# Patient Record
Sex: Female | Born: 1945 | Race: White | Hispanic: No | Marital: Married | Smoking: Never smoker
Health system: Southern US, Community
[De-identification: ages and names within clinical notes are randomized; demographics above are authoritative.]

## PROBLEM LIST (undated history)

## (undated) DIAGNOSIS — G43909 Migraine, unspecified, not intractable, without status migrainosus: Secondary | ICD-10-CM

## (undated) DIAGNOSIS — K219 Gastro-esophageal reflux disease without esophagitis: Secondary | ICD-10-CM

## (undated) DIAGNOSIS — G894 Chronic pain syndrome: Secondary | ICD-10-CM

## (undated) DIAGNOSIS — M542 Cervicalgia: Secondary | ICD-10-CM

## (undated) DIAGNOSIS — M170 Bilateral primary osteoarthritis of knee: Secondary | ICD-10-CM

## (undated) DIAGNOSIS — M25561 Pain in right knee: Secondary | ICD-10-CM

## (undated) DIAGNOSIS — F329 Major depressive disorder, single episode, unspecified: Secondary | ICD-10-CM

## (undated) DIAGNOSIS — F419 Anxiety disorder, unspecified: Secondary | ICD-10-CM

## (undated) DIAGNOSIS — M5030 Other cervical disc degeneration, unspecified cervical region: Secondary | ICD-10-CM

## (undated) DIAGNOSIS — G4700 Insomnia, unspecified: Secondary | ICD-10-CM

## (undated) DIAGNOSIS — M5136 Other intervertebral disc degeneration, lumbar region: Secondary | ICD-10-CM

## (undated) DIAGNOSIS — F411 Generalized anxiety disorder: Secondary | ICD-10-CM

## (undated) DIAGNOSIS — M810 Age-related osteoporosis without current pathological fracture: Secondary | ICD-10-CM

## (undated) DIAGNOSIS — I7300 Raynaud's syndrome without gangrene: Secondary | ICD-10-CM

## (undated) DIAGNOSIS — E782 Mixed hyperlipidemia: Secondary | ICD-10-CM

## (undated) DIAGNOSIS — R1310 Dysphagia, unspecified: Secondary | ICD-10-CM

## (undated) DIAGNOSIS — Z201 Contact with and (suspected) exposure to tuberculosis: Secondary | ICD-10-CM

## (undated) DIAGNOSIS — M1990 Unspecified osteoarthritis, unspecified site: Secondary | ICD-10-CM

## (undated) DIAGNOSIS — I10 Essential (primary) hypertension: Secondary | ICD-10-CM

## (undated) DIAGNOSIS — G8929 Other chronic pain: Secondary | ICD-10-CM

## (undated) DIAGNOSIS — I200 Unstable angina: Secondary | ICD-10-CM

## (undated) DIAGNOSIS — Z8739 Personal history of other diseases of the musculoskeletal system and connective tissue: Secondary | ICD-10-CM

## (undated) DIAGNOSIS — M549 Dorsalgia, unspecified: Secondary | ICD-10-CM

## (undated) DIAGNOSIS — H73819 Atrophic flaccid tympanic membrane, unspecified ear: Secondary | ICD-10-CM

## (undated) DIAGNOSIS — M25562 Pain in left knee: Secondary | ICD-10-CM

## (undated) DIAGNOSIS — H539 Unspecified visual disturbance: Secondary | ICD-10-CM

## (undated) DIAGNOSIS — F32A Depression, unspecified: Secondary | ICD-10-CM

## (undated) DIAGNOSIS — G43109 Migraine with aura, not intractable, without status migrainosus: Secondary | ICD-10-CM

## (undated) DIAGNOSIS — R0789 Other chest pain: Secondary | ICD-10-CM

## (undated) HISTORY — DX: Bilateral primary osteoarthritis of knee: M17.0

## (undated) HISTORY — DX: Essential (primary) hypertension: I10

## (undated) HISTORY — DX: Unspecified osteoarthritis, unspecified site: M19.90

## (undated) HISTORY — PX: CARDIAC CATHETERIZATION: SHX172

## (undated) HISTORY — DX: Mixed hyperlipidemia: E78.2

## (undated) HISTORY — PX: CORONARY ANGIOPLASTY: SHX604

## (undated) HISTORY — DX: Dysphagia, unspecified: R13.10

## (undated) HISTORY — DX: Cervicalgia: M54.2

## (undated) HISTORY — DX: Insomnia, unspecified: G47.00

## (undated) HISTORY — PX: JOINT REPLACEMENT: SHX530

## (undated) HISTORY — DX: Atrophic flaccid tympanic membrane, unspecified ear: H73.819

## (undated) HISTORY — DX: Pain in left knee: M25.562

## (undated) HISTORY — DX: Other chronic pain: G89.29

## (undated) HISTORY — PX: MASTOIDECTOMY: SHX711

## (undated) HISTORY — DX: Generalized anxiety disorder: F41.1

## (undated) HISTORY — DX: Pain in right knee: M25.561

## (undated) HISTORY — DX: Other chest pain: R07.89

## (undated) HISTORY — DX: Age-related osteoporosis without current pathological fracture: M81.0

## (undated) HISTORY — DX: Chronic pain syndrome: G89.4

## (undated) HISTORY — DX: Unspecified visual disturbance: H53.9

## (undated) HISTORY — DX: Other cervical disc degeneration, unspecified cervical region: M50.30

## (undated) HISTORY — DX: Migraine with aura, not intractable, without status migrainosus: G43.109

## (undated) HISTORY — DX: Unstable angina: I20.0

## (undated) HISTORY — DX: Other intervertebral disc degeneration, lumbar region: M51.36

---

## 2004-05-19 ENCOUNTER — Ambulatory Visit: Payer: Self-pay | Admitting: Family Medicine

## 2004-06-06 ENCOUNTER — Ambulatory Visit: Payer: Self-pay | Admitting: Family Medicine

## 2004-07-28 ENCOUNTER — Ambulatory Visit: Payer: Self-pay | Admitting: Family Medicine

## 2004-09-30 ENCOUNTER — Ambulatory Visit: Payer: Self-pay | Admitting: Family Medicine

## 2004-10-19 ENCOUNTER — Ambulatory Visit: Payer: Self-pay | Admitting: Family Medicine

## 2004-11-22 ENCOUNTER — Ambulatory Visit: Payer: Self-pay | Admitting: Family Medicine

## 2005-04-05 ENCOUNTER — Ambulatory Visit: Payer: Self-pay | Admitting: Family Medicine

## 2005-07-04 ENCOUNTER — Ambulatory Visit: Payer: Self-pay | Admitting: Family Medicine

## 2012-02-02 ENCOUNTER — Ambulatory Visit (HOSPITAL_COMMUNITY)
Admission: RE | Admit: 2012-02-02 | Discharge: 2012-02-02 | Disposition: A | Discharge status: Home / Self Care | Payer: MEDICARE | Source: Ambulatory Visit | Attending: Physical Medicine and Rehabilitation | Admitting: Physical Medicine and Rehabilitation

## 2012-02-02 ENCOUNTER — Other Ambulatory Visit (HOSPITAL_COMMUNITY): Payer: Self-pay | Admitting: Physical Medicine and Rehabilitation

## 2012-02-02 DIAGNOSIS — R52 Pain, unspecified: Secondary | ICD-10-CM

## 2012-02-02 DIAGNOSIS — M25569 Pain in unspecified knee: Secondary | ICD-10-CM | POA: Insufficient documentation

## 2013-10-28 DIAGNOSIS — M542 Cervicalgia: Secondary | ICD-10-CM | POA: Insufficient documentation

## 2013-10-28 DIAGNOSIS — M5136 Other intervertebral disc degeneration, lumbar region: Secondary | ICD-10-CM

## 2013-10-28 DIAGNOSIS — M51369 Other intervertebral disc degeneration, lumbar region without mention of lumbar back pain or lower extremity pain: Secondary | ICD-10-CM

## 2013-10-28 DIAGNOSIS — M25562 Pain in left knee: Secondary | ICD-10-CM | POA: Insufficient documentation

## 2013-10-28 DIAGNOSIS — M25561 Pain in right knee: Secondary | ICD-10-CM

## 2013-10-28 DIAGNOSIS — M545 Low back pain, unspecified: Secondary | ICD-10-CM

## 2013-10-28 DIAGNOSIS — G894 Chronic pain syndrome: Secondary | ICD-10-CM

## 2013-10-28 DIAGNOSIS — M5030 Other cervical disc degeneration, unspecified cervical region: Secondary | ICD-10-CM

## 2013-10-28 HISTORY — DX: Chronic pain syndrome: G89.4

## 2013-10-28 HISTORY — DX: Other cervical disc degeneration, unspecified cervical region: M50.30

## 2013-10-28 HISTORY — DX: Other intervertebral disc degeneration, lumbar region: M51.36

## 2013-10-28 HISTORY — DX: Cervicalgia: M54.2

## 2013-10-28 HISTORY — DX: Pain in right knee: M25.561

## 2013-10-28 HISTORY — DX: Other intervertebral disc degeneration, lumbar region without mention of lumbar back pain or lower extremity pain: M51.369

## 2013-10-28 HISTORY — DX: Low back pain, unspecified: M54.50

## 2014-12-21 ENCOUNTER — Other Ambulatory Visit (HOSPITAL_COMMUNITY): Payer: Self-pay | Admitting: Physical Medicine and Rehabilitation

## 2014-12-21 ENCOUNTER — Ambulatory Visit (HOSPITAL_COMMUNITY)
Admission: RE | Admit: 2014-12-21 | Discharge: 2014-12-21 | Disposition: A | Discharge status: Home / Self Care | Payer: Medicare Other | Source: Ambulatory Visit | Attending: Physical Medicine and Rehabilitation | Admitting: Physical Medicine and Rehabilitation

## 2014-12-21 DIAGNOSIS — M869 Osteomyelitis, unspecified: Secondary | ICD-10-CM

## 2014-12-21 DIAGNOSIS — M170 Bilateral primary osteoarthritis of knee: Secondary | ICD-10-CM | POA: Diagnosis present

## 2015-10-18 DIAGNOSIS — G4700 Insomnia, unspecified: Secondary | ICD-10-CM

## 2015-10-18 DIAGNOSIS — M810 Age-related osteoporosis without current pathological fracture: Secondary | ICD-10-CM

## 2015-10-18 DIAGNOSIS — H73819 Atrophic flaccid tympanic membrane, unspecified ear: Secondary | ICD-10-CM | POA: Insufficient documentation

## 2015-10-18 DIAGNOSIS — G8929 Other chronic pain: Secondary | ICD-10-CM

## 2015-10-18 DIAGNOSIS — M1990 Unspecified osteoarthritis, unspecified site: Secondary | ICD-10-CM

## 2015-10-18 DIAGNOSIS — I10 Essential (primary) hypertension: Secondary | ICD-10-CM | POA: Insufficient documentation

## 2015-10-18 DIAGNOSIS — F411 Generalized anxiety disorder: Secondary | ICD-10-CM

## 2015-10-18 HISTORY — DX: Age-related osteoporosis without current pathological fracture: M81.0

## 2015-10-18 HISTORY — DX: Essential (primary) hypertension: I10

## 2015-10-18 HISTORY — DX: Other chronic pain: G89.29

## 2015-10-18 HISTORY — DX: Generalized anxiety disorder: F41.1

## 2015-10-18 HISTORY — DX: Insomnia, unspecified: G47.00

## 2015-10-18 HISTORY — DX: Unspecified osteoarthritis, unspecified site: M19.90

## 2015-10-18 HISTORY — DX: Atrophic flaccid tympanic membrane, unspecified ear: H73.819

## 2016-11-01 ENCOUNTER — Other Ambulatory Visit: Payer: Self-pay | Admitting: Orthopedic Surgery

## 2016-11-13 ENCOUNTER — Encounter (HOSPITAL_COMMUNITY)
Admission: RE | Admit: 2016-11-13 | Discharge: 2016-11-13 | Disposition: A | Discharge status: Home / Self Care | Payer: Medicare Other | Source: Ambulatory Visit | Attending: Orthopedic Surgery | Admitting: Orthopedic Surgery

## 2016-11-13 ENCOUNTER — Encounter (HOSPITAL_COMMUNITY): Payer: Self-pay

## 2016-11-13 DIAGNOSIS — Z0183 Encounter for blood typing: Secondary | ICD-10-CM | POA: Diagnosis not present

## 2016-11-13 DIAGNOSIS — I10 Essential (primary) hypertension: Secondary | ICD-10-CM | POA: Diagnosis not present

## 2016-11-13 DIAGNOSIS — M170 Bilateral primary osteoarthritis of knee: Secondary | ICD-10-CM | POA: Diagnosis not present

## 2016-11-13 DIAGNOSIS — Z01812 Encounter for preprocedural laboratory examination: Secondary | ICD-10-CM | POA: Diagnosis present

## 2016-11-13 HISTORY — DX: Anxiety disorder, unspecified: F41.9

## 2016-11-13 HISTORY — DX: Gastro-esophageal reflux disease without esophagitis: K21.9

## 2016-11-13 HISTORY — DX: Unspecified osteoarthritis, unspecified site: M19.90

## 2016-11-13 HISTORY — DX: Essential (primary) hypertension: I10

## 2016-11-13 LAB — BASIC METABOLIC PANEL
ANION GAP: 8 (ref 5–15)
BUN: 15 mg/dL (ref 6–20)
CHLORIDE: 102 mmol/L (ref 101–111)
CO2: 29 mmol/L (ref 22–32)
Calcium: 9.6 mg/dL (ref 8.9–10.3)
Creatinine, Ser: 0.68 mg/dL (ref 0.44–1.00)
Glucose, Bld: 107 mg/dL — ABNORMAL HIGH (ref 65–99)
POTASSIUM: 4 mmol/L (ref 3.5–5.1)
SODIUM: 139 mmol/L (ref 135–145)

## 2016-11-13 LAB — CBC WITH DIFFERENTIAL/PLATELET
Basophils Absolute: 0.1 10*3/uL (ref 0.0–0.1)
Basophils Relative: 1 %
Eosinophils Absolute: 0.1 10*3/uL (ref 0.0–0.7)
Eosinophils Relative: 1 %
HEMATOCRIT: 45.8 % (ref 36.0–46.0)
HEMOGLOBIN: 15 g/dL (ref 12.0–15.0)
LYMPHS ABS: 3.3 10*3/uL (ref 0.7–4.0)
LYMPHS PCT: 35 %
MCH: 30.1 pg (ref 26.0–34.0)
MCHC: 32.8 g/dL (ref 30.0–36.0)
MCV: 92 fL (ref 78.0–100.0)
Monocytes Absolute: 0.4 10*3/uL (ref 0.1–1.0)
Monocytes Relative: 4 %
NEUTROS ABS: 5.5 10*3/uL (ref 1.7–7.7)
NEUTROS PCT: 59 %
Platelets: 351 10*3/uL (ref 150–400)
RBC: 4.98 MIL/uL (ref 3.87–5.11)
RDW: 12.8 % (ref 11.5–15.5)
WBC: 9.3 10*3/uL (ref 4.0–10.5)

## 2016-11-13 LAB — URINALYSIS, ROUTINE W REFLEX MICROSCOPIC
Bilirubin Urine: NEGATIVE
Glucose, UA: NEGATIVE mg/dL
Hgb urine dipstick: NEGATIVE
Ketones, ur: NEGATIVE mg/dL
LEUKOCYTES UA: NEGATIVE
NITRITE: NEGATIVE
PH: 8 (ref 5.0–8.0)
Protein, ur: NEGATIVE mg/dL
SPECIFIC GRAVITY, URINE: 1.005 (ref 1.005–1.030)

## 2016-11-13 LAB — RAPID URINE DRUG SCREEN, HOSP PERFORMED
AMPHETAMINES: NOT DETECTED
Barbiturates: NOT DETECTED
Benzodiazepines: NOT DETECTED
Cocaine: NOT DETECTED
Opiates: POSITIVE — AB
Tetrahydrocannabinol: NOT DETECTED

## 2016-11-13 LAB — PROTIME-INR
INR: 0.91
Prothrombin Time: 12.2 seconds (ref 11.4–15.2)

## 2016-11-13 LAB — SURGICAL PCR SCREEN
MRSA, PCR: NEGATIVE
STAPHYLOCOCCUS AUREUS: NEGATIVE

## 2016-11-13 LAB — APTT: APTT: 30 s (ref 24–36)

## 2016-11-13 LAB — ABO/RH: ABO/RH(D): O NEG

## 2016-11-13 LAB — PREPARE RBC (CROSSMATCH)

## 2016-11-13 NOTE — Progress Notes (Signed)
NOTIFIED KATHY BLOOM AT DR. Wadie Lessen OFFICE THAT PATIENT ID JEHOVAS WITNESS BUT STATED SHE WOULD ACCEPT BLOOD TRANFUSION  BUT ONLY IF "LIFE OR DEATH".  KATHY STATED SHE WOULD LET DR. Turner Daniels KNOW.

## 2016-11-13 NOTE — Pre-Procedure Instructions (Signed)
Brandy Cervantes  11/13/2016      [facility] - [place], [place] - [address] [place] [address] Phone: [phone] Fax: [phone]    Your procedure is scheduled on    Wednesday  11/22/16  Report to [facility] Admitting at 1045 A.M.  Call this number if you have problems the morning of surgery:  [phone]   Remember:  Do not eat food or drink liquids after midnight.  Take these medicines the morning of surgery with A SIP OF WATER   AMLODIPINE (NORVASC), HYDROCODONE IF NEEDED, PAROXETINE (PAXIL), RANITIDINE (ZANTAC)         (STOP 7 DAYS PRIOR TO SURGERY- ASPIRIN OR ASPIRIN PRODUCTS, DICLOFENAC/ VOLTAREN GEL, NABUMETONE/ RELAFEN, IBUPROFEN/ ADVIL/ MOTRIN, GOODY POWDERS, BC'S. HERBAL MEDICINES)   Do not wear jewelry, make-up or nail polish.  Do not wear lotions, powders, or perfumes, or deoderant.  Do not shave 48 hours prior to surgery.  Men may shave face and neck.  Do not bring valuables to the hospital.  [facility] is not responsible for any belongings or valuables.  Contacts, dentures or bridgework may not be worn into surgery.  Leave your suitcase in the car.  After surgery it may be brought to your room.  For patients admitted to the hospital, discharge time will be determined by your treatment team.  Patients discharged the day of surgery will not be allowed to drive home.   Name and phone number of your driver:    Special instructions:  [place] - Preparing for Surgery  Before surgery, you can play an important role.  Because skin is not sterile, your skin needs to be as free of germs as possible.  You can reduce the number of germs on you skin by washing with CHG (chlorahexidine gluconate) soap before surgery.  CHG is an antiseptic cleaner which kills germs and bonds with the skin to continue killing germs even after washing.  Please DO NOT use if you have an allergy to CHG or antibacterial soaps.  If your skin becomes  reddened/irritated stop using the CHG and inform your nurse when you arrive at Short Stay.  Do not shave (including legs and underarms) for at least 48 hours prior to the first CHG shower.  You may shave your face.  Please follow these instructions carefully:   1.  Shower with CHG Soap the night before surgery and the                                morning of Surgery.  2.  If you choose to wash your hair, wash your hair first as usual with your       normal shampoo.  3.  After you shampoo, rinse your hair and body thoroughly to remove the                      Shampoo.  4.  Use CHG as you would any other liquid soap.  You can apply chg directly       to the skin and wash gently with scrungie or a clean washcloth.  5.  Apply the CHG Soap to your body ONLY FROM THE NECK DOWN.        Do not use on open wounds or open sores.  Avoid contact with your eyes,       ears, mouth and  genitals (private parts).  Wash genitals (private parts)       with your normal soap.  6.  Wash thoroughly, paying special attention to the area where your surgery        will be performed.  7.  Thoroughly rinse your body with warm water from the neck down.  8.  DO NOT shower/wash with your normal soap after using and rinsing off       the CHG Soap.  9.  Pat yourself dry with a clean towel.            10.  Wear clean pajamas.            11.  Place clean sheets on your bed the night of your first shower and do not        sleep with pets.  Day of Surgery  Do not apply any lotions/deoderants the morning of surgery.  Please wear clean clothes to the hospital/surgery center.    Please read over the following fact sheets that you were given. Total Joint Packet, MRSA Information and Surgical Site Infection Prevention

## 2016-11-17 DIAGNOSIS — M170 Bilateral primary osteoarthritis of knee: Secondary | ICD-10-CM | POA: Diagnosis present

## 2016-11-17 HISTORY — DX: Bilateral primary osteoarthritis of knee: M17.0

## 2016-11-21 MED ORDER — TRANEXAMIC ACID 1000 MG/10ML IV SOLN
2000.0000 mg | INTRAVENOUS | Status: DC
  Filled 2016-11-21: qty 20

## 2016-11-21 MED ORDER — BUPIVACAINE LIPOSOME 1.3 % IJ SUSP
20.0000 mL | Freq: Once | INTRAMUSCULAR | Status: DC
  Filled 2016-11-21: qty 20

## 2016-11-21 MED ORDER — TRANEXAMIC ACID 1000 MG/10ML IV SOLN
1000.0000 mg | INTRAVENOUS | Status: AC
  Administered 2016-11-22: 1000 mg via INTRAVENOUS
  Filled 2016-11-21: qty 10

## 2016-11-21 NOTE — H&P (Signed)
TOTAL KNEE ADMISSION H&P  Patient is being admitted for bilaterally total knee arthroplasty.  Subjective:  Chief Complaint:bilaterally knee pain.  HPI: Brandy Cervantes, 71 y.o. female, has a history of pain and functional disability in the bilaterally knee due to arthritis and has failed non-surgical conservative treatments for greater than 12 weeks to includeNSAID's and/or analgesics, corticosteriod injections, viscosupplementation injections, flexibility and strengthening excercises, use of assistive devices, weight reduction as appropriate and activity modification.  Onset of symptoms was gradual, starting 8 years ago with gradually worsening course since that time. The patient noted no past surgery on the bilaterally knee(s).  Patient currently rates pain in the bilaterally knee(s) at 10 out of 10 with activity. Patient has night pain, worsening of pain with activity and weight bearing, pain that interferes with activities of daily living, pain with passive range of motion and crepitus.  Patient has evidence of subchondral sclerosis and joint space narrowing by imaging studies.  There is no active infection.  Patient Active Problem List   Diagnosis Date Noted  . Bilateral primary osteoarthritis of knee 11/17/2016   Past Medical History:  Diagnosis Date  . Anxiety   . Arthritis   . GERD (gastroesophageal reflux disease)   . Headache    HX MIGRAINES  . Hypertension     Past Surgical History:  Procedure Laterality Date  . MASTOIDECTOMY     AGE 39  RT SIDE    No prescriptions prior to admission.   Allergies  Allergen Reactions  . Aspirin Other (See Comments)    Stomach pain    Social History  Substance Use Topics  . Smoking status: Never Smoker  . Smokeless tobacco: Not on file  . Alcohol use No    No family history on file.   Review of Systems  Constitutional: Negative.   HENT: Negative.   Eyes: Negative.   Respiratory: Negative.   Cardiovascular: Negative.    Gastrointestinal: Negative.   Genitourinary: Negative.   Musculoskeletal: Positive for joint pain.  Skin: Negative.   Neurological: Negative.   Endo/Heme/Allergies: Negative.   Psychiatric/Behavioral: The patient is nervous/anxious.     Objective:  Physical Exam  Constitutional: She is oriented to person, place, and time. She appears well-developed and well-nourished.  HENT:  Head: Normocephalic and atraumatic.  Eyes: Pupils are equal, round, and reactive to light.  Neck: Normal range of motion. Neck supple.  Cardiovascular: Intact distal pulses.   Respiratory: Effort normal.  Musculoskeletal: She exhibits tenderness.  the patient has severe tenderness with palpation over the medial compartment of bilateral knees.  She has a range from 5 to 120 on the right and 0-120 on the left.  No noticeable effusion.  No erythema or warmth.  Obvious crepitus with range of motion.  Calves are soft and nontender.  She is neurovascularly intact distally.  Neurological: She is alert and oriented to person, place, and time.  Skin: Skin is warm and dry.  Psychiatric: She has a normal mood and affect. Her behavior is normal. Judgment and thought content normal.    Vital signs in last 24 hours:    Labs:   Estimated body mass index is 26.86 kg/m as calculated from the following:   Height as of 11/13/16: 5' 1.5" (1.562 m).   Weight as of 11/13/16: 65.5 kg (144 lb 8 oz).   Imaging Review Plain radiographs demonstrate bilateral AP weightbearing, bilateral Rosenberg, lateral sunrise views of bilateral knees are taken and reviewed in office today.  No fracture dislocation subluxation  or tumors identified.  She does have obvious end-stage arthritis medial compartment bilateral knees with subchondral sclerosis.  Assessment/Plan:  End stage arthritis, bilaterally knee   The patient history, physical examination, clinical judgment of the provider and imaging studies are consistent with end stage  degenerative joint disease of the bilaterally knee(s) and total knee arthroplasty is deemed medically necessary. The treatment options including medical management, injection therapy arthroscopy and arthroplasty were discussed at length. The risks and benefits of total knee arthroplasty were presented and reviewed. The risks due to aseptic loosening, infection, stiffness, patella tracking problems, thromboembolic complications and other imponderables were discussed. The patient acknowledged the explanation, agreed to proceed with the plan and consent was signed. Patient is being admitted for inpatient treatment for surgery, pain control, PT, OT, prophylactic antibiotics, VTE prophylaxis, progressive ambulation and ADL's and discharge planning. The patient is planning to be discharged to inpatient rehab.

## 2016-11-22 ENCOUNTER — Encounter (HOSPITAL_COMMUNITY): Payer: Self-pay | Admitting: *Deleted

## 2016-11-22 ENCOUNTER — Inpatient Hospital Stay (HOSPITAL_COMMUNITY): Payer: Medicare Other | Admitting: Anesthesiology

## 2016-11-22 ENCOUNTER — Inpatient Hospital Stay (HOSPITAL_COMMUNITY)
Admission: RE | Admit: 2016-11-22 | Discharge: 2016-11-25 | DRG: 462 | Disposition: A | Discharge status: Skilled Nursing Facility | Payer: Medicare Other | Source: Ambulatory Visit | Attending: Orthopedic Surgery | Admitting: Orthopedic Surgery

## 2016-11-22 ENCOUNTER — Encounter (HOSPITAL_COMMUNITY)
Admission: RE | Disposition: A | Discharge status: Skilled Nursing Facility | Payer: Self-pay | Source: Ambulatory Visit | Attending: Orthopedic Surgery

## 2016-11-22 ENCOUNTER — Inpatient Hospital Stay (HOSPITAL_COMMUNITY): Payer: Medicare Other | Admitting: Emergency Medicine

## 2016-11-22 DIAGNOSIS — G8929 Other chronic pain: Secondary | ICD-10-CM | POA: Diagnosis present

## 2016-11-22 DIAGNOSIS — Z888 Allergy status to other drugs, medicaments and biological substances status: Secondary | ICD-10-CM | POA: Diagnosis not present

## 2016-11-22 DIAGNOSIS — Z201 Contact with and (suspected) exposure to tuberculosis: Secondary | ICD-10-CM | POA: Diagnosis present

## 2016-11-22 DIAGNOSIS — I10 Essential (primary) hypertension: Secondary | ICD-10-CM | POA: Diagnosis present

## 2016-11-22 DIAGNOSIS — Z886 Allergy status to analgesic agent status: Secondary | ICD-10-CM | POA: Diagnosis not present

## 2016-11-22 DIAGNOSIS — I7300 Raynaud's syndrome without gangrene: Secondary | ICD-10-CM | POA: Diagnosis present

## 2016-11-22 DIAGNOSIS — D62 Acute posthemorrhagic anemia: Secondary | ICD-10-CM | POA: Diagnosis not present

## 2016-11-22 DIAGNOSIS — M170 Bilateral primary osteoarthritis of knee: Principal | ICD-10-CM | POA: Diagnosis present

## 2016-11-22 DIAGNOSIS — K219 Gastro-esophageal reflux disease without esophagitis: Secondary | ICD-10-CM | POA: Diagnosis present

## 2016-11-22 DIAGNOSIS — E871 Hypo-osmolality and hyponatremia: Secondary | ICD-10-CM | POA: Diagnosis not present

## 2016-11-22 DIAGNOSIS — Z23 Encounter for immunization: Secondary | ICD-10-CM | POA: Diagnosis present

## 2016-11-22 DIAGNOSIS — F419 Anxiety disorder, unspecified: Secondary | ICD-10-CM | POA: Diagnosis present

## 2016-11-22 DIAGNOSIS — M545 Low back pain: Secondary | ICD-10-CM | POA: Diagnosis present

## 2016-11-22 DIAGNOSIS — Z79891 Long term (current) use of opiate analgesic: Secondary | ICD-10-CM | POA: Diagnosis not present

## 2016-11-22 HISTORY — DX: Contact with and (suspected) exposure to tuberculosis: Z20.1

## 2016-11-22 HISTORY — PX: TOTAL KNEE ARTHROPLASTY: SHX125

## 2016-11-22 HISTORY — DX: Personal history of other diseases of the musculoskeletal system and connective tissue: Z87.39

## 2016-11-22 SURGERY — ARTHROPLASTY, KNEE, BILATERAL, TOTAL
Anesthesia: Spinal | Site: Knee | Laterality: Bilateral

## 2016-11-22 MED ORDER — DIPHENHYDRAMINE HCL 12.5 MG/5ML PO ELIX: 12.5000 mg | ORAL_SOLUTION | ORAL | Status: DC | PRN

## 2016-11-22 MED ORDER — MIDAZOLAM HCL 5 MG/5ML IJ SOLN
INTRAMUSCULAR | Status: DC | PRN
  Administered 2016-11-22: 2 mg via INTRAVENOUS

## 2016-11-22 MED ORDER — ONDANSETRON HCL 4 MG/2ML IJ SOLN
INTRAMUSCULAR | Status: AC
  Filled 2016-11-22: qty 2

## 2016-11-22 MED ORDER — KCL IN DEXTROSE-NACL 20-5-0.45 MEQ/L-%-% IV SOLN
INTRAVENOUS | Status: DC
  Administered 2016-11-22 – 2016-11-23 (×3): via INTRAVENOUS
  Filled 2016-11-22 (×3): qty 1000

## 2016-11-22 MED ORDER — OXYCODONE HCL 5 MG PO TABS: 5.0000 mg | ORAL_TABLET | Freq: Once | ORAL | Status: DC | PRN

## 2016-11-22 MED ORDER — CHLORHEXIDINE GLUCONATE 4 % EX LIQD: 60.0000 mL | Freq: Once | CUTANEOUS | Status: DC

## 2016-11-22 MED ORDER — FAMOTIDINE 20 MG PO TABS
20.0000 mg | ORAL_TABLET | Freq: Two times a day (BID) | ORAL | Status: DC
Start: 2016-11-22 — End: 2016-11-25
  Administered 2016-11-22 – 2016-11-25 (×6): 20 mg via ORAL
  Filled 2016-11-22 (×6): qty 1

## 2016-11-22 MED ORDER — MIDAZOLAM HCL 5 MG/5ML IJ SOLN: INTRAMUSCULAR | Status: DC | PRN

## 2016-11-22 MED ORDER — METOCLOPRAMIDE HCL 5 MG PO TABS: 5.0000 mg | ORAL_TABLET | Freq: Three times a day (TID) | ORAL | Status: DC | PRN

## 2016-11-22 MED ORDER — ASPIRIN EC 325 MG PO TBEC: 325.0000 mg | DELAYED_RELEASE_TABLET | Freq: Every day | ORAL | Status: DC

## 2016-11-22 MED ORDER — FLUTICASONE PROPIONATE 50 MCG/ACT NA SUSP
1.0000 | Freq: Every day | NASAL | Status: DC
  Administered 2016-11-22 – 2016-11-24 (×3): 1 via NASAL
  Filled 2016-11-22: qty 16

## 2016-11-22 MED ORDER — AMLODIPINE BESYLATE 10 MG PO TABS
10.0000 mg | ORAL_TABLET | Freq: Every day | ORAL | Status: DC
  Administered 2016-11-23 – 2016-11-25 (×2): 10 mg via ORAL
  Filled 2016-11-22 (×3): qty 1

## 2016-11-22 MED ORDER — HYDROMORPHONE HCL 1 MG/ML IJ SOLN
INTRAMUSCULAR | Status: AC
  Filled 2016-11-22: qty 0.5

## 2016-11-22 MED ORDER — OXYCODONE HCL 5 MG/5ML PO SOLN: 5.0000 mg | Freq: Once | ORAL | Status: DC | PRN

## 2016-11-22 MED ORDER — DOCUSATE SODIUM 100 MG PO CAPS
100.0000 mg | ORAL_CAPSULE | Freq: Two times a day (BID) | ORAL | Status: DC
  Administered 2016-11-22 – 2016-11-25 (×6): 100 mg via ORAL
  Filled 2016-11-22 (×6): qty 1

## 2016-11-22 MED ORDER — METOCLOPRAMIDE HCL 5 MG/ML IJ SOLN: 5.0000 mg | Freq: Three times a day (TID) | INTRAMUSCULAR | Status: DC | PRN

## 2016-11-22 MED ORDER — OXYCODONE HCL 5 MG PO TABS
5.0000 mg | ORAL_TABLET | ORAL | Status: DC | PRN
  Administered 2016-11-22 – 2016-11-25 (×12): 10 mg via ORAL
  Filled 2016-11-22 (×13): qty 2

## 2016-11-22 MED ORDER — 0.9 % SODIUM CHLORIDE (POUR BTL) OPTIME
TOPICAL | Status: DC | PRN
  Administered 2016-11-22: 1000 mL

## 2016-11-22 MED ORDER — MIDAZOLAM HCL 2 MG/2ML IJ SOLN
INTRAMUSCULAR | Status: AC
  Filled 2016-11-22: qty 2

## 2016-11-22 MED ORDER — ROPIVACAINE HCL 2 MG/ML IJ SOLN: 8.0000 mL/h | INTRAMUSCULAR | Status: DC

## 2016-11-22 MED ORDER — LIDOCAINE 2% (20 MG/ML) 5 ML SYRINGE
INTRAMUSCULAR | Status: AC
  Filled 2016-11-22: qty 5

## 2016-11-22 MED ORDER — ROPIVACAINE HCL 2 MG/ML IJ SOLN
8.0000 mL/h | INTRAMUSCULAR | Status: DC
  Administered 2016-11-22: 8 mL/h via EPIDURAL
  Administered 2016-11-22: 4 mL/h via EPIDURAL
  Filled 2016-11-22: qty 200

## 2016-11-22 MED ORDER — PROPOFOL 500 MG/50ML IV EMUL
INTRAVENOUS | Status: DC | PRN
  Administered 2016-11-22: 50 ug/kg/min via INTRAVENOUS

## 2016-11-22 MED ORDER — PHENYLEPHRINE 40 MCG/ML (10ML) SYRINGE FOR IV PUSH (FOR BLOOD PRESSURE SUPPORT)
PREFILLED_SYRINGE | INTRAVENOUS | Status: AC
  Filled 2016-11-22: qty 10

## 2016-11-22 MED ORDER — BUPIVACAINE IN DEXTROSE 0.75-8.25 % IT SOLN
INTRATHECAL | Status: DC | PRN
  Administered 2016-11-22: 2 mL via INTRATHECAL

## 2016-11-22 MED ORDER — DEXTROSE-NACL 5-0.45 % IV SOLN: INTRAVENOUS | Status: DC

## 2016-11-22 MED ORDER — HYDROMORPHONE HCL 1 MG/ML IJ SOLN
1.0000 mg | INTRAMUSCULAR | Status: DC | PRN
  Administered 2016-11-22 – 2016-11-23 (×8): 1 mg via INTRAVENOUS
  Filled 2016-11-22 (×8): qty 1

## 2016-11-22 MED ORDER — PHENOL 1.4 % MT LIQD: 1.0000 | OROMUCOSAL | Status: DC | PRN

## 2016-11-22 MED ORDER — SENNOSIDES-DOCUSATE SODIUM 8.6-50 MG PO TABS: 1.0000 | ORAL_TABLET | Freq: Every evening | ORAL | Status: DC | PRN

## 2016-11-22 MED ORDER — ONDANSETRON HCL 4 MG/2ML IJ SOLN: 4.0000 mg | Freq: Four times a day (QID) | INTRAMUSCULAR | Status: DC | PRN

## 2016-11-22 MED ORDER — METHOCARBAMOL 1000 MG/10ML IJ SOLN
500.0000 mg | Freq: Four times a day (QID) | INTRAVENOUS | Status: DC | PRN
  Filled 2016-11-22: qty 5

## 2016-11-22 MED ORDER — LIDOCAINE-EPINEPHRINE (PF) 1.5 %-1:200000 IJ SOLN
INTRAMUSCULAR | Status: DC | PRN
  Administered 2016-11-22: 5 mL via EPIDURAL

## 2016-11-22 MED ORDER — ASPIRIN EC 325 MG PO TBEC: 325.0000 mg | DELAYED_RELEASE_TABLET | Freq: Two times a day (BID) | ORAL | 0 refills | Status: DC

## 2016-11-22 MED ORDER — ALUM & MAG HYDROXIDE-SIMETH 200-200-20 MG/5ML PO SUSP: 30.0000 mL | ORAL | Status: DC | PRN

## 2016-11-22 MED ORDER — BISACODYL 5 MG PO TBEC: 5.0000 mg | DELAYED_RELEASE_TABLET | Freq: Every day | ORAL | Status: DC | PRN

## 2016-11-22 MED ORDER — FLEET ENEMA 7-19 GM/118ML RE ENEM: 1.0000 | ENEMA | Freq: Once | RECTAL | Status: DC | PRN

## 2016-11-22 MED ORDER — PAROXETINE HCL 10 MG PO TABS
5.0000 mg | ORAL_TABLET | Freq: Two times a day (BID) | ORAL | Status: DC
  Administered 2016-11-22 – 2016-11-25 (×6): 5 mg via ORAL
  Filled 2016-11-22 (×6): qty 0.5

## 2016-11-22 MED ORDER — ONDANSETRON HCL 4 MG PO TABS: 4.0000 mg | ORAL_TABLET | Freq: Four times a day (QID) | ORAL | Status: DC | PRN

## 2016-11-22 MED ORDER — LACTATED RINGERS IV SOLN
INTRAVENOUS | Status: DC
  Administered 2016-11-22 (×2): via INTRAVENOUS

## 2016-11-22 MED ORDER — FENTANYL CITRATE (PF) 100 MCG/2ML IJ SOLN
INTRAMUSCULAR | Status: AC
  Filled 2016-11-22: qty 2

## 2016-11-22 MED ORDER — PHENYLEPHRINE HCL 10 MG/ML IJ SOLN
INTRAMUSCULAR | Status: DC | PRN
  Administered 2016-11-22: 80 ug via INTRAVENOUS

## 2016-11-22 MED ORDER — PROPOFOL 10 MG/ML IV BOLUS
INTRAVENOUS | Status: DC | PRN
  Administered 2016-11-22: 20 mg via INTRAVENOUS
  Administered 2016-11-22: 30 mg via INTRAVENOUS
  Administered 2016-11-22: 20 mg via INTRAVENOUS

## 2016-11-22 MED ORDER — MENTHOL 3 MG MT LOZG: 1.0000 | LOZENGE | OROMUCOSAL | Status: DC | PRN

## 2016-11-22 MED ORDER — TRANEXAMIC ACID 1000 MG/10ML IV SOLN
2000.0000 mg | INTRAVENOUS | Status: DC
  Filled 2016-11-22: qty 20

## 2016-11-22 MED ORDER — ACETAMINOPHEN 650 MG RE SUPP: 650.0000 mg | Freq: Four times a day (QID) | RECTAL | Status: DC | PRN

## 2016-11-22 MED ORDER — CEFAZOLIN SODIUM-DEXTROSE 2-4 GM/100ML-% IV SOLN
2.0000 g | INTRAVENOUS | Status: AC
  Administered 2016-11-22: 2 g via INTRAVENOUS
  Filled 2016-11-22: qty 100

## 2016-11-22 MED ORDER — TRANEXAMIC ACID 1000 MG/10ML IV SOLN
1000.0000 mg | Freq: Once | INTRAVENOUS | Status: AC
  Administered 2016-11-22: 1000 mg via INTRAVENOUS
  Filled 2016-11-22: qty 10

## 2016-11-22 MED ORDER — METHOCARBAMOL 500 MG PO TABS: 500.0000 mg | ORAL_TABLET | Freq: Two times a day (BID) | ORAL | 0 refills | Status: DC

## 2016-11-22 MED ORDER — FENTANYL CITRATE (PF) 250 MCG/5ML IJ SOLN
INTRAMUSCULAR | Status: AC
  Filled 2016-11-22: qty 5

## 2016-11-22 MED ORDER — FENTANYL CITRATE (PF) 100 MCG/2ML IJ SOLN
INTRAMUSCULAR | Status: DC | PRN
  Administered 2016-11-22: 100 ug via INTRAVENOUS

## 2016-11-22 MED ORDER — ACETAMINOPHEN 325 MG PO TABS
650.0000 mg | ORAL_TABLET | Freq: Four times a day (QID) | ORAL | Status: DC | PRN
  Administered 2016-11-23 (×2): 650 mg via ORAL
  Filled 2016-11-22 (×2): qty 2

## 2016-11-22 MED ORDER — HYDROMORPHONE HCL 1 MG/ML IJ SOLN
0.2500 mg | INTRAMUSCULAR | Status: DC | PRN
Start: 2016-11-22 — End: 2016-11-22
  Administered 2016-11-22: 0.5 mg via INTRAVENOUS

## 2016-11-22 MED ORDER — TRANEXAMIC ACID 1000 MG/10ML IV SOLN
INTRAVENOUS | Status: DC | PRN
  Administered 2016-11-22: 2000 mg via TOPICAL

## 2016-11-22 MED ORDER — TRANEXAMIC ACID 1000 MG/10ML IV SOLN
2000.0000 mg | INTRAVENOUS | Status: AC
  Administered 2016-11-22: 2000 mg via TOPICAL
  Filled 2016-11-22: qty 20

## 2016-11-22 MED ORDER — METHOCARBAMOL 500 MG PO TABS
500.0000 mg | ORAL_TABLET | Freq: Four times a day (QID) | ORAL | Status: DC | PRN
  Administered 2016-11-22 – 2016-11-25 (×6): 500 mg via ORAL
  Filled 2016-11-22 (×6): qty 1

## 2016-11-22 MED ORDER — SODIUM CHLORIDE 0.9 % IR SOLN
Status: DC | PRN
  Administered 2016-11-22: 3000 mL

## 2016-11-22 MED ORDER — OXYCODONE-ACETAMINOPHEN 5-325 MG PO TABS: 1.0000 | ORAL_TABLET | ORAL | 0 refills | Status: DC | PRN

## 2016-11-22 SURGICAL SUPPLY — 66 items
BAG DECANTER FOR FLEXI CONT (MISCELLANEOUS) ×1 IMPLANT
BANDAGE ACE 4X5 VEL STRL LF (GAUZE/BANDAGES/DRESSINGS) ×2 IMPLANT
BANDAGE ACE 6X5 VEL STRL LF (GAUZE/BANDAGES/DRESSINGS) ×4 IMPLANT
BANDAGE ESMARK 6X9 LF (GAUZE/BANDAGES/DRESSINGS) ×1 IMPLANT
BLADE SAG 18X100X1.27 (BLADE) ×3 IMPLANT
BLADE SAW SGTL 13X75X1.27 (BLADE) ×2 IMPLANT
BNDG CMPR 9X6 STRL LF SNTH (GAUZE/BANDAGES/DRESSINGS) ×2
BNDG COHESIVE 6X5 TAN STRL LF (GAUZE/BANDAGES/DRESSINGS) ×2 IMPLANT
BNDG ESMARK 6X9 LF (GAUZE/BANDAGES/DRESSINGS) ×4
BOWL SMART MIX CTS (DISPOSABLE) ×4 IMPLANT
CAPT KNEE TOTAL 3 ATTUNE ×2 IMPLANT
CEMENT HV SMART SET (Cement) ×8 IMPLANT
COVER SURGICAL LIGHT HANDLE (MISCELLANEOUS) ×3 IMPLANT
CUFF TOURNIQUET SINGLE 34IN LL (TOURNIQUET CUFF) ×4 IMPLANT
DRAPE EXTREMITY BILATERAL (DRAPES) ×2 IMPLANT
DRAPE IMP U-DRAPE 54X76 (DRAPES) ×3 IMPLANT
DRAPE INCISE IOBAN 66X45 STRL (DRAPES) ×2 IMPLANT
DRAPE U-SHAPE 47X51 STRL (DRAPES) ×4 IMPLANT
DRSG AQUACEL AG ADV 3.5X10 (GAUZE/BANDAGES/DRESSINGS) ×2 IMPLANT
DURAPREP 26ML APPLICATOR (WOUND CARE) ×4 IMPLANT
ELECT REM PT RETURN 9FT ADLT (ELECTROSURGICAL) ×2
ELECTRODE REM PT RTRN 9FT ADLT (ELECTROSURGICAL) ×1 IMPLANT
EVACUATOR 1/8 PVC DRAIN (DRAIN) IMPLANT
GAUZE SPONGE 4X4 12PLY STRL LF (GAUZE/BANDAGES/DRESSINGS) IMPLANT
GLOVE BIO SURGEON STRL SZ7.5 (GLOVE) ×3 IMPLANT
GLOVE BIO SURGEON STRL SZ8.5 (GLOVE) ×2 IMPLANT
GLOVE BIOGEL PI IND STRL 8 (GLOVE) ×2 IMPLANT
GLOVE BIOGEL PI IND STRL 9 (GLOVE) ×1 IMPLANT
GLOVE BIOGEL PI INDICATOR 8 (GLOVE) ×2
GLOVE BIOGEL PI INDICATOR 9 (GLOVE) ×1
GOWN STRL REUS W/ TWL LRG LVL3 (GOWN DISPOSABLE) ×3 IMPLANT
GOWN STRL REUS W/ TWL XL LVL3 (GOWN DISPOSABLE) ×1 IMPLANT
GOWN STRL REUS W/TWL LRG LVL3 (GOWN DISPOSABLE) ×4
GOWN STRL REUS W/TWL XL LVL3 (GOWN DISPOSABLE) ×4
HANDPIECE INTERPULSE COAX TIP (DISPOSABLE) ×2
HOOD PEEL AWAY FACE SHEILD DIS (HOOD) ×5 IMPLANT
KIT BASIN OR (CUSTOM PROCEDURE TRAY) ×2 IMPLANT
KIT ROOM TURNOVER OR (KITS) ×2 IMPLANT
MANIFOLD NEPTUNE II (INSTRUMENTS) ×2 IMPLANT
NS IRRIG 1000ML POUR BTL (IV SOLUTION) ×2 IMPLANT
PACK TOTAL JOINT (CUSTOM PROCEDURE TRAY) ×2 IMPLANT
PACK UNIVERSAL I (CUSTOM PROCEDURE TRAY) ×2 IMPLANT
PAD ABD 8X10 STRL (GAUZE/BANDAGES/DRESSINGS) IMPLANT
PAD ARMBOARD 7.5X6 YLW CONV (MISCELLANEOUS) ×4 IMPLANT
PAD CAST 4YDX4 CTTN HI CHSV (CAST SUPPLIES) ×2 IMPLANT
PADDING CAST COTTON 4X4 STRL (CAST SUPPLIES)
PADDING CAST COTTON 6X4 STRL (CAST SUPPLIES) ×2 IMPLANT
SET HNDPC FAN SPRY TIP SCT (DISPOSABLE) IMPLANT
SPONGE LAP 18X18 X RAY DECT (DISPOSABLE) ×2 IMPLANT
STAPLER VISISTAT 35W (STAPLE) ×3 IMPLANT
STOCKINETTE IMPERVIOUS 9X36 MD (GAUZE/BANDAGES/DRESSINGS) ×3 IMPLANT
SUCTION FRAZIER HANDLE 10FR (MISCELLANEOUS) ×1
SUCTION TUBE FRAZIER 10FR DISP (MISCELLANEOUS) ×1 IMPLANT
SUT VIC AB 0 CT1 27 (SUTURE) ×4
SUT VIC AB 0 CT1 27XBRD ANBCTR (SUTURE) ×2 IMPLANT
SUT VIC AB 1 CTX 36 (SUTURE) ×4
SUT VIC AB 1 CTX36XBRD ANBCTR (SUTURE) ×2 IMPLANT
SUT VIC AB 2-0 CT1 27 (SUTURE) ×4
SUT VIC AB 2-0 CT1 TAPERPNT 27 (SUTURE) ×2 IMPLANT
SUT VIC AB 3-0 CT1 27 (SUTURE) ×4
SUT VIC AB 3-0 CT1 TAPERPNT 27 (SUTURE) IMPLANT
TOWEL OR 17X24 6PK STRL BLUE (TOWEL DISPOSABLE) ×2 IMPLANT
TOWEL OR 17X26 10 PK STRL BLUE (TOWEL DISPOSABLE) ×2 IMPLANT
TRAY FOLEY CATH SILVER 14FR (SET/KITS/TRAYS/PACK) ×1 IMPLANT
WATER STERILE IRR 1000ML POUR (IV SOLUTION) ×3 IMPLANT
YANKAUER SUCT BULB TIP NO VENT (SUCTIONS) ×1 IMPLANT

## 2016-11-22 NOTE — Transfer of Care (Signed)
Immediate Anesthesia Transfer of Care Note  Patient: Brandy Cervantes  Procedure(s) Performed: Procedure(s): TOTAL KNEE BILATERAL (Bilateral)  Patient Location: PACU  Anesthesia Type:MAC and Epidural  Level of Consciousness: awake, oriented and patient cooperative  Airway & Oxygen Therapy: Patient Spontanous Breathing and Patient connected to face mask oxygen  Post-op Assessment: Report given to RN and Post -op Vital signs reviewed and stable  Post vital signs: Reviewed  Last Vitals:  Vitals:   11/22/16 1122 11/22/16 1538  BP: 140/84   Pulse: 94   Resp: 18   Temp: 36.8 C (!) 36 C    Last Pain:  Vitals:   11/22/16 1122  TempSrc: Oral  PainSc:       Patients Stated Pain Goal: 3 (07/18/70 5366)  Complications: No apparent anesthesia complications

## 2016-11-22 NOTE — Progress Notes (Signed)
Orthopedic Tech Progress Note Patient Details:  Merilynn FinlandJanice Nase 04/23/1946 [id number]  Ortho Devices Ortho Device/Splint Location: (B) footsie rolls Ortho Device/Splint Interventions: Casandra DoffingOrdered   Dalyce Renne Craig 11/22/2016, 10:21 PM

## 2016-11-22 NOTE — Anesthesia Preprocedure Evaluation (Addendum)
Anesthesia Evaluation  Patient identified by MRN, date of birth, ID band Patient awake    Reviewed: Allergy & Precautions, NPO status , Patient's Chart, lab work & pertinent test results  Airway Mallampati: I  TM Distance: >3 FB Neck ROM: Full    Dental  (+) Teeth Intact, Chipped, Dental Advisory Given,    Pulmonary    breath sounds clear to auscultation       Cardiovascular hypertension, Pt. on medications (-) angina(-) Past MI and (-) CHF  Rhythm:Regular     Neuro/Psych  Headaches, Anxiety    GI/Hepatic Neg liver ROS, GERD  Medicated and Controlled,  Endo/Other  negative endocrine ROS  Renal/GU negative Renal ROS     Musculoskeletal  (+) Arthritis ,   Abdominal   Peds  Hematology negative hematology ROS (+)   Anesthesia Other Findings Chronic back pain and arthritis on chronic narcotics  Patient states she has Raynaud's disease in both feet.  Reproductive/Obstetrics                           Anesthesia Physical Anesthesia Plan  ASA: II  Anesthesia Plan: Combined Spinal and Epidural   Post-op Pain Management:    Induction:   Airway Management Planned: Natural Airway, Nasal Cannula and Simple Face Mask  Additional Equipment: None  Intra-op Plan:   Post-operative Plan:   Informed Consent: I have reviewed the patients History and Physical, chart, labs and discussed the procedure including the risks, benefits and alternatives for the proposed anesthesia with the patient or authorized representative who has indicated his/her understanding and acceptance.   Dental advisory given  Plan Discussed with: CRNA and Surgeon  Anesthesia Plan Comments:         Anesthesia Quick Evaluation

## 2016-11-22 NOTE — Interval H&P Note (Signed)
History and Physical Interval Note:  11/22/2016 12:07 PM  Brandy Cervantes  has presented today for surgery, with the diagnosis of BILATERAL KNEE OSTEOARTHRITIS  The various methods of treatment have been discussed with the patient and family. After consideration of risks, benefits and other options for treatment, the patient has consented to  Procedure(s): TOTAL KNEE BILATERAL (Bilateral) as a surgical intervention .  The patient's history has been reviewed, patient examined, no change in status, stable for surgery.  I have reviewed the patient's chart and labs.  Questions were answered to the patient's satisfaction.     Nestor LewandowskyOWAN,Shelene Krage J

## 2016-11-22 NOTE — Anesthesia Procedure Notes (Signed)
Epidural Patient location during procedure: OR Start time: 11/22/2016 1:33 PM End time: 11/22/2016 1:42 PM  Staffing Anesthesiologist: Val EagleMOSER, Rebbeca Sheperd Performed: anesthesiologist   Preanesthetic Checklist Completed: patient identified, surgical consent, pre-op evaluation, timeout performed, IV checked, risks and benefits discussed and monitors and equipment checked  Epidural Patient position: sitting Prep: ChloraPrep Patient monitoring: heart rate, cardiac monitor, continuous pulse ox and blood pressure Approach: midline Location: L4-L5 Injection technique: LOR saline  Needle:  Needle type: Tuohy  Needle gauge: 17 G Needle length: 9 cm Needle insertion depth: 7 cm Catheter type: closed end flexible Catheter size: 19 Gauge Catheter at skin depth: 13 cm Test dose: negative and 1.5% lidocaine with Epi 1:200 K  Assessment Sensory level: T6 Events: blood not aspirated, injection not painful, no injection resistance, negative IV test and no paresthesia  Additional Notes CSE preformed after LOR with Touhy. 25g Pencan inserted through Touhy with +CSF on aspiration. Spinal dose delivered with positive aspiration midway and at completion of spinal dose. Spinal needle removed and epidural catheter inserted. Reason for block:procedure for pain

## 2016-11-22 NOTE — Anesthesia Postprocedure Evaluation (Addendum)
Anesthesia Post Note  Patient: Brandy Cervantes  Procedure(s) Performed: Procedure(s) (LRB): TOTAL KNEE BILATERAL (Bilateral)  Patient location during evaluation: PACU Anesthesia Type: Combined Spinal/Epidural Level of consciousness: awake and alert Pain management: pain level controlled Vital Signs Assessment: post-procedure vital signs reviewed and stable Respiratory status: spontaneous breathing, nonlabored ventilation, respiratory function stable and patient connected to nasal cannula oxygen Cardiovascular status: stable Postop Assessment: no signs of nausea or vomiting Anesthetic complications: no       Last Vitals:  Vitals:   11/22/16 1815 11/22/16 1819  BP: 100/72   Pulse: 89 90  Resp: 12 (!) 21  Temp:  (!) 36 C    Last Pain:  Vitals:   11/22/16 1800  TempSrc:   PainSc: 7                  Axel Frisk

## 2016-11-22 NOTE — Discharge Instructions (Addendum)
INSTRUCTIONS AFTER JOINT REPLACEMENT  ° °o Remove items at home which could result in a fall. This includes throw rugs or furniture in walking pathways °o ICE to the affected joint every three hours while awake for 30 minutes at a time, for at least the first 3-5 days, and then as needed for pain and swelling.  Continue to use ice for pain and swelling. You may notice swelling that will progress down to the foot and ankle.  This is normal after surgery.  Elevate your leg when you are not up walking on it.   °o Continue to use the breathing machine you got in the hospital (incentive spirometer) which will help keep your temperature down.  It is common for your temperature to cycle up and down following surgery, especially at night when you are not up moving around and exerting yourself.  The breathing machine keeps your lungs expanded and your temperature down. ° ° °DIET:  As you were doing prior to hospitalization, we recommend a well-balanced diet. ° °DRESSING / WOUND CARE / SHOWERING ° °Keep the surgical dressing until follow up.  The dressing is water proof, so you can shower without any extra covering.  IF THE DRESSING FALLS OFF or the wound gets wet inside, change the dressing with sterile gauze.  Please use good hand washing techniques before changing the dressing.  Do not use any lotions or creams on the incision until instructed by your surgeon.   ° °ACTIVITY ° °o Increase activity slowly as tolerated, but follow the weight bearing instructions below.   °o No driving for 6 weeks or until further direction given by your physician.  You cannot drive while taking narcotics.  °o No lifting or carrying greater than 10 lbs. until further directed by your surgeon. °o Avoid periods of inactivity such as sitting longer than an hour when not asleep. This helps prevent blood clots.  °o You may return to work once you are authorized by your doctor.  ° ° ° °WEIGHT BEARING  ° °Weight bearing as tolerated with assist  device (walker, cane, etc) as directed, use it as long as suggested by your surgeon or therapist, typically at least 4-6 weeks. ° ° °EXERCISES ° °Results after joint replacement surgery are often greatly improved when you follow the exercise, range of motion and muscle strengthening exercises prescribed by your doctor. Safety measures are also important to protect the joint from further injury. Any time any of these exercises cause you to have increased pain or swelling, decrease what you are doing until you are comfortable again and then slowly increase them. If you have problems or questions, call your caregiver or physical therapist for advice.  ° °Rehabilitation is important following a joint replacement. After just a few days of immobilization, the muscles of the leg can become weakened and shrink (atrophy).  These exercises are designed to build up the tone and strength of the thigh and leg muscles and to improve motion. Often times heat used for twenty to thirty minutes before working out will loosen up your tissues and help with improving the range of motion but do not use heat for the first two weeks following surgery (sometimes heat can increase post-operative swelling).  ° °These exercises can be done on a training (exercise) mat, on the floor, on a table or on a bed. Use whatever works the best and is most comfortable for you.    Use music or television while you are exercising so that   the exercises are a pleasant break in your day. This will make your life better with the exercises acting as a break in your routine that you can look forward to.   Perform all exercises about fifteen times, three times per day or as directed.  You should exercise both the operative leg and the other leg as well. ° °Exercises include: °  °• Quad Sets - Tighten up the muscle on the front of the thigh (Quad) and hold for 5-10 seconds.   °• Straight Leg Raises - With your knee straight (if you were given a brace, keep it on),  lift the leg to 60 degrees, hold for 3 seconds, and slowly lower the leg.  Perform this exercise against resistance later as your leg gets stronger.  °• Leg Slides: Lying on your back, slowly slide your foot toward your buttocks, bending your knee up off the floor (only go as far as is comfortable). Then slowly slide your foot back down until your leg is flat on the floor again.  °• Angel Wings: Lying on your back spread your legs to the side as far apart as you can without causing discomfort.  °• Hamstring Strength:  Lying on your back, push your heel against the floor with your leg straight by tightening up the muscles of your buttocks.  Repeat, but this time bend your knee to a comfortable angle, and push your heel against the floor.  You may put a pillow under the heel to make it more comfortable if necessary.  ° °A rehabilitation program following joint replacement surgery can speed recovery and prevent re-injury in the future due to weakened muscles. Contact your doctor or a physical therapist for more information on knee rehabilitation.  ° ° °CONSTIPATION ° °Constipation is defined medically as fewer than three stools per week and severe constipation as less than one stool per week.  Even if you have a regular bowel pattern at home, your normal regimen is likely to be disrupted due to multiple reasons following surgery.  Combination of anesthesia, postoperative narcotics, change in appetite and fluid intake all can affect your bowels.  ° °YOU MUST use at least one of the following options; they are listed in order of increasing strength to get the job done.  They are all available over the counter, and you may need to use some, POSSIBLY even all of these options:   ° °Drink plenty of fluids (prune juice may be helpful) and high fiber foods °Colace 100 mg by mouth twice a day  °Senokot for constipation as directed and as needed Dulcolax (bisacodyl), take with full glass of water  °Miralax (polyethylene glycol)  once or twice a day as needed. ° °If you have tried all these things and are unable to have a bowel movement in the first 3-4 days after surgery call either your surgeon or your primary doctor.   ° °If you experience loose stools or diarrhea, hold the medications until you stool forms back up.  If your symptoms do not get better within 1 week or if they get worse, check with your doctor.  If you experience "the worst abdominal pain ever" or develop nausea or vomiting, please contact the office immediately for further recommendations for treatment. ° ° °ITCHING:  If you experience itching with your medications, try taking only a single pain pill, or even half a pain pill at a time.  You can also use Benadryl over the counter for itching or also to   help with sleep.  ° °TED HOSE STOCKINGS:  Use stockings on both legs until for at least 2 weeks or as directed by physician office. They may be removed at night for sleeping. ° °MEDICATIONS:  See your medication summary on the “After Visit Summary” that nursing will review with you.  You may have some home medications which will be placed on hold until you complete the course of blood thinner medication.  It is important for you to complete the blood thinner medication as prescribed. ° °PRECAUTIONS:  If you experience chest pain or shortness of breath - call 911 immediately for transfer to the hospital emergency department.  ° °If you develop a fever greater that 101 F, purulent drainage from wound, increased redness or drainage from wound, foul odor from the wound/dressing, or calf pain - CONTACT YOUR SURGEON.   °                                                °FOLLOW-UP APPOINTMENTS:  If you do not already have a post-op appointment, please call the office for an appointment to be seen by your surgeon.  Guidelines for how soon to be seen are listed in your “After Visit Summary”, but are typically between 1-4 weeks after surgery. ° °OTHER INSTRUCTIONS:  ° °Knee  Replacement:  Do not place pillow under knee, focus on keeping the knee straight while resting. CPM instructions: 0-90 degrees, 2 hours in the morning, 2 hours in the afternoon, and 2 hours in the evening. Place foam block, curve side up under heel at all times except when in CPM or when walking.  DO NOT modify, tear, cut, or change the foam block in any way. ° °MAKE SURE YOU:  °• Understand these instructions.  °• Get help right away if you are not doing well or get worse.  ° ° °Thank you for letting us be a part of your medical care team.  It is a privilege we respect greatly.  We hope these instructions will help you stay on track for a fast and full recovery!  ° °Information on my medicine - ELIQUIS® (apixaban) ° °This medication education was reviewed with me or my healthcare representative as part of my discharge preparation.  The pharmacist that spoke with me during my hospital stay was:  Millen, Jessica Brown, RPH ° °Why was Eliquis® prescribed for you? °Eliquis® was prescribed for you to reduce the risk of blood clots forming after orthopedic surgery.   ° °What do You need to know about Eliquis®? °Take your Eliquis® TWICE DAILY - one tablet in the morning and one tablet in the evening with or without food.  It would be best to take the dose about the same time each day. ° °If you have difficulty swallowing the tablet whole please discuss with your pharmacist how to take the medication safely. ° °Take Eliquis® exactly as prescribed by your doctor and DO NOT stop taking Eliquis® without talking to the doctor who prescribed the medication.  Stopping without other medication to take the place of Eliquis® may increase your risk of developing a clot. ° °After discharge, you should have regular check-up appointments with your healthcare provider that is prescribing your Eliquis®. ° °What do you do if you miss a dose? °If a dose of ELIQUIS® is not taken at the scheduled time, take   it as soon as possible on the same  day and twice-daily administration should be resumed.  The dose should not be doubled to make up for a missed dose.  Do not take more than one tablet of ELIQUIS at the same time. ° °Important Safety Information °A possible side effect of Eliquis® is bleeding. You should call your healthcare provider right away if you experience any of the following: °? Bleeding from an injury or your nose that does not stop. °? Unusual colored urine (red or dark brown) or unusual colored stools (red or black). °? Unusual bruising for unknown reasons. °? A serious fall or if you hit your head (even if there is no bleeding). ° °Some medicines may interact with Eliquis® and might increase your risk of bleeding or clotting while on Eliquis®. To help avoid this, consult your healthcare provider or pharmacist prior to using any new prescription or non-prescription medications, including herbals, vitamins, non-steroidal anti-inflammatory drugs (NSAIDs) and supplements. ° °This website has more information on Eliquis® (apixaban): http://www.eliquis.com/eliquis/home ° ° °

## 2016-11-22 NOTE — Op Note (Signed)
PATIENT ID:      Brandy Cervantes  MRN:     [id number] DOB/AGE:    [id number] / 71 y.o.       OPERATIVE REPORT    DATE OF PROCEDURE:  11/22/2016       PREOPERATIVE DIAGNOSIS:   BILATERAL KNEE OSTEOARTHRITIS      Estimated body mass index is 26.77 kg/m as calculated from the following:   Height as of this encounter: 5' 1.5" (1.562 m).   Weight as of this encounter: 65.3 kg (144 lb).                                                        POSTOPERATIVE DIAGNOSIS:   BILATERAL KNEE OSTEOARTHRITIS                                                                      PROCEDURE:  Procedure(s): TOTAL KNEE BILATERAL Using DepuyAttune RP implants #20f Femur, #6Tibia, 5mm mm Attune RP bearing, 38 Patella     SURGEON: Birgit Nowling J    ASSISTANT:   Eric K. [employer]   (Present and scrubbed throughout the case, critical for assistance with exposure, retraction, instrumentation, and closure.)         ANESTHESIA: Epidural, 20cc Exparel, 50cc 0.25% Marcaine  EBL: 300cc each side   FLUID REPLACEMENT: 1800 crystalloid  TOURNIQUET TIME: each side  Drains: None  Tranexamic Acid: 1gm IV, 2gm topical  COMPLICATIONS:  None         INDICATIONS FOR PROCEDURE: The patient has  BILATERAL KNEE OSTEOARTHRITIS, Var deformities, XR shows bone on bone arthritis, lateral subluxation of tibia. Patient has failed all conservative measures including anti-inflammatory medicines, narcotics, attempts at exercise and weight loss, cortisone injections and viscosupplementation.  Risks and benefits of surgery have been discussed, questions answered.   DESCRIPTION OF PROCEDURE: The patient identified by armband, received  IV antibiotics, in the holding area at [facility]. Patient taken to the operating room, appropriate anesthetic  monitors were attached, and Epidural anesthesia was  induced. Tourniquet  applied high to the operative thigh. Lateral post and foot positioner  applied to the table, the B lower  extremity was then prepped and draped  in usual sterile fashion from the toes to the tourniquet. Time-out procedure was performed. We began the operation, with the R knee flexed 120 degrees, by making the anterior midline incision starting at handbreadth above the patella going over the patella 1 cm medial to and 4 cm distal to the tibial tubercle. Small bleeders in the skin and the  subcutaneous tissue identified and cauterized. Transverse retinaculum was incised and reflected medially and a medial parapatellar arthrotomy was accomplished. the patella was everted and theprepatellar fat pad resected. The superficial medial collateral  ligament was then elevated from anterior to posterior along the proximal  flare of the tibia and anterior half of the menisci resected. The knee was hyperflexed exposing bone on bone arthritis. Peripheral and notch osteophytes as well as the cruciate ligaments were then resected. We continued to  work our way around  posteriorly along the proximal tibia, and externally  rotated the tibia subluxing it out from underneath the femur. A McHale  retractor was placed through the notch and a lateral Hohmann retractor  placed, and we then drilled through the proximal tibia in line with the  axis of the tibia followed by an intramedullary guide rod and 2-degree  posterior slope cutting guide. The tibial cutting guide, 3 degree posterior sloped, was pinned into place allowing resection of 4 mm of bone medially and 9 mm of bone laterally. Satisfied with the tibial resection, we then  entered the distal femur 2 mm anterior to the PCL origin with the  intramedullary guide rod and applied the distal femoral cutting guide  set at 9 mm, with 5 degrees of valgus. This was pinned along the  epicondylar axis. At this point, the distal femoral cut was accomplished without difficulty. We then sized for a #6R femoral component and pinned the guide in 3 degrees of external rotation. The chamfer  cutting guide was pinned into place. The anterior, posterior, and chamfer cuts were accomplished without difficulty followed by  the Attune RP box cutting guide and the box cut. We also removed posterior osteophytes from the posterior femoral condyles. At this  time, the knee was brought into full extension. We checked our  extension and flexion gaps and found them symmetric for a 5 mm bearing. Distracting in extension with a lamina spreader, the posterior horns of the menisci were removed. The posterior patella cut was accomplished with the 9.5 mm Attune cutting guide, sized for a 38mm dome, and the fixation pegs drilled.The knee  was then once again hyperflexed exposing the proximal tibia. We sized for a # 6 tibial base plate, applied the smokestack and the conical reamer followed by the the Delta fin keel punch. We then hammered into place the Attune RP trial femoral component, drilled the lugs, inserted a  5 mm trial bearing, trial patellar button, and took the knee through range of motion from 0-130 degrees. No thumb pressure was required for patellar Tracking. At this point, the limb was wrapped with an Esmarch bandage and the tourniquet inflated to 350 mmHg. All trial components were removed, mating surfaces irrigated with pulse lavage, and dried with suction and sponges. After waiting 1 minute, the bony surfaces were again, dried with sponges. A double batch of DePuy HV cement with 1500 mg of Zinacef was mixed and applied to all bony metallic mating surfaces except for the posterior condyles of the femur itself. In order, we hammered into place the tibial tray and removed excess cement, the femoral component and removed excess cement. The final Attune RP bearing  was inserted, and the knee brought to full extension with compression.  The patellar button was clamped into place, and excess cement  removed. While the cement cured the wound was irrigated out with normal saline solution pulse lavage.  Ligament stability and patellar tracking were checked and found to be excellent. The parapatellar arthrotomy was closed with  running #1 Vicryl suture. The subcutaneous tissue with 0 and 2-0 undyed  Vicryl suture, and the skin with running 3-0 SQ vicryl. A dressing of Xeroform,  4 x 4, dressing sponges, Webril, and Ace wrap applied.   We then proceeded to the L knee, which was flexed120 degrees, by making the anterior midline incision starting at handbreadth above the patella going over the patella 1 cm medial to and 4 cm distal to the tibial tubercle. Small bleeders in  the skin and the  subcutaneous tissue identified and cauterized. Transverse retinaculum was incised and reflected medially and a medial parapatellar arthrotomy was accomplished. the patella was everted and theprepatellar fat pad resected. The superficial medial collateral  ligament was then elevated from anterior to posterior along the proximal  flare of the tibia and anterior half of the menisci resected. The knee was hyperflexed exposing bone on bone arthritis. Peripheral and notch osteophytes as well as the cruciate ligaments were then resected. We continued to  work our way around posteriorly along the proximal tibia, and externally  rotated the tibia subluxing it out from underneath the femur. A McHale  retractor was placed through the notch and a lateral Hohmann retractor  placed, and we then drilled through the proximal tibia in line with the  axis of the tibia followed by an intramedullary guide rod and 2-degree  posterior slope cutting guide. The tibial cutting guide, 3 degree posterior sloped, was pinned into place allowing resection of 4 mm of bone medially and 9 mm of bone laterally. Satisfied with the tibial resection, we then  entered the distal femur 2 mm anterior to the PCL origin with the  intramedullary guide rod and applied the distal femoral cutting guide  set at 9 mm, with 5 degrees of valgus. This was pinned  along the  epicondylar axis. At this point, the distal femoral cut was accomplished without difficulty. We then sized for a #6R femoral component and pinned the guide in 3 degrees of external rotation. The chamfer cutting guide was pinned into place. The anterior, posterior, and chamfer cuts were accomplished without difficulty followed by  the Attune RP box cutting guide and the box cut. We also removed posterior osteophytes from the posterior femoral condyles. At this  time, the knee was brought into full extension. We checked our  extension and flexion gaps and found them symmetric for a 5 mm bearing. Distracting in extension with a lamina spreader, the posterior horns of the menisci were removed. The posterior patella cut was accomplished with the 9.5 mm Attune cutting guide, sized for a 38mm dome, and the fixation pegs drilled.The knee  was then once again hyperflexed exposing the proximal tibia. We sized for a # 6 tibial base plate, applied the smokestack and the conical reamer followed by the the Delta fin keel punch. We then hammered into place the Attune RP trial femoral component, drilled the lugs, inserted a  5 mm trial bearing, trial patellar button, and took the knee through range of motion from 0-130 degrees. No thumb pressure was required for patellar Tracking. At this point, the limb was wrapped with an Esmarch bandage and the tourniquet inflated to 350 mmHg. All trial components were removed, mating surfaces irrigated with pulse lavage, and dried with suction and sponges. After waiting 1 minute, the bony surfaces were again, dried with sponges. A double batch of DePuy HV cement with 1500 mg of Zinacef was mixed and applied to all bony metallic mating surfaces except for the posterior condyles of the femur itself. In order, we hammered into place the tibial tray and removed excess cement, the femoral component and removed excess cement. The final Attune RP bearing  was inserted, and the knee  brought to full extension with compression.  The patellar button was clamped into place, and excess cement  removed. While the cement cured the wound was irrigated out with normal saline solution pulse lavage. Ligament stability and patellar tracking were checked and found to  be excellent. The parapatellar arthrotomy was closed with  running #1 Vicryl suture. The subcutaneous tissue with 0 and 2-0 undyed  Vicryl suture, and the skin with running 3-0 SQ vicryl. A dressing of Xeroform,  4 x 4, dressing sponges, Webril, and Ace wrap applied.   The patient  awakened, and taken to recovery room without difficulty.   Gean Birchwood J 11/22/2016, 3:01 PM

## 2016-11-23 ENCOUNTER — Encounter (HOSPITAL_COMMUNITY): Payer: Self-pay | Admitting: Orthopedic Surgery

## 2016-11-23 DIAGNOSIS — M170 Bilateral primary osteoarthritis of knee: Principal | ICD-10-CM

## 2016-11-23 LAB — CBC
HEMATOCRIT: 31.6 % — AB (ref 36.0–46.0)
Hemoglobin: 10.2 g/dL — ABNORMAL LOW (ref 12.0–15.0)
MCH: 29.6 pg (ref 26.0–34.0)
MCHC: 32.3 g/dL (ref 30.0–36.0)
MCV: 91.6 fL (ref 78.0–100.0)
Platelets: 293 10*3/uL (ref 150–400)
RBC: 3.45 MIL/uL — AB (ref 3.87–5.11)
RDW: 13 % (ref 11.5–15.5)
WBC: 13.6 10*3/uL — AB (ref 4.0–10.5)

## 2016-11-23 LAB — BASIC METABOLIC PANEL
ANION GAP: 8 (ref 5–15)
BUN: 11 mg/dL (ref 6–20)
CO2: 22 mmol/L (ref 22–32)
Calcium: 8.2 mg/dL — ABNORMAL LOW (ref 8.9–10.3)
Chloride: 101 mmol/L (ref 101–111)
Creatinine, Ser: 0.61 mg/dL (ref 0.44–1.00)
GFR calc Af Amer: >60 mL/min (ref >60)
GFR calc non Af Amer: >60 mL/min (ref >60)
GLUCOSE: 155 mg/dL — AB (ref 65–99)
POTASSIUM: 3.9 mmol/L (ref 3.5–5.1)
Sodium: 131 mmol/L — ABNORMAL LOW (ref 135–145)

## 2016-11-23 MED ORDER — APIXABAN 2.5 MG PO TABS
2.5000 mg | ORAL_TABLET | Freq: Two times a day (BID) | ORAL | Status: DC
  Administered 2016-11-23 – 2016-11-25 (×4): 2.5 mg via ORAL
  Filled 2016-11-23 (×4): qty 1

## 2016-11-23 MED ORDER — PNEUMOCOCCAL VAC POLYVALENT 25 MCG/0.5ML IJ INJ
0.5000 mL | INJECTION | INTRAMUSCULAR | Status: AC
  Administered 2016-11-25: 0.5 mL via INTRAMUSCULAR
  Filled 2016-11-23: qty 0.5

## 2016-11-23 MED ORDER — APIXABAN 2.5 MG PO TABS: 2.5000 mg | ORAL_TABLET | Freq: Two times a day (BID) | ORAL | Status: DC

## 2016-11-23 MED ORDER — KETOROLAC TROMETHAMINE 30 MG/ML IJ SOLN
30.0000 mg | Freq: Four times a day (QID) | INTRAMUSCULAR | Status: DC
  Administered 2016-11-23 – 2016-11-24 (×3): 30 mg via INTRAVENOUS
  Filled 2016-11-23 (×3): qty 1

## 2016-11-23 NOTE — Progress Notes (Signed)
I met with pt at bedside but she is very sleepy sitting up in the chair. With her permission, I contacted her spouse by phone to discuss a possible inpt rehab admission pending insurance approval which I will initiate today. Pt prefers an inpt rehab admit rather that SNF at this time. I will follow up tomorrow. [phone]

## 2016-11-23 NOTE — Consult Note (Signed)
Physical Medicine and Rehabilitation Consult Reason for Consult: Decreased functional mobility Referring Physician: Dr. Turner Daniels   HPI: Brandy Cervantes is a 71 y.o. right handed female with history of migraine headaches, hypertension. Per chart review patient lives with spouse in [place]. Independent prior to admission. Husband can assist. Two-level home with bedroom upstairs. Presented 11/21/2016 with bilateral knee pain related to end-stage osteoarthritis with decrease in functional mobility and failed conservative treatments. Underwent bilateral total knee arthroplasty 11/22/2016 per Dr. Turner Daniels. Hospital course pain management. Weightbearing as tolerated bilateral lower extremities. Maintained on aspirin for DVT prophylaxis. Acute blood loss anemia 10.2 and monitored. Mild hyponatremia 131. Physical and occupational therapy evaluations pending. M.D. has requested physical medicine rehabilitation consult.   Review of Systems  Constitutional: Negative for chills and fever.  HENT: Negative for hearing loss.   Eyes: Negative for blurred vision and double vision.  Respiratory: Negative for cough and shortness of breath.   Cardiovascular: Positive for leg swelling. Negative for chest pain and palpitations.  Gastrointestinal: Positive for constipation. Negative for nausea and vomiting.       GERD  Genitourinary: Negative for dysuria, flank pain, hematuria and urgency.  Musculoskeletal: Positive for joint pain and myalgias.  Skin: Negative for rash.  Neurological: Positive for headaches. Negative for seizures and weakness.  Psychiatric/Behavioral:       Anxiety  All other systems reviewed and are negative.  Past Medical History:  Diagnosis Date  . Anxiety   . Arthritis   . GERD (gastroesophageal reflux disease)   . Headache    HX MIGRAINES  . Hypertension    Past Surgical History:  Procedure Laterality Date  . MASTOIDECTOMY     AGE 46  RT SIDE   History  reviewed. No pertinent family history. Social History:  reports that she has never smoked. She has never used smokeless tobacco. She reports that she does not drink alcohol or use drugs. Allergies:  Allergies  Allergen Reactions  . Aspirin Other (See Comments)    Stomach pain  . Alendronate Sodium Nausea And Vomiting  . Benadryl [Diphenhydramine Hcl]   . Gabapentin Other (See Comments)    Stomach pain   . Lisinopril Cough  . Topiramate     Other reaction(s): Other (See Comments) depression  . Pravastatin Rash  . Sumatriptan Rash   Medications Prior to Admission  Medication Sig Dispense Refill  . 5-Hydroxytryptophan (5-HTP) 100 MG CAPS Take 200-300 mg by mouth at bedtime.    Marland Kitchen amLODipine (NORVASC) 10 MG tablet Take 10 mg by mouth daily before breakfast.    . aspirin-acetaminophen-caffeine (EXCEDRIN MIGRAINE) 250-250-65 MG tablet Take 2 tablets by mouth every 6 (six) hours as needed for migraine.    . Cholecalciferol (VITAMIN D3) 1000 units CAPS Take 1,000 Units by mouth daily.    . cyclobenzaprine (FLEXERIL) 5 MG tablet Take 5-10 mg by mouth 3 (three) times daily as needed. For muscle spasms in spine    . diclofenac sodium (VOLTAREN) 1 % GEL Apply 1-2 g topically 4 (four) times daily as needed (for knee pain).    Tery Sanfilippo Calcium (STOOL SOFTENER PO) Take 1 capsule by mouth as needed (for constipation).    . fluticasone (FLONASE) 50 MCG/ACT nasal spray Place 1 spray into both nostrils at bedtime.    Marland Kitchen HYDROcodone-acetaminophen (NORCO) 10-325 MG tablet Take 0.5 tablets by mouth 2 (two) times daily.    Marland Kitchen morphine (MS CONTIN) 30 MG 12 hr tablet Take 30 mg by  mouth every 12 (twelve) hours.    . nabumetone (RELAFEN) 500 MG tablet Take 500 mg by mouth 2 (two) times daily as needed (for pain/inflammation.).    Marland Kitchen OVER THE COUNTER MEDICATION Take 1 tablet by mouth at bedtime as needed (for sleep). Calms Forte    . PARoxetine (PAXIL) 10 MG tablet Take 5 mg by mouth 2 (two) times daily.    Marland Kitchen  triamcinolone cream (KENALOG) 0.1 % Apply 1 application topically 2 (two) times daily as needed (for itching).     . zolpidem (AMBIEN) 10 MG tablet Take 5 mg by mouth at bedtime as needed for sleep.    . ranitidine (ZANTAC) 150 MG tablet Take 150 mg by mouth 2 (two) times daily as needed (takes with nabumetone or excedrin for acid reducer.).       Home:    Functional History:   Functional Status:  Mobility:          ADL:    Cognition: Cognition Orientation Level: Oriented X4    Blood pressure (!) 152/96, pulse (!) 116, temperature 98.3 F (36.8 C), temperature source Oral, resp. rate 12, height 5' 1.5" (1.562 m), weight 65.3 kg (144 lb), SpO2 93 %. Physical Exam  Constitutional: She is oriented to person, place, and time. She appears well-developed.  HENT:  Head: Normocephalic.  Eyes: EOM are normal.  Neck: Normal range of motion. Neck supple. No thyromegaly present.  Cardiovascular: Normal rate and regular rhythm.   Respiratory: Effort normal and breath sounds normal. No respiratory distress.  GI: Soft. Bowel sounds are normal. She exhibits no distension.  Musculoskeletal:  bialteral knees wrapped. Flexed to about 65 degrees bilaterally in chair  Neurological: She is alert and oriented to person, place, and time.  UE 5/5. HF/KE 2/5, ADF/PF 5/5. No sensory issues  Skin:  Bilateral knee incisions are dressed appropriately tender    No results found for this or any previous visit (from the past 24 hour(s)). No results found.  Assessment/Plan: Diagnosis: endstage OA bilateral knees s/p bilateral TKA's 1. Does the need for close, 24 hr/day medical supervision in concert with the patient's rehab needs make it unreasonable for this patient to be served in a less intensive setting? Yes 2. Co-Morbidities requiring supervision/potential complications: pain mgt, wound care, hyponatremia, post-operative anemia 3. Due to bladder management, bowel management, safety, skin/wound  care, disease management, medication administration, pain management and patient education, does the patient require 24 hr/day rehab nursing? Yes 4. Does the patient require coordinated care of a physician, rehab nurse, PT (1-2 hrs/day, 5 days/week) and OT (1-2 hrs/day, 5 days/week) to address physical and functional deficits in the context of the above medical diagnosis(es)? Yes Addressing deficits in the following areas: balance, endurance, locomotion, strength, transferring, bowel/bladder control, bathing, dressing, feeding, grooming, toileting and psychosocial support 5. Can the patient actively participate in an intensive therapy program of at least 3 hrs of therapy per day at least 5 days per week? Yes 6. The potential for patient to make measurable gains while on inpatient rehab is excellent 7. Anticipated functional outcomes upon discharge from inpatient rehab are modified independent  with PT, modified independent with OT, n/a with SLP. 8. Estimated rehab length of stay to reach the above functional goals is: 7 days 9. Anticipated D/C setting: Home 10. Anticipated post D/C treatments: HH therapy 11. Overall Rehab/Functional Prognosis: excellent  RECOMMENDATIONS: This patient's condition is appropriate for continued rehabilitative care in the following setting: CIR Patient has agreed to participate in  recommended program. Yes Note that insurance prior authorization may be required for reimbursement for recommended care.  Comment: Rehab Admissions Coordinator to follow up.  Thanks,  Ranelle OysterZachary T. Crystian Frith, MD, [place] DomFAAPMR    Charlton AmorANGIULLI,DANIEL J., PA-C 11/23/2016

## 2016-11-23 NOTE — Progress Notes (Signed)
Room air sat 85%, placed on continuous pulse ox and 1L nasal cannula, sats increased to 91%

## 2016-11-23 NOTE — Evaluation (Signed)
Physical Therapy Evaluation Patient Details Name: Brandy FinlandJanice Cervantes MRN: [id number] DOB: Sep 29, 1945 Today's Date: 11/23/2016   History of Present Illness  Pt is a 71 yo female c/o bilateral knee pain secondary to end stage bilateral knee OA. 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 PMH of OA, HTN and migraine.   Clinical Impression  Pt is s/p bilateral TKA resulting in the deficits listed below (see PT Problem List). Pt is modAx2 for bed mobility, transfers with RW and ambulation of 2 feet with RW.  Pt will benefit from skilled PT to increase their independence and safety with mobility to allow discharge to the venue listed below.      Follow Up Recommendations CIR    Equipment Recommendations   (to be determined at next venue)    Recommendations for Other Services Rehab consult     Precautions / Restrictions Precautions Precautions: Knee Precaution Booklet Issued: No Restrictions Weight Bearing Restrictions: Yes RLE Weight Bearing: Weight bearing as tolerated LLE Weight Bearing: Weight bearing as tolerated      Mobility  Bed Mobility Overal bed mobility: Needs Assistance Bed Mobility: Supine to Sit     Supine to sit: Mod assist;+2 for physical assistance     General bed mobility comments: Mod assist +2 for management of B LE's and for raising trunk from bed.   Transfers Overall transfer level: Needs assistance Equipment used: Rolling walker (2 wheeled) Transfers: Sit to/from Stand Sit to Stand: Mod assist;+2 physical assistance         General transfer comment: Mod +2 lifting and steadying assist.  Ambulation/Gait Ambulation/Gait assistance: Mod assist;+2 physical assistance Ambulation Distance (Feet): 2 Feet Assistive device: Rolling walker (2 wheeled) Gait Pattern/deviations: Step-to pattern;Decreased step length - right;Decreased step length - left;Antalgic;Trunk flexed Gait velocity: slowed Gait velocity interpretation: Below normal speed for age/gender General Gait Details: pt  shuffle stepped to recliner with modAx2 to decrease weightbearing, vc for increased UE support through RW to reduce weightbearing      Balance Overall balance assessment: Needs assistance Sitting-balance support: No upper extremity supported;Feet supported Sitting balance-Leahy Scale: Fair   Postural control: Posterior lean (due to pain) Standing balance support: During functional activity;Bilateral upper extremity supported Standing balance-Leahy Scale: Poor Standing balance comment: Reliant on RW and external assist.                             Pertinent Vitals/Pain Pain Assessment: Faces Faces Pain Scale: Hurts whole lot Pain Location: bilateral knees Pain Descriptors / Indicators: Constant;Burning;Pressure Pain Intervention(s): Monitored during session;RN gave pain meds during session  VSS    Home Living Family/patient expects to be discharged to:: Private residence Living Arrangements: Spouse/significant other Available Help at Discharge: Family;Available 24 hours/day Type of Home: House Home Access: Stairs to enter Entrance Stairs-Rails: Left Entrance Stairs-Number of Steps: 1 (in back); 3 (in front) Home Layout: Multi-level Home Equipment: Tub bench;Walker - 2 wheels;Grab bars - tub/shower;Bedside commode;Hand held shower head      Prior Function Level of Independence: Independent               Hand Dominance   Dominant Hand: Right    Extremity/Trunk Assessment   Upper Extremity Assessment Upper Extremity Assessment: Generalized weakness    Lower Extremity Assessment Lower Extremity Assessment: RLE deficits/detail;LLE deficits/detail RLE Deficits / Details: hip and knee ROM limited by pain RLE: Unable to fully assess due to pain LLE Deficits / Details: hip and knee ROM limited by surgical pain  LLE: Unable to fully assess due to pain    Cervical / Trunk Assessment Cervical / Trunk Assessment: Normal  Communication   Communication: No  difficulties  Cognition Arousal/Alertness: Awake/alert Behavior During Therapy: Anxious Overall Cognitive Status: Within Functional Limits for tasks assessed                                 General Comments: anxious about pain with movement         Exercises Total Joint Exercises Ankle Circles/Pumps: AROM;Both;10 reps;Supine Long Arc Quad: AROM;Both;5 reps Knee Flexion: AROM;Both;5 reps;Seated   Assessment/Plan    PT Assessment Patient needs continued PT services  PT Problem List Decreased strength;Decreased range of motion;Decreased activity tolerance;Decreased balance;Decreased mobility;Decreased knowledge of use of DME;Pain       PT Treatment Interventions DME instruction;Gait training;Stair training;Functional mobility training;Therapeutic activities;Therapeutic exercise;Balance training;Patient/family education    PT Goals (Current goals can be found in the Care Plan section)  Acute Rehab PT Goals Patient Stated Goal: To have less pain PT Goal Formulation: With patient Time For Goal Achievement: 12/07/16 Potential to Achieve Goals: Good    Frequency 7X/week   Barriers to discharge        Co-evaluation PT/OT/SLP Co-Evaluation/Treatment: Yes Reason for Co-Treatment: For patient/therapist safety PT goals addressed during session: Mobility/safety with mobility;Proper use of DME;Strengthening/ROM;Balance         AM-PAC PT "6 Clicks" Daily Activity  Outcome Measure Difficulty turning over in bed (including adjusting bedclothes, sheets and blankets)?: Total Difficulty moving from lying on back to sitting on the side of the bed? : Total Difficulty sitting down on and standing up from a chair with arms (e.g., wheelchair, bedside commode, etc,.)?: Total Help needed moving to and from a bed to chair (including a wheelchair)?: A Lot Help needed walking in hospital room?: Total Help needed climbing 3-5 steps with a railing? : Total 6 Click Score: 7     End of Session Equipment Utilized During Treatment: Gait belt Activity Tolerance: Patient limited by pain Patient left: in chair;with call bell/phone within reach;with family/visitor present Nurse Communication: Mobility status;Weight bearing status PT Visit Diagnosis: Unsteadiness on feet (R26.81);Other abnormalities of gait and mobility (R26.89);Muscle weakness (generalized) (M62.81);Pain Pain - Right/Left: Right (Left) Pain - part of body: Knee    Time: 1610-9604 PT Time Calculation (min) (ACUTE ONLY): 44 min   Charges:   PT Evaluation $PT Eval Low Complexity: 1 Procedure PT Treatments $Therapeutic Activity: 8-22 mins   PT G Codes:        Zephaniah Enyeart B. Beverely Risen PT, DPT Acute Rehabilitation  [phone] Pager [phone]   Elon Alas Fleet 11/23/2016, 11:48 AM

## 2016-11-23 NOTE — Evaluation (Signed)
Occupational Therapy Evaluation Patient Details Name: Brandy Cervantes MRN: [id number] DOB: Nov 24, 1945 Today's Date: 11/23/2016    History of Present Illness Pt is a 71 yo female c/o bilateral knee pain secondary to end stage bilateral knee OA. 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 PMH of OA, HTN and migraine.    Clinical Impression   PTA, pt was independent with ADL and functional mobility. She lives with her husband who is available for 24 hour assistance. Pt currently limited by post-operative pain but willing to participate with therapy. Pt requires mod assist +2 for toilet transfers and LB ADL. She demonstrates significant decline from PLOF and would benefit from continued rehabilitation at CIR level in order to maximize return to PLOF. She would benefit from continued OT services while admitted to improve independence with ADL. Will continue to follow while admitted.    Follow Up Recommendations  CIR    Equipment Recommendations  Other (comment) (TBD at next venue of care)    Recommendations for Other Services Rehab consult     Precautions / Restrictions Precautions Precautions: Knee Precaution Booklet Issued: No Restrictions Weight Bearing Restrictions: Yes RLE Weight Bearing: Weight bearing as tolerated LLE Weight Bearing: Weight bearing as tolerated      Mobility Bed Mobility Overal bed mobility: Needs Assistance Bed Mobility: Supine to Sit     Supine to sit: Mod assist;+2 for physical assistance     General bed mobility comments: Mod assist +2 for management of B LE's and for raising trunk from bed.   Transfers Overall transfer level: Needs assistance Equipment used: Rolling walker (2 wheeled) Transfers: Sit to/from Stand Sit to Stand: Mod assist;+2 physical assistance         General transfer comment: Mod +2 lifting and steadying assist.    Balance Overall balance assessment: Needs assistance Sitting-balance support: No upper extremity supported;Feet supported Sitting  balance-Leahy Scale: Fair   Postural control: Posterior lean (due to pain) Standing balance support: During functional activity;Bilateral upper extremity supported Standing balance-Leahy Scale: Poor Standing balance comment: Reliant on RW and external assist.                           ADL either performed or assessed with clinical judgement   ADL Overall ADL's : Needs assistance/impaired Eating/Feeding: Set up;Sitting   Grooming: Minimal assistance;Sitting   Upper Body Bathing: Minimal assistance;Sitting   Lower Body Bathing: Sit to/from stand;Moderate assistance   Upper Body Dressing : Minimal assistance;Sitting   Lower Body Dressing: Moderate assistance;Sit to/from stand   Toilet Transfer: Moderate assistance;+2 for physical assistance;Ambulation;RW Toilet Transfer Details (indicate cue type and reason): Ambulating 2 feet Toileting- Clothing Manipulation and Hygiene: Maximal assistance;+2 for physical assistance;Sit to/from stand       Functional mobility during ADLs: Moderate assistance;+2 for physical assistance;Rolling walker General ADL Comments: Pain limiting ability to participate with ADL. Able to doff socks without physical assistance but unable to pull up pants in standing position.     Vision Patient Visual Report: No change from baseline Vision Assessment?: No apparent visual deficits     Perception     Praxis      Pertinent Vitals/Pain Pain Assessment: Faces Faces Pain Scale: Hurts whole lot Pain Location: bilateral knees Pain Descriptors / Indicators: Constant;Burning;Pressure Pain Intervention(s): Monitored during session;RN gave pain meds during session     Hand Dominance Right   Extremity/Trunk Assessment Upper Extremity Assessment Upper Extremity Assessment: Generalized weakness   Lower Extremity Assessment Lower Extremity Assessment: RLE  deficits/detail;LLE deficits/detail RLE Deficits / Details: hip and knee ROM limited by  pain RLE: Unable to fully assess due to pain LLE Deficits / Details: hip and knee ROM limited by surgical pain LLE: Unable to fully assess due to pain   Cervical / Trunk Assessment Cervical / Trunk Assessment: Normal   Communication Communication Communication: No difficulties   Cognition Arousal/Alertness: Awake/alert Behavior During Therapy: Anxious Overall Cognitive Status: Within Functional Limits for tasks assessed                                 General Comments: anxious about pain with movement   General Comments       Exercises     Shoulder Instructions      Home Living Family/patient expects to be discharged to:: Private residence Living Arrangements: Spouse/significant other Available Help at Discharge: Family;Available 24 hours/day Type of Home: House Home Access: Stairs to enter Entergy CorporationEntrance Stairs-Number of Steps: 1 (in back); 3 (in front) Entrance Stairs-Rails: Left Home Layout: Multi-level Alternate Level Stairs-Number of Steps: 5 steps to kitchen; planning to stay on upstairs level   Bathroom Shower/Tub: Chief Strategy OfficerTub/shower unit   Bathroom Toilet: Handicapped height Bathroom Accessibility: Yes How Accessible: Accessible via walker Home Equipment: Tub bench;Walker - 2 wheels;Grab bars - tub/shower;Bedside commode;Hand held shower head          Prior Functioning/Environment Level of Independence: Independent                 OT Problem List: Decreased strength;Decreased range of motion;Decreased activity tolerance;Impaired balance (sitting and/or standing);Decreased safety awareness;Decreased knowledge of use of DME or AE;Decreased knowledge of precautions;Pain      OT Treatment/Interventions: Self-care/ADL training;Therapeutic exercise;Energy conservation;DME and/or AE instruction;Therapeutic activities;Cognitive remediation/compensation;Patient/family education;Balance training    OT Goals(Current goals can be found in the care plan  section) Acute Rehab OT Goals Patient Stated Goal: To have less pain OT Goal Formulation: With patient/family Time For Goal Achievement: 12/07/16 Potential to Achieve Goals: Good ADL Goals Pt Will Perform Grooming: with min assist;standing Pt Will Perform Lower Body Bathing: with min assist;sit to/from stand Pt Will Perform Lower Body Dressing: with min assist;sit to/from stand Pt Will Transfer to Toilet: with min assist;ambulating;bedside commode (BSC over toilet) Pt Will Perform Toileting - Clothing Manipulation and hygiene: with min assist;sit to/from stand Additional ADL Goal #1: Pt will complete bed mobility with min assist in preparation for ADL at EOB.  OT Frequency: Min 3X/week   Barriers to D/C:            Co-evaluation              AM-PAC PT "6 Clicks" Daily Activity     Outcome Measure Help from another person eating meals?: None Help from another person taking care of personal grooming?: A Little Help from another person toileting, which includes using toliet, bedpan, or urinal?: A Lot Help from another person bathing (including washing, rinsing, drying)?: A Lot Help from another person to put on and taking off regular upper body clothing?: A Little Help from another person to put on and taking off regular lower body clothing?: A Lot 6 Click Score: 16   End of Session Equipment Utilized During Treatment: Gait belt;Rolling walker Nurse Communication: Mobility status  Activity Tolerance: Patient limited by pain Patient left: in chair;with call bell/phone within reach;with family/visitor present  OT Visit Diagnosis: Other abnormalities of gait and mobility (R26.89);Pain Pain - Right/Left:  (Bilateral)  Pain - part of body: Knee                Time: 4098-1191 OT Time Calculation (min): 44 min Charges:  OT General Charges $OT Visit: 1 Procedure OT Evaluation $OT Eval Moderate Complexity: 1 Procedure G-Codes:     Doristine Section, MS OTR/L  Pager:  [phone]   Noreene Boreman A Elber Galyean 11/23/2016, 11:32 AM

## 2016-11-23 NOTE — Care Management (Signed)
Patient will need CIR or SNF placement for shortterm rehab. Case manager did speak with her briefly concerning recovery process. No further CM needs at this time.

## 2016-11-23 NOTE — Anesthesia Post-op Follow-up Note (Signed)
  Anesthesia Pain Follow-up Note  Patient: Merilynn FinlandJanice Afzal  Day #: 1  Date of Follow-up: 11/23/2016 Time: 9:30 AM  Last Vitals:  Vitals:   11/23/16 0809 11/23/16 0825  BP:  (!) 150/80  Pulse:    Resp: 12   Temp:      Level of Consciousness: alert  Pain: moderate   Side Effects:None  Catheter Site Exam:clean, dry, no drainage  Anti-Coag Meds    Start     Dose/Rate Route Frequency Ordered Stop   11/23/16 1000  apixaban (ELIQUIS) tablet 2.5 mg     2.5 mg Oral 2 times daily 11/23/16 0749         Plan: Catheter removed/tip intact at surgeon's request  Keiandra Sullenger,JAMES TERRILL

## 2016-11-23 NOTE — Progress Notes (Addendum)
PATIENT ID: Brandy Cervantes  MRN: [id number]  DOB/AGE:  1945/09/11 / 71 y.o.  1 Day Post-Op Procedure(s) (LRB): TOTAL KNEE BILATERAL (Bilateral)    PROGRESS NOTE Subjective: Patient is alert, oriented, No Nausea, No Vomiting, yes passing gas. Taking PO Well. Denies SOB, Chest or Calf Pain. Using Incentive Spirometer, PAS in place. Ambulate Weightbearing as tolerated with physical therapy today, Patient reports pain as 7/10.Epidural catheter is in place.    Objective: Vital signs in last 24 hours: Vitals:   11/22/16 1819 11/22/16 2022 11/23/16 0020 11/23/16 0500  BP:  132/72 135/84 (!) 152/96  Pulse: 90 (!) 105 (!) 112 (!) 116  Resp: (!) 21 12 12 12   Temp: (!) 96.8 F (36 C) 97.6 F (36.4 C) 97.6 F (36.4 C) 98.3 F (36.8 C)  TempSrc:  Oral Oral Oral  SpO2: 97% 100% 100% 93%  Weight:      Height:          Intake/Output from previous day: I/O last 3 completed shifts: In: 1450 [P.O.:200; I.V.:1250] Out: 1075 [Urine:775; Blood:300]   Intake/Output this shift: No intake/output data recorded.   LABORATORY DATA:  Recent Labs  11/23/16 0530  WBC 13.6*  HGB 10.2*  HCT 31.6*  PLT 293  NA 131*  K 3.9  CL 101  CO2 22  BUN 11  CREATININE 0.61  GLUCOSE 155*  CALCIUM 8.2*    Examination: Neurologically intact ABD soft Neurovascular intact Sensation intact distally Intact pulses distally Dorsiflexion/Plantar flexion intact Incision: dressing C/D/I No cellulitis present Compartment soft} Patient is able to move her feet up and down, passive range of motion 5/45 demonstrated in hospital bed today.  Minimal discomfort with axial loading of the foot.  Assessment:   1 Day Post-Op Procedure(s) (LRB): TOTAL KNEE BILATERAL (Bilateral) ADDITIONAL DIAGNOSIS: Expected Acute Blood Loss Anemia, Chronic low back pain, chronic pain management  Plan: PT/OT WBAT, AROM and PROM  DVT Prophylaxis:  SCDx72hrs, Eliquis 2.5 mg by mouth twice a day x 2 weeks DISCHARGE PLAN: Home versus  short-term stay in skilled nursing facility based on how patient does with physical therapy today  DISCHARGE NEEDS: HHPT, Walker, elevated toilet seat.     Nestor LewandowskyROWAN,Mateo Overbeck J 11/23/2016, 7:31 AM Patient ID: Brandy Cervantes, female   DOB: 1945/09/11, 71 y.o.   MRN: [id number]

## 2016-11-23 NOTE — Progress Notes (Signed)
Spoke with Dr. Maple HudsonMoser, anesthesiologist.  Epidural infusion stopped per verbal order of Dr. Maple HudsonMoser.  Anesthesia to remove catheter around 9am per Dr. Maple HudsonMoser.  Patient made aware.  Will provide pain medication prior to removal.  Will continue to monitor.

## 2016-11-23 NOTE — Progress Notes (Signed)
Patient declined ice packs at this time, she states that the pressure is causing too much pain.  Educated the patient on ice pack use.  Will re-attempt.

## 2016-11-23 NOTE — Progress Notes (Signed)
Physical Therapy Treatment Patient Details Name: Brandy Cervantes MRN: [id number] DOB: June 22, 1946 Today's Date: 11/23/2016    History of Present Illness Pt is a 71 yo female c/o bilateral knee pain secondary to end stage bilateral knee OA. 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 PMH of OA, HTN and migraine.     PT Comments    Pt is slowly progressing towards her goals. Pt was very fatigued during session. Pt is currently maxAx2 for bed mobility, modAx2 for transfer to RW and modAx2 regressing to maxAx2 for ambulation of 4 feet to get back in bed. Once in bed patient participated in knee ROM exercises. Pt requires skilled PT to progress transfers and ambulation and to improve bilateral knee ROM to be able to safely mobilize in her discharge environment.      Follow Up Recommendations  CIR     Equipment Recommendations   (to be determined at next venue)    Recommendations for Other Services Rehab consult     Precautions / Restrictions Precautions Precautions: Knee Precaution Booklet Issued: No Restrictions Weight Bearing Restrictions: Yes RLE Weight Bearing: Weight bearing as tolerated LLE Weight Bearing: Weight bearing as tolerated    Mobility  Bed Mobility Overal bed mobility: Needs Assistance Bed Mobility: Sit to Supine       Sit to supine: Max assist;+2 for physical assistance   General bed mobility comments: pt required maxAx2 for managemetn of LEs into bed and scoot up in bed  Transfers Overall transfer level: Needs assistance Equipment used: Rolling walker (2 wheeled) Transfers: Sit to/from Stand Sit to Stand: +2 physical assistance;Mod assist         General transfer comment: modAx2 for power up and steadying in upright requires max vc for coming all the way to upright and anterior pelvic tilt   Ambulation/Gait Ambulation/Gait assistance: Mod assist;+2 physical assistance;Max assist Ambulation Distance (Feet): 4 Feet Assistive device: Rolling walker (2 wheeled) Gait  Pattern/deviations: Step-to pattern;Decreased step length - right;Decreased step length - left;Antalgic;Trunk flexed Gait velocity: slowed Gait velocity interpretation: Below normal speed for age/gender General Gait Details: pt started with modAx2 for reducing weightbearing through LE with gait but increased to maxAx2 as pt fatigued and pain increased          Balance Overall balance assessment: Needs assistance Sitting-balance support: No upper extremity supported;Feet supported Sitting balance-Leahy Scale: Fair Sitting balance - Comments: able to sit edge of recliner  Postural control: Posterior lean (due to pain) Standing balance support: During functional activity;Bilateral upper extremity supported Standing balance-Leahy Scale: Poor Standing balance comment: Reliant on RW and external assist.                            Cognition Arousal/Alertness: Awake/alert Behavior During Therapy: Anxious Overall Cognitive Status: Within Functional Limits for tasks assessed                                 General Comments: anxious about pain with movement      Exercises Total Joint Exercises Ankle Circles/Pumps: AROM;Both;10 reps;Supine Long Arc Quad: AROM;Both;5 reps Knee Flexion: AROM;Both;5 reps;Seated Goniometric ROM: R knee ROM 11 to 65 degrees, L knee ROM 4 to 75 degrees    General Comments        Pertinent Vitals/Pain Pain Assessment: Faces Faces Pain Scale: Hurts whole lot Pain Location: bilateral knees Pain Descriptors / Indicators: Constant;Burning;Pressure Pain Intervention(s): Limited activity within patient's  tolerance;Monitored during session;Premedicated before session  VSS           PT Goals (current goals can now be found in the care plan section) Acute Rehab PT Goals Patient Stated Goal: To have less pain PT Goal Formulation: With patient Time For Goal Achievement: 12/07/16 Potential to Achieve Goals: Good Progress towards PT  goals: Progressing toward goals    Frequency    7X/week      PT Plan Current plan remains appropriate       AM-PAC PT "6 Clicks" Daily Activity  Outcome Measure  Difficulty turning over in bed (including adjusting bedclothes, sheets and blankets)?: Total Difficulty moving from lying on back to sitting on the side of the bed? : Total Difficulty sitting down on and standing up from a chair with arms (e.g., wheelchair, bedside commode, etc,.)?: Total Help needed moving to and from a bed to chair (including a wheelchair)?: A Lot Help needed walking in hospital room?: Total Help needed climbing 3-5 steps with a railing? : Total 6 Click Score: 7    End of Session Equipment Utilized During Treatment: Gait belt Activity Tolerance: Patient limited by pain Patient left: in chair;with call bell/phone within reach;with family/visitor present Nurse Communication: Mobility status;Weight bearing status PT Visit Diagnosis: Unsteadiness on feet (R26.81);Other abnormalities of gait and mobility (R26.89);Muscle weakness (generalized) (M62.81);Pain Pain - Right/Left: Right (Left) Pain - part of body: Knee     Time: 1520-1550 PT Time Calculation (min) (ACUTE ONLY): 30 min  Charges:  $Therapeutic Exercise: 8-22 mins $Therapeutic Activity: 8-22 mins                    G Codes:       Brandy Cervantes PT, DPT Acute Rehabilitation  [phone] Pager [phone]     Brandy Cervantes 11/23/2016, 4:53 PM

## 2016-11-24 LAB — CBC
HCT: 26.7 % — ABNORMAL LOW (ref 36.0–46.0)
Hemoglobin: 8.6 g/dL — ABNORMAL LOW (ref 12.0–15.0)
MCH: 29.8 pg (ref 26.0–34.0)
MCHC: 32.2 g/dL (ref 30.0–36.0)
MCV: 92.4 fL (ref 78.0–100.0)
PLATELETS: 227 10*3/uL (ref 150–400)
RBC: 2.89 MIL/uL — AB (ref 3.87–5.11)
RDW: 13.1 % (ref 11.5–15.5)
WBC: 10.8 10*3/uL — AB (ref 4.0–10.5)

## 2016-11-24 MED ORDER — APIXABAN 2.5 MG PO TABS: 2.5000 mg | ORAL_TABLET | Freq: Two times a day (BID) | ORAL | 0 refills | Status: DC

## 2016-11-24 MED ORDER — KETOROLAC TROMETHAMINE 30 MG/ML IJ SOLN
15.0000 mg | Freq: Four times a day (QID) | INTRAMUSCULAR | Status: DC
  Administered 2016-11-24 – 2016-11-25 (×5): 15 mg via INTRAVENOUS
  Filled 2016-11-24 (×5): qty 1

## 2016-11-24 NOTE — Progress Notes (Signed)
Patient able to tolerat bone foam from 2300-0015

## 2016-11-24 NOTE — Progress Notes (Signed)
Rehab admissions - I spoke with patient and her husband in the room by phone.  I explained the copays of $430 per day for days 1-4.  Patient and husband do not want to have those copays and are choosing to go to [facility] which will be $0 copay for the first 20 days.  We [facility], but patient/spouse wish to pursue SNF.  I have updated unit case manager, Vance PeperSusan Brady.  Call me for questions.  [phone]

## 2016-11-24 NOTE — Progress Notes (Signed)
PATIENT ID: Brandy Cervantes  MRN: [id number]  DOB/AGE:  71-Apr-1947 / 71 y.o.  2 Days Post-Op Procedure(s) (LRB): TOTAL KNEE BILATERAL (Bilateral)    PROGRESS NOTE Subjective: Patient is alert, oriented, no Nausea, no Vomiting, yes passing gas. Taking PO well. Denies SOB, Chest or Calf Pain. Using Incentive Spirometer, PAS in place. Ambulate WBAT with pt walking 4 ft with therapy yesterday , Patient reports pain as 2/10 at rest .    Objective: Vital signs in last 24 hours: Vitals:   11/23/16 0825 11/23/16 1431 11/23/16 2023 11/24/16 0535  BP: (!) 150/80 122/73 130/62 (!) 92/49  Pulse:  (!) 116 98 99  Resp:  15 16 16   Temp:  (!) 94.5 F (34.7 C) 98.6 F (37 C) 98.7 F (37.1 C)  TempSrc:  Axillary Oral Oral  SpO2:  93% 96% 98%  Weight:      Height:          Intake/Output from previous day: I/O last 3 completed shifts: In: 120 [P.O.:120] Out: 3125 [Urine:3125]   Intake/Output this shift: No intake/output data recorded.   LABORATORY DATA:  Recent Labs  11/23/16 0530 11/24/16 0545  WBC 13.6* 10.8*  HGB 10.2* 8.6*  HCT 31.6* 26.7*  PLT 293 227  NA 131*  --   K 3.9  --   CL 101  --   CO2 22  --   BUN 11  --   CREATININE 0.61  --   GLUCOSE 155*  --   CALCIUM 8.2*  --     Examination: Neurologically intact ABD soft Neurovascular intact Sensation intact distally Intact pulses distally Dorsiflexion/Plantar flexion intact Incision: dressing C/D/I and no drainage No cellulitis present Compartment soft}  Assessment:   2 Days Post-Op Procedure(s) (LRB): TOTAL KNEE BILATERAL (Bilateral) ADDITIONAL DIAGNOSIS: Expected Acute Blood Loss Anemia, Chronic low back pain, chronic pain management  Plan: PT/OT WBAT, AROM and PROM  DVT Prophylaxis:  SCDx72hrs, eliquis 2.5 mg BID x 2 weeks DISCHARGE PLAN: Inpatient Rehab if covered by insurance DISCHARGE NEEDS: HHPT, Walker and 3-in-1 comode seat     Brandy Cervantes R 11/24/2016, 8:28 AM

## 2016-11-24 NOTE — Care Management (Signed)
Case manger spoke with Brandy Cervantes, Rehab Coordinator, patient's husband informed her that they can not afford co-pay for CIR and patient will need to go to SNF. Preferrable [facility]. CM has given this information to Child psychotherapistocial Worker to begin placement process.

## 2016-11-24 NOTE — Progress Notes (Signed)
Physical Therapy Treatment Patient Details Name: Brandy Cervantes MRN: [id number] DOB: [birth year] Today's Date: 11/24/2016    History of Present Illness Pt is a 71 yo female c/o bilateral knee pain secondary to end stage bilateral knee OA. 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 PMH of OA, HTN and migraine.     PT Comments    Pt is making good progress towards her goals. Pt is min guard for bed mobility, modAx2 for transfers to RW and minAx1 for ambulation of 50 feet with RW. Pt requires skilled PT to progress transfer and gait training and to improve LE strength, ROM and endurance to be able to safely navigate her discharge environment.    Follow Up Recommendations  SNF     Equipment Recommendations   (to be determined at next venue)       Precautions / Restrictions Precautions Precautions: Knee Precaution Booklet Issued: No Restrictions Weight Bearing Restrictions: Yes RLE Weight Bearing: Weight bearing as tolerated LLE Weight Bearing: Weight bearing as tolerated    Mobility  Bed Mobility Overal bed mobility: Needs Assistance Bed Mobility: Sit to Supine     Supine to sit: Min guard Sit to supine: Min guard   General bed mobility comments: pt able to manage LE to and from floor without assist vc for hand placement and scooting hips  Transfers Overall transfer level: Needs assistance Equipment used: Rolling walker (2 wheeled) Transfers: Sit to/from Stand Sit to Stand: Mod assist;+2 physical assistance         General transfer comment: modAx2 for power up and steadying in upright, vc for hand placement, anterior pelvic tilt to come all the way to upright  Ambulation/Gait Ambulation/Gait assistance: Min assist Ambulation Distance (Feet): 50 Feet Assistive device: Rolling walker (2 wheeled) Gait Pattern/deviations: Step-to pattern;Decreased step length - right;Decreased step length - left;Antalgic;Trunk flexed;Step-through pattern Gait velocity: slowed Gait velocity interpretation: Below  normal speed for age/gender General Gait Details: pt with difficulty in RW management requiring maximal cuing to stay inside walker and for sequencing of feet and walker advancement, vc for upright posture and anterior pelvic tilt as well as UE support to decrease weightbearing through bilateral LE         Balance Overall balance assessment: Needs assistance Sitting-balance support: No upper extremity supported;Feet supported Sitting balance-Leahy Scale: Fair Sitting balance - Comments: able to sit edge of recliner  Postural control: Posterior lean (due to pain) Standing balance support: During functional activity;Bilateral upper extremity supported Standing balance-Leahy Scale: Fair Standing balance comment: Reliant on RW and external assist.                            Cognition Arousal/Alertness: Awake/alert Behavior During Therapy: Anxious;Impulsive Overall Cognitive Status: Within Functional Limits for tasks assessed                                 General Comments: pt impulsive trying to get out of recliner to go to bathroom without lowering leg rest of recliner and without nursing assist      Exercises Total Joint Exercises Ankle Circles/Pumps: AROM;Both;10 reps;Supine Quad Sets: AROM;10 reps;Both;Seated Heel Slides: AROM;10 reps;Seated Long Arc Quad: AROM;Both;5 reps Knee Flexion: AROM;Both;5 reps;Seated Goniometric ROM: R knee ROM 8 to 98 degrees in recliner, L knee ROM 10 to 78 degrees in recliner        Pertinent Vitals/Pain Pain Assessment: 0-10 Pain Score: 4  Pain  Location: bilateral knees Pain Descriptors / Indicators: Burning;Pressure Pain Intervention(s): Monitored during session;Repositioned  VSS           PT Goals (current goals can now be found in the care plan section) Acute Rehab PT Goals Patient Stated Goal: To have less pain PT Goal Formulation: With patient Time For Goal Achievement: 12/07/16 Potential to Achieve  Goals: Good Progress towards PT goals: Progressing toward goals    Frequency    7X/week      PT Plan Current plan remains appropriate       AM-PAC PT "6 Clicks" Daily Activity  Outcome Measure  Difficulty turning over in bed (including adjusting bedclothes, sheets and blankets)?: A Lot Difficulty moving from lying on back to sitting on the side of the bed? : A Lot Difficulty sitting down on and standing up from a chair with arms (e.g., wheelchair, bedside commode, etc,.)?: Total Help needed moving to and from a bed to chair (including a wheelchair)?: A Little Help needed walking in hospital room?: A Little Help needed climbing 3-5 steps with a railing? : Total 6 Click Score: 12    End of Session Equipment Utilized During Treatment: Gait belt Activity Tolerance: Patient tolerated treatment well Patient left: in bed;with bed alarm set;with call bell/phone within reach Nurse Communication: Mobility status;Weight bearing status PT Visit Diagnosis: Unsteadiness on feet (R26.81);Other abnormalities of gait and mobility (R26.89);Muscle weakness (generalized) (M62.81);Pain Pain - Right/Left: Right (Left) Pain - part of body: Knee     Time: 1448-1510 PT Time Calculation (min) (ACUTE ONLY): 22 min  Charges:  $Gait Training: 8-22 mins $Therapeutic Exercise: 8-22 mins                    G Codes:       Brandy Cervantes PT, DPT Acute Rehabilitation  [phone] Pager [phone]     Brandy Cervantes 11/24/2016, 4:29 PM

## 2016-11-24 NOTE — [place] FL2 (Signed)
[place] MEDICAID FL2 LEVEL OF CARE SCREENING TOOL     IDENTIFICATION  Patient Name: Brandy FinlandJanice Cervantes Birthdate: 26-Apr-1946 Sex: female Admission Date (Current Location): 11/22/2016  [facility] [place]  [employer]   Facility and Address:  [facility] [place], [address], [place], [place] [address]      Provider Number: [id number]  Attending Physician Name and Address:  Gean Birchwoodowan, Frank, MD  Relative Name and Phone Number:       Current Level of Care: SNF Recommended Level of Care: Skilled Nursing Facility Prior Approval Number:    Date Approved/Denied:   PASRR Number: [id number] [id number]  Discharge Plan: SNF    Current Diagnoses: Patient Active Problem List   Diagnosis Date Noted  . Bilateral primary osteoarthritis of knee 11/17/2016    Orientation RESPIRATION BLADDER Height & Weight     Self, Time, Situation, Place  Normal Continent Weight: 144 lb (65.3 kg) Height:  5' 1.5" (156.2 cm)  BEHAVIORAL SYMPTOMS/MOOD NEUROLOGICAL BOWEL NUTRITION STATUS      Continent Diet (Regular Diet/Thin Liquid)  AMBULATORY STATUS COMMUNICATION OF NEEDS Skin   Extensive Assist Verbally Surgical wounds (Left and Right Knee)                       Personal Care Assistance Level of Assistance  Bathing, Feeding, Dressing Bathing Assistance: Limited assistance Feeding assistance: Independent Dressing Assistance: Limited assistance     Functional Limitations Info  Sight, Hearing, Speech Sight Info: Adequate Hearing Info: Adequate Speech Info: Adequate    SPECIAL CARE FACTORS FREQUENCY  PT (By licensed PT), OT (By licensed OT)     PT Frequency: 5x week OT Frequency: 5x week            Contractures Contractures Info: Not present    Additional Factors Info  Code Status, Allergies, Psychotropic Code Status Info: Full Code Allergies Info: Aspirin, Alendronate Sodium, Benadryl Diphenhydramine Hcl, Gabapentin, Lisinopril, Topiramate, Pravastatin,  Sumatriptan Psychotropic Info: Paxil         Current Medications (11/24/2016):  This is the current hospital active medication list Current Facility-Administered Medications  Medication Dose Route Frequency Provider Last Rate Last Dose  . acetaminophen (TYLENOL) tablet 650 mg  650 mg Oral Q6H PRN Allena Katzhillips, Eric K, PA-C   650 mg at 11/23/16 1833   Or  . acetaminophen (TYLENOL) suppository 650 mg  650 mg Rectal Q6H PRN Allena KatzPhillips, Eric K, PA-C      . alum & mag hydroxide-simeth (MAALOX/MYLANTA) 200-200-20 MG/5ML suspension 30 mL  30 mL Oral Q4H PRN Allena KatzPhillips, Eric K, PA-C      . amLODipine (NORVASC) tablet 10 mg  10 mg Oral QAC breakfast Allena Katzhillips, Eric K, PA-C   10 mg at 11/23/16 0825  . apixaban (ELIQUIS) tablet 2.5 mg  2.5 mg Oral BID Gean Birchwoodowan, Frank, MD   2.5 mg at 11/24/16 0858  . bisacodyl (DULCOLAX) EC tablet 5 mg  5 mg Oral Daily PRN Allena KatzPhillips, Eric K, PA-C      . dextrose 5 % and 0.45 % NaCl with KCl 20 mEq/L infusion   Intravenous Continuous Dannielle BurnPhillips, Eric K, PA-C 20 mL/hr at 11/24/16 0000    . docusate sodium (COLACE) capsule 100 mg  100 mg Oral BID Allena Katzhillips, Eric K, PA-C   100 mg at 11/24/16 [id number]  . famotidine (PEPCID) tablet 20 mg  20 mg Oral BID Allena Katzhillips, Eric K, PA-C   20 mg at 11/24/16 0857  . fluticasone (FLONASE) 50 MCG/ACT nasal spray  1 spray  1 spray Each Nare QHS Allena Katz, PA-C   1 spray at 11/23/16 2236  . HYDROmorphone (DILAUDID) injection 1 mg  1 mg Intravenous Q2H PRN Allena Katz, PA-C   1 mg at 11/23/16 1842  . ketorolac (TORADOL) 30 MG/ML injection 15 mg  15 mg Intravenous Q6H Allena Katz, PA-C   15 mg at 11/24/16 1107  . lactated ringers infusion   Intravenous Continuous Val Eagle, MD 10 mL/hr at 11/22/16 1131    . menthol-cetylpyridinium (CEPACOL) lozenge 3 mg  1 lozenge Oral PRN Allena Katz, PA-C       Or  . phenol (CHLORASEPTIC) mouth spray 1 spray  1 spray Mouth/Throat PRN Allena Katz, PA-C      . methocarbamol (ROBAXIN) tablet 500  mg  500 mg Oral Q6H PRN Allena Katz, PA-C   500 mg at 11/23/16 1841   Or  . methocarbamol (ROBAXIN) 500 mg in dextrose 5 % 50 mL IVPB  500 mg Intravenous Q6H PRN Allena Katz, PA-C      . metoCLOPramide (REGLAN) tablet 5-10 mg  5-10 mg Oral Q8H PRN Allena Katz, PA-C       Or  . metoCLOPramide (REGLAN) injection 5-10 mg  5-10 mg Intravenous Q8H PRN Allena Katz, PA-C      . ondansetron [facility]) tablet 4 mg  4 mg Oral Q6H PRN Allena Katz, PA-C       Or  . ondansetron [facility]) injection 4 mg  4 mg Intravenous Q6H PRN Allena Katz, PA-C      . oxyCODONE (Oxy IR/ROXICODONE) immediate release tablet 5-10 mg  5-10 mg Oral Q3H PRN Allena Katz, PA-C   10 mg at 11/24/16 1217  . PARoxetine (PAXIL) tablet 5 mg  5 mg Oral BID Allena Katz, PA-C   5 mg at 11/24/16 1610  . pneumococcal 23 valent vaccine (PNU-IMMUNE) injection 0.5 mL  0.5 mL Intramuscular Tomorrow-1000 Gean Birchwood, MD      . senna-docusate (Senokot-S) tablet 1 tablet  1 tablet Oral QHS PRN Allena Katz, PA-C      . sodium phosphate (FLEET) 7-19 GM/118ML enema 1 enema  1 enema Rectal Once PRN Allena Katz, PA-C         Discharge Medications: Please see discharge summary for a list of discharge medications.  Relevant Imaging Results:  Relevant Lab Results:   Additional Information SSN [id number]   Brandy Cervantes, [place] [phone]

## 2016-11-24 NOTE — Progress Notes (Signed)
Physical Therapy Treatment Patient Details Name: Brandy FinlandJanice Cervantes MRN: [id number] DOB: [birth year] Today's Date: 11/24/2016    History of Present Illness Pt is a 71 yo female c/o bilateral knee pain secondary to end stage bilateral knee OA. 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 PMH of OA, HTN and migraine.     PT Comments    Pt is progressing towards her goals. Pt currently modAx1 for bed mobility, modAx2 for sit>stand, modAx2 for ambulation of 15 feet with RW. Pt requires skilled PT to progress transfer and gait training and to improve LE strength, ROM and endurance to be able to safely navigate her discharge environment.    Follow Up Recommendations  SNF     Equipment Recommendations   (to be determined at next venue)       Precautions / Restrictions Precautions Precautions: Knee Precaution Booklet Issued: No Restrictions Weight Bearing Restrictions: Yes RLE Weight Bearing: Weight bearing as tolerated LLE Weight Bearing: Weight bearing as tolerated    Mobility  Bed Mobility Overal bed mobility: Needs Assistance Bed Mobility: Sit to Supine     Supine to sit: Mod assist     General bed mobility comments: pt requires mod Ax1 for management of LE to floor.   Transfers Overall transfer level: Needs assistance Equipment used: Rolling walker (2 wheeled) Transfers: Sit to/from Stand Sit to Stand: +2 physical assistance;Mod assist         General transfer comment: modAx2 for power up and steadying in upright, vc for hand placement, not looking down at feet, anterior pelvic tilt to come all the way to upright, increased UE use to decrease weightbearing through LE  Ambulation/Gait Ambulation/Gait assistance: Mod assist;+2 physical assistance Ambulation Distance (Feet): 15 Feet Assistive device: Rolling walker (2 wheeled) Gait Pattern/deviations: Step-to pattern;Decreased step length - right;Decreased step length - left;Antalgic;Trunk flexed Gait velocity: slowed Gait velocity interpretation:  Below normal speed for age/gender General Gait Details: pt with difficulty advancing LE L>R vc for upright posture, anterior pelvic tilt, increased UE support, sequencing of walker and LE, and staying inside walker     Balance Overall balance assessment: Needs assistance Sitting-balance support: No upper extremity supported;Feet supported Sitting balance-Leahy Scale: Fair Sitting balance - Comments: able to sit edge of recliner  Postural control: Posterior lean (due to pain) Standing balance support: During functional activity;Bilateral upper extremity supported Standing balance-Leahy Scale: Poor Standing balance comment: Reliant on RW and external assist.                            Cognition Arousal/Alertness: Awake/alert Behavior During Therapy: Anxious Overall Cognitive Status: Within Functional Limits for tasks assessed                                 General Comments: pt continues to be anxious about movement      Exercises Total Joint Exercises Ankle Circles/Pumps: AROM;Both;10 reps;Supine Quad Sets: AROM;10 reps;Both;Seated Heel Slides: AROM;10 reps;Seated Long Arc Quad: AROM;Both;5 reps Knee Flexion: AROM;Both;5 reps;Seated Goniometric ROM: R knee ROM 8 to 98 degrees in recliner, L knee ROM 10 to 78 degrees in recliner    General Comments        Pertinent Vitals/Pain Pain Assessment: 0-10 Pain Score: 7  Pain Location: bilateral knees Pain Descriptors / Indicators: Constant;Burning;Pressure Pain Intervention(s): Limited activity within patient's tolerance;Monitored during session;Ice applied;Premedicated before session  VSS  PT Goals (current goals can now be found in the care plan section) Acute Rehab PT Goals Patient Stated Goal: To have less pain PT Goal Formulation: With patient Time For Goal Achievement: 12/07/16 Potential to Achieve Goals: Good Progress towards PT goals: Progressing toward goals    Frequency     7X/week      PT Plan Discharge plan needs to be updated       AM-PAC PT "6 Clicks" Daily Activity  Outcome Measure  Difficulty turning over in bed (including adjusting bedclothes, sheets and blankets)?: Total Difficulty moving from lying on back to sitting on the side of the bed? : Total Difficulty sitting down on and standing up from a chair with arms (e.g., wheelchair, bedside commode, etc,.)?: Total Help needed moving to and from a bed to chair (including a wheelchair)?: A Lot Help needed walking in hospital room?: A Lot Help needed climbing 3-5 steps with a railing? : Total 6 Click Score: 8    End of Session Equipment Utilized During Treatment: Gait belt Activity Tolerance: Patient limited by pain Patient left: in chair;with call bell/phone within reach;with family/visitor present Nurse Communication: Mobility status;Weight bearing status PT Visit Diagnosis: Unsteadiness on feet (R26.81);Other abnormalities of gait and mobility (R26.89);Muscle weakness (generalized) (M62.81);Pain Pain - Right/Left: Right (Left) Pain - part of body: Knee     Time: 1014-1040 PT Time Calculation (min) (ACUTE ONLY): 26 min  Charges:  $Gait Training: 8-22 mins $Therapeutic Exercise: 8-22 mins                    G Codes:       Jamekia Gannett B. Beverely Risen PT, DPT Acute Rehabilitation  [phone] Pager [phone]     Elon Alas Fleet 11/24/2016, 2:44 PM

## 2016-11-25 LAB — CBC
HCT: 24.8 % — ABNORMAL LOW (ref 36.0–46.0)
Hemoglobin: 8.4 g/dL — ABNORMAL LOW (ref 12.0–15.0)
MCH: 31.1 pg (ref 26.0–34.0)
MCHC: 33.9 g/dL (ref 30.0–36.0)
MCV: 91.9 fL (ref 78.0–100.0)
Platelets: 227 10*3/uL (ref 150–400)
RBC: 2.7 MIL/uL — ABNORMAL LOW (ref 3.87–5.11)
RDW: 13.3 % (ref 11.5–15.5)
WBC: 10.6 10*3/uL — ABNORMAL HIGH (ref 4.0–10.5)

## 2016-11-25 NOTE — Clinical Social Work Note (Signed)
Clinical Social Work Assessment  Patient Details  Name: Brandy FinlandJanice Cervantes MRN: [id number] Date of Birth: [birth year]  Date of referral:  11/25/16               Reason for consult:  Facility Placement, Discharge Planning                Permission sought to share information with:  Case Manager, Magazine features editoracility Contact Representative, Family Supports Permission granted to share information::  Yes, Verbal Permission Granted  Name::        Agency::  [facility]   Relationship::  husband   Contact Information:     Housing/Transportation Living arrangements for the past 2 months:  Single Family Home Source of Information:  Patient, Medical Team, Case Manager, Spouse, Facility Patient Interpreter Needed:  None Criminal Activity/Legal Involvement Pertinent to Current Situation/Hospitalization:  No - Comment as needed Significant Relationships:  Other Family Members, Spouse Lives with:  Spouse Do you feel safe going back to the place where you live?  No Need for family participation in patient care:  Yes (Comment)  Care giving concerns:  Patient is from home with husband.  Recommendation is CIR vs SNF and CIR was first choice, however husband was unable to manage copays due to insurance. Family has decided to go with SNF: [facility]. Husband reports he has already spoken with administrator Herbert SetaHeather regarding status of beds. Patient accepted by [facility].    Social Worker assessment / plan:  Assessment completed via phone as RN called because patient is discharged this weekend and family asking about status of bed. Husband confirms plans for SNF and [facility]. LCSW has called facility to see if patient can admit over the weekend and message has been left.  Plan: SNF (pending call for patient to be moved today (weekend))  Employment status:  Retired Database administratornsurance information:  Managed Medicare PT Recommendations:  Skilled Holiday representativeursing Facility, Inpatient Rehab Consult Information / Referral to community resources:   Acute Rehab, Skilled Nursing Facility  Patient/Family's Response to care:  Understanding  Patient/Family's Understanding of and Emotional Response to Diagnosis, Current Treatment, and Prognosis:  Patient and husband eager to discharge today to begin therapy.  Emotional Assessment Appearance:  Appears stated age Attitude/Demeanor/Rapport:    Affect (typically observed):  Accepting, Adaptable Orientation:  Oriented to Self, Oriented to Place, Oriented to  Time, Oriented to Situation Alcohol / Substance use:  Not Applicable Psych involvement (Current and /or in the community):  No (Comment)  Discharge Needs  Concerns to be addressed:  No discharge needs identified Readmission within the last 30 days:  No Current discharge risk:  None Barriers to Discharge:  No Barriers Identified   Raye SorrowCoble, Alfonzo Arca N, LCSW 11/25/2016, 11:08 AM

## 2016-11-25 NOTE — Progress Notes (Signed)
Physical Therapy Treatment Patient Details Name: Brandy Cervantes MRN: [id number] DOB: [birth year] Today's Date: 11/25/2016    History of Present Illness Pt is a 71 yo female c/o bilateral knee pain secondary to end stage bilateral knee OA. 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 PMH of OA, HTN and migraine.     PT Comments    Pt making good progress with mobility/PT goals.  Currently requires min guard for STS transfers and ambulation.     Follow Up Recommendations  SNF     Equipment Recommendations       Recommendations for Other Services       Precautions / Restrictions Precautions Precautions: Knee Precaution Booklet Issued: No Restrictions RLE Weight Bearing: Weight bearing as tolerated LLE Weight Bearing: Weight bearing as tolerated    Mobility  Bed Mobility                  Transfers Overall transfer level: Needs assistance Equipment used: Rolling walker (2 wheeled) Transfers: Sit to/from Stand Sit to Stand: Min guard         General transfer comment: cues for hand placement  Ambulation/Gait Ambulation/Gait assistance: Min guard Ambulation Distance (Feet): 120 Feet Assistive device: Rolling walker (2 wheeled) Gait Pattern/deviations: Step-through pattern     General Gait Details: cues to stay closer to RW, relax shoulders.     Stairs            Wheelchair Mobility    Modified Rankin (Stroke Patients Only)       Balance                                            Cognition Arousal/Alertness: Awake/alert Behavior During Therapy: WFL for tasks assessed/performed Overall Cognitive Status: Within Functional Limits for tasks assessed                                        Exercises Total Joint Exercises Ankle Circles/Pumps: Both;10 reps Quad Sets: Both;10 reps Heel Slides: AROM;Strengthening;Both;10 reps Long Arc Quad: AROM;Strengthening;Both;10 reps    General Comments        Pertinent Vitals/Pain Pain  Assessment: 0-10 Pain Score: 8  Pain Location: bilateral knees Pain Descriptors / Indicators: Aching;Discomfort Pain Intervention(s): Limited activity within patient's tolerance;Monitored during session;Repositioned;Patient requesting pain meds-RN notified    Home Living                      Prior Function            PT Goals (current goals can now be found in the care plan section) Acute Rehab PT Goals Patient Stated Goal: To have less pain PT Goal Formulation: With patient Time For Goal Achievement: 12/07/16 Potential to Achieve Goals: Good Progress towards PT goals: Progressing toward goals    Frequency    7X/week      PT Plan Current plan remains appropriate    Co-evaluation              AM-PAC PT "6 Clicks" Daily Activity  Outcome Measure                   End of Session Equipment Utilized During Treatment: Gait belt Activity Tolerance: Patient tolerated treatment well Patient left: in chair;with call bell/phone within reach;with family/visitor present Nurse  Communication: Mobility status       Time: 1010-1030 PT Time Calculation (min) (ACUTE ONLY): 20 min  Charges:  $Gait Training: 8-22 mins                    G CodesVerdell Face:       [name], VirginiaPTA [phone] 11/25/2016    Lara Mulchooper, [name] 11/25/2016, 10:42 AM

## 2016-11-25 NOTE — Progress Notes (Signed)
Pt instructed to await RN return with discharge paperwork; informed pt & husband discharge ready, just needed to call report to facility before pt departure.  Understanding verbalized by both pt & husband, however, pt left unit with husband prior to RN return.  IV removed prior to discharge by NT with cath tip intact.  Report called to [facility] & given to Noemi ChapelAnnette Joan, RN, with understanding of SBAR/discharge summary verbalized.  Upon discovering pt departure prior to AVS review, facility called and notified; AVS faxed to facility with attention to April, whom stated would print and provide to pt.

## 2016-11-25 NOTE — Clinical Social Work Placement (Signed)
   CLINICAL SOCIAL WORK PLACEMENT  NOTE  Date:  11/25/2016  Patient Details  Name: Merilynn FinlandJanice Frakes MRN: [id number] Date of Birth: [birth year]  Clinical Social Work is seeking post-discharge placement for this patient at the Skilled  Nursing Facility level of care (*CSW will initial, date and re-position this form in  chart as items are completed):  Yes   Patient/family provided with [facility] Clinical Social Work Department's list of facilities offering this level of care within the geographic area requested by the patient (or if unable, by the patient's family).  Yes   Patient/family informed of their freedom to choose among providers that offer the needed level of care, that participate in Medicare, Medicaid or managed care program needed by the patient, have an available bed and are willing to accept the patient.  Yes   Patient/family informed of 's ownership interest in [facility] and [facility], as well as of the fact that they are under no obligation to receive care at these facilities.  PASRR submitted to EDS on 11/25/16     PASRR number received on 11/25/16     Existing PASRR number confirmed on       FL2 transmitted to all facilities in geographic area requested by pt/family on 11/25/16     FL2 transmitted to all facilities within larger geographic area on       Patient informed that his/her managed care company has contracts with or will negotiate with certain facilities, including the following:        Yes   Patient/family informed of bed offers received.  Patient chooses bed at [facility] [place]     Physician recommends and patient chooses bed at [facility] [place]    Patient to be transferred to [facility] [place] on 11/25/16.  Patient to be transferred to facility by     husband  Patient family notified on   of transfer.   husband  Name of family member notified:      Husband at bedside.  PHYSICIAN       Additional  Comment:    _______________________________________________ Raye Sorrowoble, Takari Duncombe N, LCSW 11/25/2016, 11:15 AM

## 2016-11-25 NOTE — Progress Notes (Signed)
Patient planning to discharge today:  SNF [facility]  Patient and husband agreeable to SNF. Spoke with Herbert SetaHeather at facility and sent over all clinicals through the [facility]. Agreeable to accept patient. Call placed to RN to notify plan and give report number:  [phone] Patient going to room 107.  Facility faxed scripts to get her medication Husband will take her to facility.  No other needs.  DC today.  Deretha EmoryHannah Sherrine Salberg LCSW, MSW Clinical Social Work: Optician, dispensingystem Wide Float Coverage for :  [phone]

## 2016-11-25 NOTE — Discharge Summary (Signed)
Physician Discharge Summary  Patient ID: Brandy Cervantes MRN: [id number] DOB/AGE: [birth year] 71 y.o.  Admit date: 11/22/2016 Discharge date: 11/25/2016  Admission Diagnoses:  Bilateral primary osteoarthritis of knee  Discharge Diagnoses:  Principal Problem:   Bilateral primary osteoarthritis of knee   Past Medical History:  Diagnosis Date  . Anxiety   . Arthritis    "all over" (11/23/2016)  . Chronic mid back pain   . Exposure to TB    "my mother died when she was 40 of TB"  . GERD (gastroesophageal reflux disease)   . Headache    HX MIGRAINES  . History of gout    "I've had it occasionally" (11/23/2016)  . Hypertension     Surgeries: Procedure(s): TOTAL KNEE BILATERAL on 11/22/2016   Consultants (if any):   Discharged Condition: Improved  Hospital Course: Brandy Cervantes is an 71 y.o. female who was admitted 11/22/2016 with a diagnosis of Bilateral primary osteoarthritis of knee and went to the operating room on 11/22/2016 and underwent the above named procedures.    She was given perioperative antibiotics:  Anti-infectives    Start     Dose/Rate Route Frequency Ordered Stop   11/22/16 1101  ceFAZolin (ANCEF) IVPB 2g/100 mL premix     2 g 200 mL/hr over 30 Minutes Intravenous On call to O.R. 11/22/16 1101 11/22/16 1247    .  She was given sequential compression devices, early ambulation, and Eliquis for DVT prophylaxis.  She benefited maximally from the hospital stay and there were no complications.  She desired SNF at follow-up instead of CIR due largely to financial differences  Recent vital signs:  Vitals:   11/24/16 2141 11/25/16 0423  BP: 120/66 (!) 112/53  Pulse: (!) 114 (!) 105  Resp: 18 18  Temp: 99 F (37.2 C) 98.5 F (36.9 C)    Recent laboratory studies:  Lab Results  Component Value Date   HGB 8.4 (L) 11/25/2016   HGB 8.6 (L) 11/24/2016   HGB 10.2 (L) 11/23/2016   Lab Results  Component Value Date   WBC 10.6 (H) 11/25/2016   PLT 227 11/25/2016    Lab Results  Component Value Date   INR 0.91 11/13/2016   Lab Results  Component Value Date   NA 131 (L) 11/23/2016   K 3.9 11/23/2016   CL 101 11/23/2016   CO2 22 11/23/2016   BUN 11 11/23/2016   CREATININE 0.61 11/23/2016   GLUCOSE 155 (H) 11/23/2016    Discharge Medications:   Allergies as of 11/25/2016      Reactions   Aspirin Other (See Comments)   Stomach pain   Alendronate Sodium Nausea And Vomiting   Benadryl [diphenhydramine Hcl]    Gabapentin Other (See Comments)   Stomach pain   Lisinopril Cough   Topiramate    Other reaction(s): Other (See Comments) depression   Pravastatin Rash   Sumatriptan Rash      Medication List    STOP taking these medications   aspirin-acetaminophen-caffeine 250-250-65 MG tablet Commonly known as:  EXCEDRIN MIGRAINE   cyclobenzaprine 5 MG tablet Commonly known as:  FLEXERIL   diclofenac sodium 1 % Gel Commonly known as:  VOLTAREN   HYDROcodone-acetaminophen 10-325 MG tablet Commonly known as:  NORCO   nabumetone 500 MG tablet Commonly known as:  RELAFEN     TAKE these medications   5-HTP 100 MG Caps Take 200-300 mg by mouth at bedtime.   amLODipine 10 MG tablet Commonly known as:  NORVASC Take 10  mg by mouth daily before breakfast.   apixaban 2.5 MG Tabs tablet Commonly known as:  ELIQUIS Take 1 tablet (2.5 mg total) by mouth 2 (two) times daily.   fluticasone 50 MCG/ACT nasal spray Commonly known as:  FLONASE Place 1 spray into both nostrils at bedtime.   methocarbamol 500 MG tablet Commonly known as:  ROBAXIN Take 1 tablet (500 mg total) by mouth 2 (two) times daily with a meal.   morphine 30 MG 12 hr tablet Commonly known as:  MS CONTIN Take 30 mg by mouth every 12 (twelve) hours.   OVER THE COUNTER MEDICATION Take 1 tablet by mouth at bedtime as needed (for sleep). Calms Forte   oxyCODONE-acetaminophen 5-325 MG tablet Commonly known as:  ROXICET Take 1-2 tablets by mouth every 4 (four) hours as  needed.   PARoxetine 10 MG tablet Commonly known as:  PAXIL Take 5 mg by mouth 2 (two) times daily.   ranitidine 150 MG tablet Commonly known as:  ZANTAC Take 150 mg by mouth 2 (two) times daily as needed (takes with nabumetone or excedrin for acid reducer.).   STOOL SOFTENER PO Take 1 capsule by mouth as needed (for constipation).   triamcinolone cream 0.1 % Commonly known as:  KENALOG Apply 1 application topically 2 (two) times daily as needed (for itching).   Vitamin D3 1000 units Caps Take 1,000 Units by mouth daily.   zolpidem 10 MG tablet Commonly known as:  AMBIEN Take 5 mg by mouth at bedtime as needed for sleep.            Durable Medical Equipment        Start     Ordered   11/22/16 1824  DME Walker rolling  Once    Question:  Patient needs a walker to treat with the following condition  Answer:  Bilateral primary osteoarthritis of knee   11/22/16 1823   11/22/16 1824  DME 3 n 1  Once     11/22/16 1823   11/22/16 1824  DME Bedside commode  Once    Question:  Patient needs a bedside commode to treat with the following condition  Answer:  Bilateral primary osteoarthritis of knee   11/22/16 1823      Diagnostic Studies: No results found.  Disposition: SNF    Follow-up Information    Gean Birchwoodowan, Frank, MD Follow up in 2 week(s).   Specialty:  Orthopedic Surgery Contact information: [address] [place] [address] [phone]            Signed: Janee MornHOMPSON, Sutton Plake A. 11/25/2016, 10:52 AM

## 2016-11-25 NOTE — Progress Notes (Addendum)
PATIENT ID: Brandy Cervantes  MRN: [id number]  DOB/AGE:  1946-03-26 / 71 y.o.  3 Days Post-Op Procedure(s) (LRB): TOTAL KNEE BILATERAL (Bilateral)    PROGRESS NOTE Subjective: Patient is alert, oriented, no Nausea, no Vomiting, yes passing gas. Taking PO well. Denies SOB, Chest or Calf Pain. Using Incentive Spirometer, PAS in place. Although pt accepted for inpt rehab, patient opts for SNF due to financial concerns   Ambulated with PT up/down hallway this am, now with some increased pain  Objective: Vital signs in last 24 hours: Vitals:   11/24/16 1407 11/24/16 1802 11/24/16 2141 11/25/16 0423  BP: (!) 113/58 (!) 138/50 120/66 (!) 112/53  Pulse: (!) 113 (!) 130 (!) 114 (!) 105  Resp: 16  18 18   Temp: 98.9 F (37.2 C) 97.8 F (36.6 C) 99 F (37.2 C) 98.5 F (36.9 C)  TempSrc: Oral Oral Oral Oral  SpO2: 98% 96% 95% 93%  Weight:      Height:          Intake/Output from previous day: I/O last 3 completed shifts: In: 360 [P.O.:360] Out: 1475 [Urine:1475]   Intake/Output this shift: Total I/O In: 400 [P.O.:400] Out: -    LABORATORY DATA:  Recent Labs  11/23/16 0530 11/24/16 0545 11/25/16 0700  WBC 13.6* 10.8* 10.6*  HGB 10.2* 8.6* 8.4*  HCT 31.6* 26.7* 24.8*  PLT 293 227 227  NA 131*  --   --   K 3.9  --   --   CL 101  --   --   CO2 22  --   --   BUN 11  --   --   CREATININE 0.61  --   --   GLUCOSE 155*  --   --   CALCIUM 8.2*  --   --     Examination: NVI Dressings with dry spottings  Assessment:   3 Days Post-Op Procedure(s) (LRB): TOTAL KNEE BILATERAL (Bilateral) ADDITIONAL DIAGNOSIS: Expected Acute Blood Loss Anemia, Chronic low back pain, chronic pain management  Plan: PT/OT WBAT, AROM and PROM  DVT Prophylaxis:  SCDx72hrs, eliquis 2.5 mg BID x 2 weeks DISCHARGE PLAN: SNF--FL-2 signed, awaiting bed offer/xfer arrangements DISCHARGE NEEDS: SNF bed Can be discharged anytime bed becomes available.     Brandy Cervantes A. 11/25/2016, 10:26 AM

## 2016-11-27 LAB — BPAM RBC
BLOOD PRODUCT EXPIRATION DATE: 201806012359
Blood Product Expiration Date: 201806012359
UNIT TYPE AND RH: 9500
Unit Type and Rh: 9500

## 2016-11-27 LAB — TYPE AND SCREEN
ABO/RH(D): O NEG
Antibody Screen: NEGATIVE
UNIT DIVISION: 0
Unit division: 0

## 2016-12-18 NOTE — Addendum Note (Signed)
Addendum  created 12/18/16 1456 by Isaish Alemu, MD   Sign clinical note    

## 2017-10-19 DIAGNOSIS — E782 Mixed hyperlipidemia: Secondary | ICD-10-CM

## 2017-10-19 HISTORY — DX: Mixed hyperlipidemia: E78.2

## 2017-12-31 DIAGNOSIS — R0789 Other chest pain: Secondary | ICD-10-CM

## 2017-12-31 DIAGNOSIS — R1310 Dysphagia, unspecified: Secondary | ICD-10-CM | POA: Insufficient documentation

## 2017-12-31 DIAGNOSIS — K219 Gastro-esophageal reflux disease without esophagitis: Secondary | ICD-10-CM | POA: Insufficient documentation

## 2017-12-31 DIAGNOSIS — R1319 Other dysphagia: Secondary | ICD-10-CM

## 2017-12-31 HISTORY — DX: Other dysphagia: R13.19

## 2017-12-31 HISTORY — DX: Other chest pain: R07.89

## 2017-12-31 HISTORY — DX: Gastro-esophageal reflux disease without esophagitis: K21.9

## 2018-01-24 DIAGNOSIS — R9431 Abnormal electrocardiogram [ECG] [EKG]: Secondary | ICD-10-CM

## 2018-01-24 HISTORY — DX: Abnormal electrocardiogram (ECG) (EKG): R94.31

## 2018-01-24 NOTE — Progress Notes (Signed)
Cardiology Office Note:    Date:  01/25/2018   ID:  Brandy FinlandJanice Cervantes, DOB 03-04-1946, MRN [id number]  PCP:  Brandy Cervantes, Jodi, PA-C  Cardiologist:  Brandy HerrlichBrian Merritt Kibby, MD   Referring MD: Brandy Cervantes, Jodi, PA-C  ASSESSMENT:    1. Unstable angina (HCC)   2. Abnormal EKG   3. Mixed hyperlipidemia   4. Essential hypertension    PLAN:    In order of problems listed above:  1. Clinically she has unstable angina with recent onset and recurrent rest episodes of typical angina associated with progressive ischemic EKG changes.  While in my office she has chest pain that waxes and wanes I would like to go the emergency room [facility] for quick assessment and if her troponin is elevated she be best served by referral to [facility] for further treatment including coronary angiography during the stay.  She is aspirin intolerant with GI upset however I would give her aspirin in the emergency room as a single dose awaiting her troponin level. 2. Progressive EKG changes consistent with cardiac injury  3. she will need initiation of a high intensity statin atorvastatin or rosuvastatin 4. Continue beta-blocker 5.   Next appointment   Medication Adjustments/Labs and Tests Ordered: Current medicines are reviewed at length with the patient today.  Concerns regarding medicines are outlined above.  No orders of the defined types were placed in this encounter.  No orders of the defined types were placed in this encounter.    Chief Complaint  Patient presents with  . New Patient (Initial Visit)  After evaluation in the ED and likely admission to the hospital is unstable angina  History of Present Illness:    Brandy Cervantes is a 72 y.o. female with hypertension esophageal reflux disease and hyperlipidemia who is being seen today for the evaluation of chest pain and an abnormal EKG with new ischemic T wave inversion at the request of Brandy Cervantes, Jodi, PA-C.  For approximately 3 weeks she has had  recurrent episodes of substernal chest tightness pressure initially quite severe and prolonged occurred at rest but since started on a beta-blocker the episodes are less severe and prolonged but if to continue to recur and actually had rest pain in my office today.  There is no radiation to the arm or neck no associated shortness of breath diaphoresis nausea or vomiting it is related both to physical activity occurs at rest but not nocturnal and she has not taken nitroglycerin.  She has no known history of heart disease. Past Medical History:  Diagnosis Date  . Anxiety   . Arthritis    "all over" (11/23/2016)  . 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 Bilateral knee pain 10/28/2013  . Bilateral primary osteoarthritis of knee 11/17/2016  . Chronic mid back pain   . Chronic pain 10/18/2015  . Degenerative disc disease, cervical 10/28/2013  . Degenerative disc disease, lumbar 10/28/2013  . Esophageal dysphagia 12/31/2017  . Essential hypertension 10/18/2015  . Exposure to TB    "my mother died when she was [age] of TB"  . GAD (generalized anxiety disorder) 10/18/2015  . GERD (gastroesophageal reflux disease) 12/31/2017  . Headache    HX MIGRAINES  . History of gout    "I've had it occasionally" (11/23/2016)  . Hypertension   . Insomnia 10/18/2015  . Low back pain 10/28/2013  . Mixed hyperlipidemia 10/19/2017  . Neck pain 10/28/2013  . Osteoarthritis 10/18/2015  . Osteoporosis 10/18/2015  . Other chest pain 12/31/2017  .  Pain syndrome, chronic 10/28/2013    Past Surgical History:  Procedure Laterality Date  . JOINT REPLACEMENT    . MASTOIDECTOMY Right ~ 1955  . TOTAL KNEE ARTHROPLASTY Bilateral 11/22/2016   Procedure: TOTAL KNEE BILATERAL;  Surgeon: Gean Birchwood, MD;  Location: [facility] OR;  Service: Orthopedics;  Laterality: Bilateral;    Current Medications: Current Meds  Medication Sig  . amLODipine (NORVASC) 10 MG tablet Take 10 mg by mouth daily before breakfast.  . Ascorbic Acid (VITAMIN C) 1000 MG  tablet Take 1,000 mg by mouth daily.  . carvedilol (COREG) 3.125 MG tablet Take 1 tablet by mouth 2 (two) times daily.  . Cholecalciferol (VITAMIN D3) 1000 units CAPS Take 1,000 Units by mouth daily.  . cyclobenzaprine (FLEXERIL) 5 MG tablet Take 5 mg by mouth daily as needed.  Tery Sanfilippo Calcium (STOOL SOFTENER PO) Take 1 capsule by mouth as needed (for constipation).  . fluticasone (FLONASE) 50 MCG/ACT nasal spray Place 2 sprays into both nostrils daily as needed for allergies.   Marland Kitchen HYDROcodone-Acetaminophen (ZAMICET) 10-325 MG/15ML SOLN Frequency:   Dosage:0   MG  Instructions:  Note:TAKE 1/2-1 TAB EVERY 6 HOURS AS NEEDED. Due for refill Sept 22  . morphine (MS CONTIN) 30 MG 12 hr tablet Take 30 mg by mouth every 12 (twelve) hours.  . Multiple Vitamins-Minerals (SENIOR MULTIVITAMIN PLUS) TABS Take 1 tablet by mouth daily.  . Multiple Vitamins-Minerals (VISION FORMULA EYE HEALTH PO) Take 1 tablet by mouth daily.  . nabumetone (RELAFEN) 500 MG tablet Take 500 mg by mouth 2 (two) times daily as needed for mild pain.  Marland Kitchen OVER THE COUNTER MEDICATION Take 1 tablet by mouth at bedtime as needed (for sleep). Calms Forte  . PARoxetine (PAXIL) 10 MG tablet Take 5 mg by mouth 2 (two) times daily.  . ranitidine (ZANTAC) 150 MG tablet Take 150 mg by mouth 2 (two) times daily as needed (takes with nabumetone or excedrin for acid reducer.).   Marland Kitchen triamcinolone cream (KENALOG) 0.1 % Apply 1 application topically 2 (two) times daily as needed (for itching).   . Ubiquinol (QUNOL COQ10/UBIQUINOL/MEGA) 100 MG CAPS Take 1 capsule by mouth daily.  Marland Kitchen zolpidem (AMBIEN) 10 MG tablet Take 5 mg by mouth at bedtime as needed for sleep.     Allergies:   Aspirin; Lisinopril; Topiramate; Alendronate sodium; Celecoxib; Diphenhydramine hcl; Gabapentin; Nsaids; Pravastatin; and Sumatriptan   Social History   Socioeconomic History  . Marital status: Married    Spouse name: Not on file  . Number of children: Not on file  .  Years of education: Not on file  . Highest education level: Not on file  Occupational History  . Not on file  Social Needs  . Financial resource strain: Not on file  . Food insecurity:    Worry: Not on file    Inability: Not on file  . Transportation needs:    Medical: Not on file    Non-medical: Not on file  Tobacco Use  . Smoking status: Never Smoker  . Smokeless tobacco: Never Used  Substance and Sexual Activity  . Alcohol use: No  . Drug use: No  . Sexual activity: Not Currently  Lifestyle  . Physical activity:    Days per week: Not on file    Minutes per session: Not on file  . Stress: Not on file  Relationships  . Social connections:    Talks on phone: Not on file    Gets together: Not on file  Attends religious service: Not on file    Active member of club or organization: Not on file    Attends meetings of clubs or organizations: Not on file    Relationship status: Not on file  Other Topics Concern  . Not on file  Social History Narrative  . Not on file     Family History: The patient's family history includes Heart attack in her father; Heart disease in her brother; Hyperlipidemia in her father; Tuberculosis in her mother.  ROS:   Review of Systems  Constitution: Positive for malaise/fatigue.  HENT: Negative.   Eyes: Negative.   Cardiovascular: Positive for chest pain.  Respiratory: Positive for shortness of breath.   Endocrine: Negative.   Hematologic/Lymphatic: Negative.   Skin: Negative.   Musculoskeletal: Positive for joint pain.  Gastrointestinal: Negative.   Genitourinary: Negative.   Neurological: Negative.   Psychiatric/Behavioral: Negative.   Allergic/Immunologic: Negative.    Please see the history of present illness.     All other systems reviewed and are negative.  EKGs/Labs/Other Studies Reviewed:    The following studies were reviewed today:  EKG  12/31/2017 sinus rhythm no ischemic anterior T wave inversion EKG:  EKG is   ordered today.  The ekg ordered today demonstrates progressive ischemic T wave changes LDL 197 07/24/13  CBC and CMP normal on 10/24/16 Recent Labs: No results found for requested labs within last 8760 hours.  Recent Lipid Panel No results found for: CHOL, TRIG, HDL, CHOLHDL, VLDL, LDLCALC, LDLDIRECT  Physical Exam:    VS:  BP (!) 88/68 (BP Location: Left Arm, Patient Position: Sitting, Cuff Size: Normal)   Pulse 79   Ht 5' 3.5" (1.613 m)   Wt 139 lb 12.8 oz (63.4 kg)   SpO2 98%   BMI 24.38 kg/m     Wt Readings from Last 3 Encounters:  01/25/18 139 lb 12.8 oz (63.4 kg)  11/22/16 144 lb (65.3 kg)  11/13/16 144 lb 8 oz (65.5 kg)     GEN:  Well nourished, well developed in no acute distress HEENT: Normal NECK: No JVD; No carotid bruits LYMPHATICS: No lymphadenopathy CARDIAC: RRR, no murmurs, rubs, gallops RESPIRATORY:  Clear to auscultation without rales, wheezing or rhonchi  ABDOMEN: Soft, non-tender, non-distended MUSCULOSKELETAL:  No edema; No deformity  SKIN: Warm and dry NEUROLOGIC:  Alert and oriented x 3 PSYCHIATRIC:  Normal affect     Signed, Brandy Herrlich, MD  01/25/2018 10:42 AM    [facility]

## 2018-01-25 ENCOUNTER — Encounter: Payer: Self-pay | Admitting: Cardiology

## 2018-01-25 ENCOUNTER — Ambulatory Visit: Payer: Medicare Other | Admitting: Cardiology

## 2018-01-25 VITALS — BP 88/68 | HR 79 | Ht 63.5 in | Wt 139.8 lb

## 2018-01-25 DIAGNOSIS — I200 Unstable angina: Secondary | ICD-10-CM

## 2018-01-25 DIAGNOSIS — I10 Essential (primary) hypertension: Secondary | ICD-10-CM | POA: Diagnosis not present

## 2018-01-25 DIAGNOSIS — E782 Mixed hyperlipidemia: Secondary | ICD-10-CM

## 2018-01-25 DIAGNOSIS — R9431 Abnormal electrocardiogram [ECG] [EKG]: Secondary | ICD-10-CM | POA: Diagnosis not present

## 2018-01-25 DIAGNOSIS — R079 Chest pain, unspecified: Secondary | ICD-10-CM

## 2018-01-26 DIAGNOSIS — E785 Hyperlipidemia, unspecified: Secondary | ICD-10-CM

## 2018-01-26 DIAGNOSIS — I200 Unstable angina: Secondary | ICD-10-CM

## 2018-01-26 DIAGNOSIS — I110 Hypertensive heart disease with heart failure: Secondary | ICD-10-CM

## 2018-01-26 DIAGNOSIS — R0602 Shortness of breath: Secondary | ICD-10-CM

## 2018-01-26 DIAGNOSIS — R079 Chest pain, unspecified: Secondary | ICD-10-CM | POA: Diagnosis not present

## 2018-01-26 DIAGNOSIS — I5032 Chronic diastolic (congestive) heart failure: Secondary | ICD-10-CM

## 2018-01-27 ENCOUNTER — Inpatient Hospital Stay (HOSPITAL_COMMUNITY)
Admission: AD | Admit: 2018-01-27 | Discharge: 2018-01-30 | DRG: 247 | Disposition: A | Discharge status: Home / Self Care | Payer: Medicare Other | Source: Other Acute Inpatient Hospital | Attending: Internal Medicine | Admitting: Internal Medicine

## 2018-01-27 DIAGNOSIS — M810 Age-related osteoporosis without current pathological fracture: Secondary | ICD-10-CM | POA: Diagnosis present

## 2018-01-27 DIAGNOSIS — F411 Generalized anxiety disorder: Secondary | ICD-10-CM | POA: Diagnosis not present

## 2018-01-27 DIAGNOSIS — I200 Unstable angina: Secondary | ICD-10-CM

## 2018-01-27 DIAGNOSIS — E782 Mixed hyperlipidemia: Secondary | ICD-10-CM | POA: Diagnosis not present

## 2018-01-27 DIAGNOSIS — Z201 Contact with and (suspected) exposure to tuberculosis: Secondary | ICD-10-CM | POA: Diagnosis not present

## 2018-01-27 DIAGNOSIS — I5032 Chronic diastolic (congestive) heart failure: Secondary | ICD-10-CM | POA: Diagnosis not present

## 2018-01-27 DIAGNOSIS — G4700 Insomnia, unspecified: Secondary | ICD-10-CM | POA: Diagnosis present

## 2018-01-27 DIAGNOSIS — E785 Hyperlipidemia, unspecified: Secondary | ICD-10-CM

## 2018-01-27 DIAGNOSIS — I10 Essential (primary) hypertension: Secondary | ICD-10-CM | POA: Diagnosis not present

## 2018-01-27 DIAGNOSIS — I7300 Raynaud's syndrome without gangrene: Secondary | ICD-10-CM | POA: Diagnosis present

## 2018-01-27 DIAGNOSIS — I110 Hypertensive heart disease with heart failure: Secondary | ICD-10-CM | POA: Diagnosis not present

## 2018-01-27 DIAGNOSIS — R0609 Other forms of dyspnea: Secondary | ICD-10-CM | POA: Diagnosis not present

## 2018-01-27 DIAGNOSIS — I25110 Atherosclerotic heart disease of native coronary artery with unstable angina pectoris: Secondary | ICD-10-CM | POA: Diagnosis not present

## 2018-01-27 DIAGNOSIS — M5136 Other intervertebral disc degeneration, lumbar region: Secondary | ICD-10-CM | POA: Diagnosis not present

## 2018-01-27 DIAGNOSIS — R079 Chest pain, unspecified: Secondary | ICD-10-CM

## 2018-01-27 DIAGNOSIS — Z96653 Presence of artificial knee joint, bilateral: Secondary | ICD-10-CM | POA: Diagnosis present

## 2018-01-27 DIAGNOSIS — Z79891 Long term (current) use of opiate analgesic: Secondary | ICD-10-CM | POA: Diagnosis not present

## 2018-01-27 DIAGNOSIS — G8929 Other chronic pain: Secondary | ICD-10-CM | POA: Diagnosis present

## 2018-01-27 DIAGNOSIS — Z8249 Family history of ischemic heart disease and other diseases of the circulatory system: Secondary | ICD-10-CM | POA: Diagnosis not present

## 2018-01-27 DIAGNOSIS — Z888 Allergy status to other drugs, medicaments and biological substances status: Secondary | ICD-10-CM

## 2018-01-27 DIAGNOSIS — Z79899 Other long term (current) drug therapy: Secondary | ICD-10-CM

## 2018-01-27 DIAGNOSIS — Z8349 Family history of other endocrine, nutritional and metabolic diseases: Secondary | ICD-10-CM

## 2018-01-27 DIAGNOSIS — Z955 Presence of coronary angioplasty implant and graft: Secondary | ICD-10-CM

## 2018-01-27 DIAGNOSIS — M5030 Other cervical disc degeneration, unspecified cervical region: Secondary | ICD-10-CM | POA: Diagnosis not present

## 2018-01-27 DIAGNOSIS — Z831 Family history of other infectious and parasitic diseases: Secondary | ICD-10-CM

## 2018-01-27 DIAGNOSIS — Z886 Allergy status to analgesic agent status: Secondary | ICD-10-CM

## 2018-01-27 DIAGNOSIS — K219 Gastro-esophageal reflux disease without esophagitis: Secondary | ICD-10-CM | POA: Diagnosis present

## 2018-01-27 DIAGNOSIS — Z8679 Personal history of other diseases of the circulatory system: Secondary | ICD-10-CM

## 2018-01-27 HISTORY — DX: Depression, unspecified: F32.A

## 2018-01-27 HISTORY — DX: Dorsalgia, unspecified: M54.9

## 2018-01-27 HISTORY — DX: Major depressive disorder, single episode, unspecified: F32.9

## 2018-01-27 HISTORY — DX: Unstable angina: I20.0

## 2018-01-27 HISTORY — DX: Migraine, unspecified, not intractable, without status migrainosus: G43.909

## 2018-01-27 HISTORY — DX: Raynaud's syndrome without gangrene: I73.00

## 2018-01-27 HISTORY — DX: Personal history of other diseases of the circulatory system: Z86.79

## 2018-01-27 HISTORY — DX: Other chronic pain: G89.29

## 2018-01-27 LAB — COMPREHENSIVE METABOLIC PANEL
ALK PHOS: 100 U/L (ref 38–126)
ALT: 29 U/L (ref 0–44)
AST: 34 U/L (ref 15–41)
Albumin: 3.5 g/dL (ref 3.5–5.0)
Anion gap: 11 (ref 5–15)
BILIRUBIN TOTAL: 0.5 mg/dL (ref 0.3–1.2)
BUN: 7 mg/dL — ABNORMAL LOW (ref 8–23)
CALCIUM: 9 mg/dL (ref 8.9–10.3)
CO2: 26 mmol/L (ref 22–32)
CREATININE: 0.66 mg/dL (ref 0.44–1.00)
Chloride: 106 mmol/L (ref 98–111)
Glucose, Bld: 123 mg/dL — ABNORMAL HIGH (ref 70–99)
Potassium: 4.2 mmol/L (ref 3.5–5.1)
Sodium: 143 mmol/L (ref 135–145)
TOTAL PROTEIN: 6.7 g/dL (ref 6.5–8.1)

## 2018-01-27 LAB — TROPONIN I: Troponin I: <0.03 ng/mL (ref <0.03)

## 2018-01-27 LAB — HEMOGLOBIN A1C
Hgb A1c MFr Bld: 5.7 % — ABNORMAL HIGH (ref 4.8–5.6)
Mean Plasma Glucose: 116.89 mg/dL

## 2018-01-27 LAB — MAGNESIUM: MAGNESIUM: 2.4 mg/dL (ref 1.7–2.4)

## 2018-01-27 LAB — TSH: TSH: 3.068 u[IU]/mL (ref 0.350–4.500)

## 2018-01-27 MED ORDER — VISION FORMULA EYE HEALTH PO CAPS: ORAL_CAPSULE | Freq: Every day | ORAL | Status: DC

## 2018-01-27 MED ORDER — VITAMIN C 500 MG PO TABS
1000.0000 mg | ORAL_TABLET | Freq: Every day | ORAL | Status: DC
  Administered 2018-01-28 – 2018-01-30 (×3): 1000 mg via ORAL
  Filled 2018-01-27 (×3): qty 2

## 2018-01-27 MED ORDER — VITAMIN D 1000 UNITS PO TABS
1000.0000 [IU] | ORAL_TABLET | Freq: Every day | ORAL | Status: DC
  Administered 2018-01-28 – 2018-01-30 (×3): 1000 [IU] via ORAL
  Filled 2018-01-27 (×4): qty 1

## 2018-01-27 MED ORDER — FAMOTIDINE 20 MG PO TABS
20.0000 mg | ORAL_TABLET | Freq: Two times a day (BID) | ORAL | Status: DC
  Administered 2018-01-29 (×2): 20 mg via ORAL
  Filled 2018-01-27 (×6): qty 1

## 2018-01-27 MED ORDER — PAROXETINE HCL 10 MG PO TABS
5.0000 mg | ORAL_TABLET | Freq: Two times a day (BID) | ORAL | Status: DC
  Administered 2018-01-27 – 2018-01-30 (×6): 5 mg via ORAL
  Filled 2018-01-27 (×7): qty 0.5

## 2018-01-27 MED ORDER — ONDANSETRON HCL 4 MG/2ML IJ SOLN
4.0000 mg | Freq: Four times a day (QID) | INTRAMUSCULAR | Status: DC | PRN
  Filled 2018-01-27: qty 2

## 2018-01-27 MED ORDER — MORPHINE SULFATE ER 30 MG PO TBCR
30.0000 mg | EXTENDED_RELEASE_TABLET | Freq: Two times a day (BID) | ORAL | Status: DC
  Administered 2018-01-27 – 2018-01-30 (×6): 30 mg via ORAL
  Filled 2018-01-27 (×2): qty 2
  Filled 2018-01-27 (×2): qty 1
  Filled 2018-01-27 (×2): qty 2

## 2018-01-27 MED ORDER — UBIQUINOL 100 MG PO CAPS: 1.0000 | ORAL_CAPSULE | Freq: Every day | ORAL | Status: DC

## 2018-01-27 MED ORDER — PROSIGHT PO TABS
1.0000 | ORAL_TABLET | Freq: Every day | ORAL | Status: DC
  Administered 2018-01-28 – 2018-01-30 (×3): 1 via ORAL
  Filled 2018-01-27 (×3): qty 1

## 2018-01-27 MED ORDER — CYCLOBENZAPRINE HCL 10 MG PO TABS
5.0000 mg | ORAL_TABLET | Freq: Every day | ORAL | Status: DC | PRN
  Administered 2018-01-29: 5 mg via ORAL
  Filled 2018-01-27: qty 1

## 2018-01-27 MED ORDER — ZOLPIDEM TARTRATE 5 MG PO TABS
5.0000 mg | ORAL_TABLET | Freq: Every evening | ORAL | Status: DC | PRN
  Administered 2018-01-27 – 2018-01-29 (×3): 5 mg via ORAL
  Filled 2018-01-27 (×3): qty 1

## 2018-01-27 MED ORDER — NITROGLYCERIN 0.4 MG SL SUBL: 0.4000 mg | SUBLINGUAL_TABLET | SUBLINGUAL | Status: DC | PRN

## 2018-01-27 MED ORDER — ACETAMINOPHEN 325 MG PO TABS
650.0000 mg | ORAL_TABLET | ORAL | Status: DC | PRN
  Administered 2018-01-27 – 2018-01-28 (×2): 650 mg via ORAL
  Filled 2018-01-27 (×2): qty 2

## 2018-01-27 MED ORDER — FLUTICASONE PROPIONATE 50 MCG/ACT NA SUSP
2.0000 | Freq: Every day | NASAL | Status: DC | PRN
  Administered 2018-01-27 – 2018-01-29 (×2): 2 via NASAL
  Filled 2018-01-27 (×2): qty 16

## 2018-01-27 MED ORDER — HEPARIN (PORCINE) IN NACL 100-0.45 UNIT/ML-% IJ SOLN
750.0000 [IU]/h | INTRAMUSCULAR | Status: DC
  Administered 2018-01-27 – 2018-01-29 (×2): 750 [IU]/h via INTRAVENOUS
  Filled 2018-01-27 (×2): qty 250

## 2018-01-27 MED ORDER — HYDROCODONE-ACETAMINOPHEN 10-325 MG PO TABS
1.0000 | ORAL_TABLET | Freq: Four times a day (QID) | ORAL | Status: DC | PRN
  Administered 2018-01-28 – 2018-01-29 (×2): 1 via ORAL
  Filled 2018-01-27 (×2): qty 1

## 2018-01-27 MED ORDER — CARVEDILOL 3.125 MG PO TABS
3.1250 mg | ORAL_TABLET | Freq: Two times a day (BID) | ORAL | Status: DC
  Administered 2018-01-27 – 2018-01-30 (×6): 3.125 mg via ORAL
  Filled 2018-01-27 (×6): qty 1

## 2018-01-27 MED ORDER — AMLODIPINE BESYLATE 10 MG PO TABS
10.0000 mg | ORAL_TABLET | Freq: Every day | ORAL | Status: DC
  Administered 2018-01-28 – 2018-01-30 (×3): 10 mg via ORAL
  Filled 2018-01-27 (×3): qty 1

## 2018-01-27 MED ORDER — NABUMETONE 500 MG PO TABS
500.0000 mg | ORAL_TABLET | Freq: Two times a day (BID) | ORAL | Status: DC | PRN
  Administered 2018-01-29: 21:00:00 500 mg via ORAL
  Filled 2018-01-27 (×3): qty 1

## 2018-01-27 NOTE — H&P (Signed)
History and Physical   Patient ID: Brandy Cervantes, MRN: [id number], DOB: 22-Feb-1946   Date of Encounter: 01/27/2018, 6:23 PM  Primary Care Provider: Kyla Balzarine, PA-C Cardiologist: Dulce Sellar   Chief Complaint:  CP, abn echo findings  History of Present Illness: Brandy Cervantes is a 73 y.o. female w/ h/o HTN, dyslipidemia but no prior h/o CAD transferred from OSH for chest discomfort. She states she has had CP intermittently for more than 6 months; it has recently gotten worse over the past 3-4 weeks. She describes a midsternal burning or pressure type discomfort that comes on mostly after eating, but has happened a few times over the last few weeks w/o eating. It can be associated with SOB and diaphoresis. It lasts about 5-6 min before resolving. Sometimes baking soda will relieve the chest discomfort. She also describes DOE; strenuous activity, or even routine house or yard work, will make her SOB to the point of having to stop and rest for a few minutes. The CP does not always occur with the DOE.  At [facility], EKG showed possible new ischemic changes, and TTE showed RWMA suggestive of possible ischemia. LHC is planned for the AM.  Past Medical History:  Diagnosis Date  . Anxiety   . Arthritis    "all over" (11/23/2016)  . 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 Bilateral knee pain 10/28/2013  . Bilateral primary osteoarthritis of knee 11/17/2016  . Chronic mid back pain   . Chronic pain 10/18/2015  . Degenerative disc disease, cervical 10/28/2013  . Degenerative disc disease, lumbar 10/28/2013  . Esophageal dysphagia 12/31/2017  . Essential hypertension 10/18/2015  . Exposure to TB    "my mother died when she was 74 of TB"  . GAD (generalized anxiety disorder) 10/18/2015  . GERD (gastroesophageal reflux disease) 12/31/2017  . Headache    HX MIGRAINES  . History of gout    "I've had it occasionally" (11/23/2016)  . Hypertension   . Insomnia 10/18/2015  . Low back pain 10/28/2013  . Mixed  hyperlipidemia 10/19/2017  . Neck pain 10/28/2013  . Osteoarthritis 10/18/2015  . Osteoporosis 10/18/2015  . Other chest pain 12/31/2017  . Pain syndrome, chronic 10/28/2013    Past Surgical History:  Procedure Laterality Date  . JOINT REPLACEMENT    . MASTOIDECTOMY Right ~ 1955  . TOTAL KNEE ARTHROPLASTY Bilateral 11/22/2016   Procedure: TOTAL KNEE BILATERAL;  Surgeon: Gean Birchwood, MD;  Location: [facility] OR;  Service: Orthopedics;  Laterality: Bilateral;     Prior to Admission medications   Medication Sig Start Date End Date Taking? Authorizing Provider  amLODipine (NORVASC) 10 MG tablet Take 10 mg by mouth daily before breakfast. 08/30/16  Yes [provider]  Ascorbic Acid (VITAMIN C) 1000 MG tablet Take 1,000 mg by mouth daily.   Yes [provider]  carvedilol (COREG) 3.125 MG tablet Take 3.125 mg by mouth 2 (two) times daily.  12/31/17  Yes [provider]  Cholecalciferol (VITAMIN D3) 1000 units CAPS Take 1,000 Units by mouth daily.   Yes [provider]  cyclobenzaprine (FLEXERIL) 5 MG tablet Take 5 mg by mouth daily as needed. 04/03/17  Yes [provider]  Docusate Calcium (STOOL SOFTENER PO) Take 1 capsule by mouth as needed (for constipation).   Yes [provider]  fluticasone (FLONASE) 50 MCG/ACT nasal spray Place 2 sprays into both nostrils daily as needed for allergies.  08/04/13  Yes [provider]  HYDROcodone-acetaminophen (NORCO) 10-325 MG tablet Take 1 tablet  by mouth every 6 (six) hours as needed for moderate pain.   Yes [provider]  morphine (MS CONTIN) 30 MG 12 hr tablet Take 30 mg by mouth every 12 (twelve) hours.   Yes [provider]  Multiple Vitamins-Minerals (SENIOR MULTIVITAMIN PLUS) TABS Take 1 tablet by mouth daily.   Yes [provider]  Multiple Vitamins-Minerals (VISION FORMULA EYE HEALTH PO) Take 1 tablet by mouth daily.   Yes [provider]  nabumetone (RELAFEN) 500  MG tablet Take 500 mg by mouth 2 (two) times daily as needed for mild pain.   Yes [provider]  OVER THE COUNTER MEDICATION Take 1 tablet by mouth at bedtime as needed (for sleep). Calms Forte   Yes [provider]  PARoxetine (PAXIL) 10 MG tablet Take 5 mg by mouth 2 (two) times daily. 08/22/16  Yes [provider]  ranitidine (ZANTAC) 150 MG tablet Take 150 mg by mouth 2 (two) times daily as needed (takes with nabumetone or excedrin for acid reducer.).    Yes [provider]  triamcinolone cream (KENALOG) 0.1 % Apply 1 application topically 2 (two) times daily as needed (for itching).  10/26/16  Yes [provider]  Ubiquinol (QUNOL COQ10/UBIQUINOL/MEGA) 100 MG CAPS Take 1 capsule by mouth daily.   Yes [provider]  zolpidem (AMBIEN) 10 MG tablet Take 5 mg by mouth at bedtime as needed for sleep.   Yes [provider]     Allergies: Allergies  Allergen Reactions  . Aspirin Other (See Comments) and Nausea Only    Stomach pain Stomach pain Stomach pain   . Lisinopril Cough  . Topiramate     Other reaction(s): Other (See Comments) depression  . Alendronate Sodium Nausea And Vomiting  . Celecoxib Nausea Only  . Diphenhydramine Hcl Other (See Comments) and Rash  . Gabapentin Other (See Comments) and Nausea Only    Stomach pain  Stomach pain  . Nsaids Nausea Only  . Pravastatin Rash  . Sumatriptan Rash    Social History:  The patient  reports that she has never smoked. She has never used smokeless tobacco. She reports that she does not drink alcohol or use drugs.   Family History:  The patient's family history includes Heart attack in her father; Heart disease in her brother; Hyperlipidemia in her father; Tuberculosis in her mother.   ROS:  Please see the history of present illness.     All other systems reviewed and negative.   Vital Signs: Blood pressure (!) 158/92, pulse 68, temperature 98 F (36.7 C),  temperature source Oral, height 5\' 3"  (1.6 m), weight 64.4 kg (142 lb), SpO2 100 %.  PHYSICAL EXAM: General:  Well nourished, well developed, in no acute distress HEENT: normal Lymph: no adenopathy Neck: no JVD Endocrine:  No thryomegaly Vascular: No carotid bruits; DP pulses 2+ bilaterally  Cardiac:  normal S1, S2; RRR; no murmur  Lungs:  clear to auscultation bilaterally, no wheezing, rhonchi or rales  Abd: soft, nontender, no hepatomegaly  Ext: no edema Musculoskeletal:  No deformities, BUE and BLE strength normal and equal Skin: warm and dry  Neuro:  CNs 2-12 intact, no focal abnormalities noted Psych:  Normal affect   EKG:  01-25-18 NSR with diffuse TWI, possible anterolateral ischemia  Labs:   Lab Results  Component Value Date   WBC 10.6 (H) 11/25/2016   HGB 8.4 (L) 11/25/2016   HCT 24.8 (L) 11/25/2016   MCV 91.9 11/25/2016  PLT 227 11/25/2016   No results for input(s): NA, K, CL, CO2, BUN, CREATININE, CALCIUM, PROT, BILITOT, ALKPHOS, ALT, AST, GLUCOSE in the last 168 hours.  Invalid input(s): LABALBU No results for input(s): CKTOTAL, CKMB, TROPONINI in the last 72 hours. Troponin (Point of Care Test) No results for input(s): TROPIPOC in the last 72 hours.  No results found for: CHOL, HDL, LDLCALC, TRIG No results found for: DDIMER  Radiology/Studies:  No results found.   TTE 01-25-18 Nl LV sz, EF 50-55%, small area of distal anterior/anteroseptal AK, grade I DD, nl RV, aortic sclerosis w/o stenosis, mild TR  ASSESSMENT AND PLAN:   1. CP/DOE: sx c/w [place]. pt currently pain-free; no h/o elevated troponin. CP occurring however in setting of EKG and TTE abnormalities. Will start heparin IV gtt. LHC is already planned for the AM.   2. HTN: BP a little high; will restart home regimen and adjust meds as needed  3. Dyslipidemia: FLP in the AM. Pt has been reluctant to take statins in the past due to fear of adverse side effects.   Signed,  Precious ReelStephanie Falk Peola Joynt, MD    01/27/2018 6:23 PM

## 2018-01-27 NOTE — Progress Notes (Signed)
ANTICOAGULATION CONSULT NOTE - Initial Consult  Pharmacy Consult for heparin Indication: chest pain/ACS  Allergies  Allergen Reactions  . Aspirin Other (See Comments) and Nausea Only    Stomach pain Stomach pain Stomach pain   . Lisinopril Cough  . Topiramate     Other reaction(s): Other (See Comments) depression  . Alendronate Sodium Nausea And Vomiting  . Celecoxib Nausea Only  . Diphenhydramine Hcl Other (See Comments) and Rash  . Gabapentin Other (See Comments) and Nausea Only    Stomach pain  Stomach pain  . Nsaids Nausea Only  . Pravastatin Rash  . Sumatriptan Rash    Patient Measurements: Height: 5\' 3"  (160 cm) Weight: 142 lb (64.4 kg) IBW/kg (Calculated) : 52.4 Heparin Dosing Weight: 64.4 kg  Vital Signs: Temp: 98 F (36.7 C) (07/14 1730) Temp Source: Oral (07/14 1730) BP: 158/92 (07/14 1730) Pulse Rate: 68 (07/14 1730)  Labs: No results for input(s): HGB, HCT, PLT, APTT, LABPROT, INR, HEPARINUNFRC, HEPRLOWMOCWT, CREATININE, CKTOTAL, CKMB, TROPONINI in the last 72 hours.  CrCl cannot be calculated (Patient's most recent lab result is older than the maximum 21 days allowed.).   Medical History: Past Medical History:  Diagnosis Date  . Anxiety   . Arthritis    "all over" (11/23/2016)  . 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 Bilateral knee pain 10/28/2013  . Bilateral primary osteoarthritis of knee 11/17/2016  . Chronic mid back pain   . Chronic pain 10/18/2015  . Degenerative disc disease, cervical 10/28/2013  . Degenerative disc disease, lumbar 10/28/2013  . Esophageal dysphagia 12/31/2017  . Essential hypertension 10/18/2015  . Exposure to TB    "my mother died when she was [age] of TB"  . GAD (generalized anxiety disorder) 10/18/2015  . GERD (gastroesophageal reflux disease) 12/31/2017  . Headache    HX MIGRAINES  . History of gout    "I've had it occasionally" (11/23/2016)  . Hypertension   . Insomnia 10/18/2015  . Low back pain 10/28/2013  .  Mixed hyperlipidemia 10/19/2017  . Neck pain 10/28/2013  . Osteoarthritis 10/18/2015  . Osteoporosis 10/18/2015  . Other chest pain 12/31/2017  . Pain syndrome, chronic 10/28/2013    Medications:  Scheduled:  . [START ON 01/28/2018] amLODipine  10 mg Oral QAC breakfast  . carvedilol  3.125 mg Oral BID  . famotidine  20 mg Oral BID  . morphine  30 mg Oral Q12H  . PARoxetine  5 mg Oral BID  . Ubiquinol  1 capsule Oral Daily  . VISION FORMULA EYE HEALTH   Oral Daily  . vitamin C  1,000 mg Oral Daily  . Vitamin D3  1,000 Units Oral Daily    Assessment: 71 yof having intermittent CP for 6 months which has worsen over past 3-4 weeks, associated with SOB and diaphoresis. [facility] with EKG showing new ischemic changes and TTE showing RWMA suggestive possible ischemia. Plan for LHC possibly on Monday.   No s/sx of bleeding. Called pharmacy department from [facility] and confirmed last dose of enoxaparin 65 mg was at 0541 this morning. Confirmed no PTA on medication list.   Goal of Therapy:  Heparin level 0.3-0.7 units/ml Monitor platelets by anticoagulation protocol: Yes   Plan:  Start heparin infusion at 750 units/hr Check anti-Xa level in 8 hours and daily while on heparin Continue to monitor H&H and platelets  Girard CooterKimberly Perkins, PharmD Clinical Pharmacist  Pager: [phone] Phone: [phone] 01/27/2018,6:44 PM

## 2018-01-28 DIAGNOSIS — E782 Mixed hyperlipidemia: Secondary | ICD-10-CM | POA: Diagnosis not present

## 2018-01-28 DIAGNOSIS — I200 Unstable angina: Secondary | ICD-10-CM

## 2018-01-28 DIAGNOSIS — I10 Essential (primary) hypertension: Secondary | ICD-10-CM | POA: Diagnosis not present

## 2018-01-28 LAB — BASIC METABOLIC PANEL
Anion gap: 7 (ref 5–15)
BUN: 8 mg/dL (ref 8–23)
CALCIUM: 8.7 mg/dL — AB (ref 8.9–10.3)
CO2: 29 mmol/L (ref 22–32)
Chloride: 106 mmol/L (ref 98–111)
Creatinine, Ser: 0.69 mg/dL (ref 0.44–1.00)
Glucose, Bld: 100 mg/dL — ABNORMAL HIGH (ref 70–99)
Potassium: 4.1 mmol/L (ref 3.5–5.1)
Sodium: 142 mmol/L (ref 135–145)

## 2018-01-28 LAB — LIPID PANEL
CHOLESTEROL: 207 mg/dL — AB (ref 0–200)
HDL: 46 mg/dL (ref >40)
LDL CALC: 119 mg/dL — AB (ref 0–99)
Total CHOL/HDL Ratio: 4.5 RATIO
Triglycerides: 210 mg/dL — ABNORMAL HIGH (ref <150)
VLDL: 42 mg/dL — ABNORMAL HIGH (ref 0–40)

## 2018-01-28 LAB — CBC
HCT: 42.4 % (ref 36.0–46.0)
Hemoglobin: 13.6 g/dL (ref 12.0–15.0)
MCH: 29.7 pg (ref 26.0–34.0)
MCHC: 32.1 g/dL (ref 30.0–36.0)
MCV: 92.6 fL (ref 78.0–100.0)
PLATELETS: 282 10*3/uL (ref 150–400)
RBC: 4.58 MIL/uL (ref 3.87–5.11)
RDW: 12.4 % (ref 11.5–15.5)
WBC: 8.3 10*3/uL (ref 4.0–10.5)

## 2018-01-28 LAB — PROTIME-INR
INR: 0.96
Prothrombin Time: 12.7 seconds (ref 11.4–15.2)

## 2018-01-28 LAB — HEPARIN LEVEL (UNFRACTIONATED)
Heparin Unfractionated: 0.65 IU/mL (ref 0.30–0.70)
Heparin Unfractionated: 0.59 IU/mL (ref 0.30–0.70)

## 2018-01-28 LAB — TROPONIN I

## 2018-01-28 MED ORDER — ASPIRIN 81 MG PO CHEW
81.0000 mg | CHEWABLE_TABLET | ORAL | Status: AC
  Administered 2018-01-29: 81 mg via ORAL
  Filled 2018-01-28: qty 1

## 2018-01-28 MED ORDER — SODIUM CHLORIDE 0.9 % WEIGHT BASED INFUSION: 1.0000 mL/kg/h | INTRAVENOUS | Status: DC

## 2018-01-28 MED ORDER — SODIUM CHLORIDE 0.9 % IV SOLN: 250.0000 mL | INTRAVENOUS | Status: DC | PRN

## 2018-01-28 MED ORDER — SODIUM CHLORIDE 0.9% FLUSH
3.0000 mL | Freq: Two times a day (BID) | INTRAVENOUS | Status: DC
  Administered 2018-01-28: 3 mL via INTRAVENOUS

## 2018-01-28 MED ORDER — SODIUM CHLORIDE 0.9% FLUSH: 3.0000 mL | INTRAVENOUS | Status: DC | PRN

## 2018-01-28 MED ORDER — SODIUM CHLORIDE 0.9 % WEIGHT BASED INFUSION
3.0000 mL/kg/h | INTRAVENOUS | Status: DC
  Administered 2018-01-29: 3 mL/kg/h via INTRAVENOUS

## 2018-01-28 NOTE — Progress Notes (Addendum)
Progress Note  Patient Name: Brandy FinlandJanice Cervantes Date of Encounter: 01/28/2018  Primary Cardiologist: Norman HerrlichBrian Munley, MD   Subjective   Pt states she has had a brief episode of mild chest pain this morning. On heparin drip.  Inpatient Medications    Scheduled Meds: . amLODipine  10 mg Oral QAC breakfast  . carvedilol  3.125 mg Oral BID  . cholecalciferol  1,000 Units Oral Daily  . famotidine  20 mg Oral BID  . morphine  30 mg Oral Q12H  . multivitamin  1 tablet Oral Daily  . PARoxetine  5 mg Oral BID  . vitamin C  1,000 mg Oral Daily   Continuous Infusions: . heparin 750 Units/hr (01/28/18 0636)   PRN Meds: acetaminophen, cyclobenzaprine, fluticasone, HYDROcodone-acetaminophen, nabumetone, nitroGLYCERIN, ondansetron (ZOFRAN) IV, zolpidem   Vital Signs    Vitals:   01/27/18 1730 01/27/18 2046 01/28/18 0401 01/28/18 0624  BP: (!) 158/92 132/83 128/70 (!) 151/90  Pulse: 68 73 82   Resp:  18 18 13   Temp: 98 F (36.7 C) 98.7 F (37.1 C) 98.6 F (37 C)   TempSrc: Oral Oral Oral   SpO2: 100% 97% 99%   Weight: 142 lb (64.4 kg)  141 lb 15.6 oz (64.4 kg)   Height: 5\' 3"  (1.6 m)       Intake/Output Summary (Last 24 hours) at 01/28/2018 0737 Last data filed at 01/28/2018 0636 Gross per 24 hour  Intake 79.54 ml  Output 1 ml  Net 78.54 ml   Filed Weights   01/27/18 1730 01/28/18 0401  Weight: 142 lb (64.4 kg) 141 lb 15.6 oz (64.4 kg)    Telemetry    sinus - Personally Reviewed  ECG    Sinus, TWI throughout precordial leads - Personally Reviewed  Physical Exam   GEN: No acute distress.   Neck: No JVD Cardiac: RRR, no murmurs, rubs, or gallops.  Respiratory: Clear to auscultation bilaterally. GI: Soft, nontender, non-distended  MS: No edema; No deformity. Neuro:  Nonfocal  Psych: Normal affect   Labs    Chemistry Recent Labs  Lab 01/27/18 2029 01/28/18 0326  NA 143 142  K 4.2 4.1  CL 106 106  CO2 26 29  GLUCOSE 123* 100*  BUN 7* 8  CREATININE 0.66  0.69  CALCIUM 9.0 8.7*  PROT 6.7  --   ALBUMIN 3.5  --   AST 34  --   ALT 29  --   ALKPHOS 100  --   BILITOT 0.5  --   GFRNONAA >60 >60  GFRAA >60 >60  ANIONGAP 11 7     Hematology Recent Labs  Lab 01/28/18 0326  WBC 8.3  RBC 4.58  HGB 13.6  HCT 42.4  MCV 92.6  MCH 29.7  MCHC 32.1  RDW 12.4  PLT 282    Cardiac Enzymes Recent Labs  Lab 01/27/18 2029 01/28/18 0326  TROPONINI <0.03 <0.03   No results for input(s): TROPIPOC in the last 168 hours.   BNPNo results for input(s): BNP, PROBNP in the last 168 hours.   DDimer No results for input(s): DDIMER in the last 168 hours.   Radiology    No results found.  Cardiac Studies   [facility] - awaiting [facility], report to be faxed to cath lab  Patient Profile     72 y.o. female w/ h/o HTN, dyslipidemia but no prior h/o CAD transferred from OSH for chest discomfort.  Assessment & Plan    1. Chest pain -  troponin x 2 negative - EKG with diffuse TWI across precordial leads - pt was started on heparin drip - ASA listed as an allergy for GI upset - given her risk factors of HTN and HLD, family history of hear disease in her father and brother, in addition to TWI, will schedule for heart cath today The patient understands that risks included but are not limited to stroke (1 in 1000), death (1 in 1000), kidney failure [usually temporary] (1 in 500), bleeding (1 in 200), allergic reaction [possibly serious] (1 in 200).  The patient understands and agrees to proceed.    2. HTN - pressures are elevated prior to morning medications - medications: norvasc, coreg   3. HLD - pt reluctant to take statins for side effects - 01/28/2018: Cholesterol 207; HDL 46; LDL Cholesterol 119; Triglycerides 210; VLDL 42 - will need statin, can start low 20 mg crestor    For questions or updates, please contact [facility] Please consult [email or web address] for contact info under Cardiology/STEMI.      Signed, Roe Rutherford  [facility], PA  01/28/2018, 7:37 AM    I have examined the patient and reviewed assessment and plan and discussed with patient.  Agree with above as stated.  All questions regarding cath answered.  TWI noted on echo.  Troponin negative.  Plan for diagnostic cath today with possible PCI.  Intolerant to aspirin.  Does not appear to be a true allergy.  Lance Muss

## 2018-01-28 NOTE — Progress Notes (Signed)
ANTICOAGULATION CONSULT NOTE - Follow Up Consult  Pharmacy Consult for heparin Indication: chest pain/ACS  Labs: Recent Labs    01/27/18 2029 01/28/18 0326  HGB  --  13.6  HCT  --  42.4  PLT  --  282  LABPROT  --  12.7  INR  --  0.96  HEPARINUNFRC  --  0.65  CREATININE 0.66  --   TROPONINI <0.03  --     Assessment/Plan:  72yo female therapeutic on heparin with initial dosing for CP. Will continue gtt at current rate and confirm stable with additional level.   Vernard GamblesVeronda Derek Huneycutt, PharmD, BCPS  01/28/2018,4:28 AM

## 2018-01-28 NOTE — Progress Notes (Signed)
ANTICOAGULATION CONSULT NOTE  Pharmacy Consult for heparin Indication: chest pain/ACS  Allergies  Allergen Reactions  . Aspirin Other (See Comments) and Nausea Only    Stomach pain Stomach pain Stomach pain   . Lisinopril Cough  . Topiramate     Other reaction(s): Other (See Comments) depression  . Alendronate Sodium Nausea And Vomiting  . Celecoxib Nausea Only  . Diphenhydramine Hcl Other (See Comments) and Rash  . Gabapentin Other (See Comments) and Nausea Only    Stomach pain  Stomach pain  . Nsaids Nausea Only  . Pravastatin Rash  . Sumatriptan Rash    Patient Measurements: Height: 5\' 3"  (160 cm) Weight: 141 lb 15.6 oz (64.4 kg) IBW/kg (Calculated) : 52.4 Heparin Dosing Weight: 64.4 kg  Vital Signs: Temp: 98.6 F (37 C) (07/15 0401) Temp Source: Oral (07/15 0401) BP: 151/90 (07/15 0624) Pulse Rate: 82 (07/15 0401)  Labs: Recent Labs    01/27/18 2029 01/28/18 0326 01/28/18 0858  HGB  --  13.6  --   HCT  --  42.4  --   PLT  --  282  --   LABPROT  --  12.7  --   INR  --  0.96  --   HEPARINUNFRC  --  0.65 0.59  CREATININE 0.66 0.69  --   TROPONINI <0.03 <0.03  --     Estimated Creatinine Clearance: 58.2 mL/min (by C-G formula based on SCr of 0.69 mg/dL).   Medical History: Past Medical History:  Diagnosis Date  . Anxiety   . Arthritis    "all over" (11/23/2016)  . 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 Bilateral knee pain 10/28/2013  . Bilateral primary osteoarthritis of knee 11/17/2016  . Chronic mid back pain   . Chronic pain 10/18/2015  . Degenerative disc disease, cervical 10/28/2013  . Degenerative disc disease, lumbar 10/28/2013  . Esophageal dysphagia 12/31/2017  . Essential hypertension 10/18/2015  . Exposure to TB    "my mother died when she was [age] of TB"  . GAD (generalized anxiety disorder) 10/18/2015  . GERD (gastroesophageal reflux disease) 12/31/2017  . Headache    HX MIGRAINES  . History of gout    "I've had it occasionally"  (11/23/2016)  . Hypertension   . Insomnia 10/18/2015  . Low back pain 10/28/2013  . Mixed hyperlipidemia 10/19/2017  . Neck pain 10/28/2013  . Osteoarthritis 10/18/2015  . Osteoporosis 10/18/2015  . Other chest pain 12/31/2017  . Pain syndrome, chronic 10/28/2013    Medications:  Scheduled:  . amLODipine  10 mg Oral QAC breakfast  . carvedilol  3.125 mg Oral BID  . cholecalciferol  1,000 Units Oral Daily  . famotidine  20 mg Oral BID  . morphine  30 mg Oral Q12H  . multivitamin  1 tablet Oral Daily  . PARoxetine  5 mg Oral BID  . vitamin C  1,000 mg Oral Daily    Assessment: 71 yof having intermittent CP for 6 months, which has worsened over past 3-4 weeks, associated with SOB and diaphoresis. [facility] with EKG showing new ischemic changes and TTE showing RWMA suggestive possible ischemia. [facility]   Heparin level remains therapeutic. CBC wnl. No s/sx of bleeding documented. No PTA anticoagulation. [facility], last dose 7/14 AM.  Goal of Therapy:  Heparin level 0.3-0.7 units/ml Monitor platelets by anticoagulation protocol: Yes   Plan:  Continue heparin infusion at 750 units/hr Monitor daily heparin level and CBC, s/sx bleeding  F/u plans post-cath 7/15  Babs Bertin, PharmD, BCPS Clinical Pharmacist Clinical phone [phone] Please check AMION for all [facility] contact numbers 01/28/2018 9:59 AM

## 2018-01-29 ENCOUNTER — Inpatient Hospital Stay (HOSPITAL_COMMUNITY)
Admission: AD | Disposition: A | Discharge status: Home / Self Care | Payer: Self-pay | Source: Other Acute Inpatient Hospital | Attending: Internal Medicine

## 2018-01-29 ENCOUNTER — Encounter (HOSPITAL_COMMUNITY): Payer: Self-pay | Admitting: Cardiovascular Disease

## 2018-01-29 ENCOUNTER — Other Ambulatory Visit: Payer: Self-pay

## 2018-01-29 DIAGNOSIS — G4700 Insomnia, unspecified: Secondary | ICD-10-CM | POA: Diagnosis present

## 2018-01-29 DIAGNOSIS — M5030 Other cervical disc degeneration, unspecified cervical region: Secondary | ICD-10-CM | POA: Diagnosis present

## 2018-01-29 DIAGNOSIS — Z96653 Presence of artificial knee joint, bilateral: Secondary | ICD-10-CM | POA: Diagnosis present

## 2018-01-29 DIAGNOSIS — E782 Mixed hyperlipidemia: Secondary | ICD-10-CM | POA: Diagnosis present

## 2018-01-29 DIAGNOSIS — Z888 Allergy status to other drugs, medicaments and biological substances status: Secondary | ICD-10-CM | POA: Diagnosis not present

## 2018-01-29 DIAGNOSIS — M5136 Other intervertebral disc degeneration, lumbar region: Secondary | ICD-10-CM | POA: Diagnosis present

## 2018-01-29 DIAGNOSIS — I10 Essential (primary) hypertension: Secondary | ICD-10-CM | POA: Diagnosis present

## 2018-01-29 DIAGNOSIS — M810 Age-related osteoporosis without current pathological fracture: Secondary | ICD-10-CM | POA: Diagnosis present

## 2018-01-29 DIAGNOSIS — Z831 Family history of other infectious and parasitic diseases: Secondary | ICD-10-CM | POA: Diagnosis not present

## 2018-01-29 DIAGNOSIS — F411 Generalized anxiety disorder: Secondary | ICD-10-CM | POA: Diagnosis present

## 2018-01-29 DIAGNOSIS — I7300 Raynaud's syndrome without gangrene: Secondary | ICD-10-CM | POA: Diagnosis present

## 2018-01-29 DIAGNOSIS — I200 Unstable angina: Secondary | ICD-10-CM | POA: Diagnosis present

## 2018-01-29 DIAGNOSIS — Z201 Contact with and (suspected) exposure to tuberculosis: Secondary | ICD-10-CM | POA: Diagnosis present

## 2018-01-29 DIAGNOSIS — K219 Gastro-esophageal reflux disease without esophagitis: Secondary | ICD-10-CM | POA: Diagnosis present

## 2018-01-29 DIAGNOSIS — I25110 Atherosclerotic heart disease of native coronary artery with unstable angina pectoris: Principal | ICD-10-CM

## 2018-01-29 DIAGNOSIS — Z886 Allergy status to analgesic agent status: Secondary | ICD-10-CM | POA: Diagnosis not present

## 2018-01-29 DIAGNOSIS — Z79899 Other long term (current) drug therapy: Secondary | ICD-10-CM | POA: Diagnosis not present

## 2018-01-29 DIAGNOSIS — Z79891 Long term (current) use of opiate analgesic: Secondary | ICD-10-CM | POA: Diagnosis not present

## 2018-01-29 DIAGNOSIS — Z8249 Family history of ischemic heart disease and other diseases of the circulatory system: Secondary | ICD-10-CM | POA: Diagnosis not present

## 2018-01-29 DIAGNOSIS — Z8349 Family history of other endocrine, nutritional and metabolic diseases: Secondary | ICD-10-CM | POA: Diagnosis not present

## 2018-01-29 HISTORY — PX: CORONARY STENT INTERVENTION: CATH118234

## 2018-01-29 HISTORY — PX: LEFT HEART CATH AND CORONARY ANGIOGRAPHY: CATH118249

## 2018-01-29 LAB — BASIC METABOLIC PANEL
ANION GAP: 8 (ref 5–15)
BUN: 10 mg/dL (ref 8–23)
CALCIUM: 8.8 mg/dL — AB (ref 8.9–10.3)
CO2: 26 mmol/L (ref 22–32)
CREATININE: 0.62 mg/dL (ref 0.44–1.00)
Chloride: 108 mmol/L (ref 98–111)
GFR calc Af Amer: >60 mL/min (ref >60)
GLUCOSE: 102 mg/dL — AB (ref 70–99)
Potassium: 4 mmol/L (ref 3.5–5.1)
Sodium: 142 mmol/L (ref 135–145)

## 2018-01-29 LAB — CBC
HCT: 44.2 % (ref 36.0–46.0)
Hemoglobin: 14.3 g/dL (ref 12.0–15.0)
MCH: 29.8 pg (ref 26.0–34.0)
MCHC: 32.4 g/dL (ref 30.0–36.0)
MCV: 92.1 fL (ref 78.0–100.0)
PLATELETS: 290 10*3/uL (ref 150–400)
RBC: 4.8 MIL/uL (ref 3.87–5.11)
RDW: 12.6 % (ref 11.5–15.5)
WBC: 7.1 10*3/uL (ref 4.0–10.5)

## 2018-01-29 LAB — POCT ACTIVATED CLOTTING TIME: ACTIVATED CLOTTING TIME: 439 s

## 2018-01-29 LAB — HEPARIN LEVEL (UNFRACTIONATED): HEPARIN UNFRACTIONATED: 0.43 [IU]/mL (ref 0.30–0.70)

## 2018-01-29 SURGERY — LEFT HEART CATH AND CORONARY ANGIOGRAPHY
Anesthesia: LOCAL

## 2018-01-29 MED ORDER — MIDAZOLAM HCL 2 MG/2ML IJ SOLN
INTRAMUSCULAR | Status: AC
  Filled 2018-01-29: qty 2

## 2018-01-29 MED ORDER — LIDOCAINE HCL (PF) 1 % IJ SOLN
INTRAMUSCULAR | Status: AC
  Filled 2018-01-29: qty 30

## 2018-01-29 MED ORDER — DIAZEPAM 5 MG PO TABS
5.0000 mg | ORAL_TABLET | Freq: Four times a day (QID) | ORAL | Status: DC | PRN
Start: 2018-01-29 — End: 2018-01-30
  Administered 2018-01-29: 5 mg via ORAL
  Filled 2018-01-29: qty 1

## 2018-01-29 MED ORDER — SODIUM CHLORIDE 0.9 % IV SOLN: 250.0000 mL | INTRAVENOUS | Status: DC | PRN

## 2018-01-29 MED ORDER — IOPAMIDOL (ISOVUE-370) INJECTION 76%
INTRAVENOUS | Status: AC
  Filled 2018-01-29: qty 100

## 2018-01-29 MED ORDER — VERAPAMIL HCL 2.5 MG/ML IV SOLN
INTRAVENOUS | Status: AC
  Filled 2018-01-29: qty 2

## 2018-01-29 MED ORDER — ONDANSETRON HCL 4 MG/2ML IJ SOLN: 4.0000 mg | Freq: Four times a day (QID) | INTRAMUSCULAR | Status: DC | PRN

## 2018-01-29 MED ORDER — SODIUM CHLORIDE 0.9 % IV SOLN
INTRAVENOUS | Status: DC
  Administered 2018-01-29 (×2): via INTRAVENOUS

## 2018-01-29 MED ORDER — HEPARIN (PORCINE) IN NACL 1000-0.9 UT/500ML-% IV SOLN
INTRAVENOUS | Status: AC
  Filled 2018-01-29: qty 500

## 2018-01-29 MED ORDER — HEPARIN SODIUM (PORCINE) 1000 UNIT/ML IJ SOLN
INTRAMUSCULAR | Status: AC
  Filled 2018-01-29: qty 1

## 2018-01-29 MED ORDER — HYDRALAZINE HCL 20 MG/ML IJ SOLN
5.0000 mg | INTRAMUSCULAR | Status: AC | PRN
  Administered 2018-01-29: 5 mg via INTRAVENOUS
  Filled 2018-01-29: qty 1

## 2018-01-29 MED ORDER — ASPIRIN 81 MG PO CHEW: 81.0000 mg | CHEWABLE_TABLET | Freq: Every day | ORAL | Status: DC

## 2018-01-29 MED ORDER — SODIUM CHLORIDE 0.9 % IV SOLN
INTRAVENOUS | Status: AC | PRN
  Administered 2018-01-29: 1.75 mg/kg/h via INTRAVENOUS

## 2018-01-29 MED ORDER — SODIUM CHLORIDE 0.9% FLUSH
3.0000 mL | Freq: Two times a day (BID) | INTRAVENOUS | Status: DC
  Administered 2018-01-29: 3 mL via INTRAVENOUS

## 2018-01-29 MED ORDER — TICAGRELOR 90 MG PO TABS
90.0000 mg | ORAL_TABLET | Freq: Two times a day (BID) | ORAL | Status: DC
  Administered 2018-01-30 (×2): 90 mg via ORAL
  Filled 2018-01-29 (×2): qty 1

## 2018-01-29 MED ORDER — MIDAZOLAM HCL 2 MG/2ML IJ SOLN
INTRAMUSCULAR | Status: DC | PRN
  Administered 2018-01-29: 2 mg via INTRAVENOUS
  Administered 2018-01-29: 1 mg via INTRAVENOUS

## 2018-01-29 MED ORDER — NITROGLYCERIN 1 MG/10 ML FOR IR/CATH LAB
INTRA_ARTERIAL | Status: DC | PRN
  Administered 2018-01-29 (×3): 200 ug via INTRACORONARY

## 2018-01-29 MED ORDER — NITROGLYCERIN 1 MG/10 ML FOR IR/CATH LAB
INTRA_ARTERIAL | Status: AC
  Filled 2018-01-29: qty 10

## 2018-01-29 MED ORDER — SODIUM CHLORIDE 0.9% FLUSH: 3.0000 mL | INTRAVENOUS | Status: DC | PRN

## 2018-01-29 MED ORDER — FENTANYL CITRATE (PF) 100 MCG/2ML IJ SOLN
INTRAMUSCULAR | Status: AC
  Filled 2018-01-29: qty 2

## 2018-01-29 MED ORDER — HEPARIN (PORCINE) IN NACL 1000-0.9 UT/500ML-% IV SOLN
INTRAVENOUS | Status: DC | PRN
  Administered 2018-01-29: 500 mL

## 2018-01-29 MED ORDER — FENTANYL CITRATE (PF) 100 MCG/2ML IJ SOLN
INTRAMUSCULAR | Status: DC | PRN
  Administered 2018-01-29 (×2): 25 ug via INTRAVENOUS

## 2018-01-29 MED ORDER — LABETALOL HCL 5 MG/ML IV SOLN
10.0000 mg | INTRAVENOUS | Status: AC | PRN
  Administered 2018-01-29: 17:00:00 10 mg via INTRAVENOUS
  Filled 2018-01-29: qty 4

## 2018-01-29 MED ORDER — BIVALIRUDIN BOLUS VIA INFUSION - CUPID
INTRAVENOUS | Status: DC | PRN
  Administered 2018-01-29: 48.3 mg via INTRAVENOUS

## 2018-01-29 MED ORDER — LIDOCAINE HCL (PF) 1 % IJ SOLN
INTRAMUSCULAR | Status: DC | PRN
  Administered 2018-01-29: 2 mL
  Administered 2018-01-29: 15 mL

## 2018-01-29 MED ORDER — TICAGRELOR 90 MG PO TABS
ORAL_TABLET | ORAL | Status: AC
  Filled 2018-01-29: qty 2

## 2018-01-29 MED ORDER — BIVALIRUDIN TRIFLUOROACETATE 250 MG IV SOLR
INTRAVENOUS | Status: AC
  Filled 2018-01-29: qty 250

## 2018-01-29 MED ORDER — IOPAMIDOL (ISOVUE-370) INJECTION 76%
INTRAVENOUS | Status: DC | PRN
  Administered 2018-01-29: 165 mL via INTRA_ARTERIAL

## 2018-01-29 MED ORDER — TICAGRELOR 90 MG PO TABS
ORAL_TABLET | ORAL | Status: DC | PRN
  Administered 2018-01-29: 180 mg via ORAL

## 2018-01-29 MED ORDER — ATORVASTATIN CALCIUM 40 MG PO TABS
40.0000 mg | ORAL_TABLET | Freq: Every day | ORAL | Status: DC
  Administered 2018-01-29: 40 mg via ORAL
  Filled 2018-01-29: qty 1

## 2018-01-29 MED ORDER — ANGIOPLASTY BOOK
Freq: Once | Status: AC
  Administered 2018-01-29: 22:00:00
  Filled 2018-01-29: qty 1

## 2018-01-29 MED ORDER — ACETAMINOPHEN 325 MG PO TABS: 650.0000 mg | ORAL_TABLET | ORAL | Status: DC | PRN

## 2018-01-29 SURGICAL SUPPLY — 21 items
BALLN SAPPHIRE 2.0X12 (BALLOONS) ×2
BALLN SAPPHIRE ~~LOC~~ 2.75X12 (BALLOONS) ×1 IMPLANT
BALLOON SAPPHIRE 2.0X12 (BALLOONS) IMPLANT
CATH INFINITI 5FR MULTPACK ANG (CATHETERS) ×1 IMPLANT
CATH VISTA GUIDE 6FR XBLAD3.5 (CATHETERS) ×1 IMPLANT
COVER PRB 48X5XTLSCP FOLD TPE (BAG) IMPLANT
COVER PROBE 5X48 (BAG) ×2
GLIDESHEATH SLEND SS 6F .021 (SHEATH) ×1 IMPLANT
GUIDEWIRE INQWIRE 1.5J.035X260 (WIRE) IMPLANT
INQWIRE 1.5J .035X260CM (WIRE) ×2
KIT ENCORE 26 ADVANTAGE (KITS) ×1 IMPLANT
KIT HEART LEFT (KITS) ×2 IMPLANT
PACK CARDIAC CATHETERIZATION (CUSTOM PROCEDURE TRAY) ×2 IMPLANT
SHEATH PINNACLE 5F 10CM (SHEATH) ×1 IMPLANT
SHEATH PINNACLE 6F 10CM (SHEATH) ×1 IMPLANT
STENT RESOLUTE ONYX 2.5X15 (Permanent Stent) ×1 IMPLANT
SYR MEDRAD MARK V 150ML (SYRINGE) ×2 IMPLANT
TRANSDUCER W/STOPCOCK (MISCELLANEOUS) ×2 IMPLANT
TUBING CIL FLEX 10 FLL-RA (TUBING) ×2 IMPLANT
WIRE ASAHI PROWATER 180CM (WIRE) ×1 IMPLANT
WIRE EMERALD 3MM-J .035X150CM (WIRE) ×1 IMPLANT

## 2018-01-29 NOTE — Progress Notes (Signed)
Progress Note  Patient Name: Brandy FinlandJanice Cervantes Date of Encounter: 01/29/2018  Primary Cardiologist: Norman HerrlichBrian Munley, MD   Subjective   Pt denies chest pain. Heparin running. Awaiting cath today  Inpatient Medications    Scheduled Meds: . amLODipine  10 mg Oral QAC breakfast  . carvedilol  3.125 mg Oral BID  . cholecalciferol  1,000 Units Oral Daily  . famotidine  20 mg Oral BID  . morphine  30 mg Oral Q12H  . multivitamin  1 tablet Oral Daily  . PARoxetine  5 mg Oral BID  . sodium chloride flush  3 mL Intravenous Q12H  . vitamin C  1,000 mg Oral Daily   Continuous Infusions: . sodium chloride    . sodium chloride 1 mL/kg/hr (01/29/18 16100642)  . heparin 750 Units/hr (01/29/18 96040623)   PRN Meds: sodium chloride, acetaminophen, cyclobenzaprine, fluticasone, HYDROcodone-acetaminophen, nabumetone, nitroGLYCERIN, ondansetron (ZOFRAN) IV, sodium chloride flush, zolpidem   Vital Signs    Vitals:   01/28/18 1920 01/28/18 2059 01/29/18 0327 01/29/18 0828  BP: 99/75 112/75 126/83 125/76  Pulse:  75 73   Resp: (!) 21 (!) 21 14   Temp:  98 F (36.7 C) 97.8 F (36.6 C)   TempSrc:  Oral Oral   SpO2:  96% 97%   Weight:   141 lb 14.4 oz (64.4 kg)   Height:        Intake/Output Summary (Last 24 hours) at 01/29/2018 1117 Last data filed at 01/29/2018 [id number] Gross per 24 hour  Intake 273.89 ml  Output -  Net 273.89 ml   Filed Weights   01/27/18 1730 01/28/18 0401 01/29/18 0327  Weight: 142 lb (64.4 kg) 141 lb 15.6 oz (64.4 kg) 141 lb 14.4 oz (64.4 kg)    Telemetry    sinus - Personally Reviewed  ECG    TWI precordial leads - Personally Reviewed  Physical Exam   GEN: No acute distress.   Neck: No JVD Cardiac: RRR, no murmurs, rubs, or gallops.  Respiratory: Clear to auscultation bilaterally. GI: Soft, nontender, non-distended  MS: No edema; No deformity. Neuro:  Nonfocal  Psych: Normal affect   Labs    Chemistry Recent Labs  Lab 01/27/18 2029 01/28/18 0326  01/29/18 0618  NA 143 142 142  K 4.2 4.1 4.0  CL 106 106 108  CO2 26 29 26   GLUCOSE 123* 100* 102*  BUN 7* 8 10  CREATININE 0.66 0.69 0.62  CALCIUM 9.0 8.7* 8.8*  PROT 6.7  --   --   ALBUMIN 3.5  --   --   AST 34  --   --   ALT 29  --   --   ALKPHOS 100  --   --   BILITOT 0.5  --   --   GFRNONAA >60 >60 >60  GFRAA >60 >60 >60  ANIONGAP 11 7 8      Hematology Recent Labs  Lab 01/28/18 0326 01/29/18 0618  WBC 8.3 7.1  RBC 4.58 4.80  HGB 13.6 14.3  HCT 42.4 44.2  MCV 92.6 92.1  MCH 29.7 29.8  MCHC 32.1 32.4  RDW 12.4 12.6  PLT 282 290    Cardiac Enzymes Recent Labs  Lab 01/27/18 2029 01/28/18 0326 01/28/18 0858  TROPONINI <0.03 <0.03 <0.03   No results for input(s): TROPIPOC in the last 168 hours.   BNPNo results for input(s): BNP, PROBNP in the last 168 hours.   DDimer No results for input(s): DDIMER in the last 168 hours.  Radiology    No results found.  Cardiac Studies   Left heart cath pending  Patient Profile     72 y.o. female w/ h/o HTN, dyslipidemia but no prior h/o CAD transferred from OSH for chest discomfort. Awaiting cath today.  Assessment & Plan    1. Unstable angina - pt denies chest pain overnight and today - heparin running - continue ASA (allergy listed not a true allergy) - mild headache - awaiting cath today - EKG continues to have TWI in precordial leads - echo not yet ordered   2. HTN - continue norvasc, coreg - pressures well-controlled   3. HLD - pt reluctant to take statins for side effects - 01/28/2018: Cholesterol 207; HDL 46; LDL Cholesterol 119; Triglycerides 210; VLDL 42 - will need statin, can start low 20 mg crestor    For questions or updates, please contact [facility] Please consult [email or web address] for contact info under Cardiology/STEMI.      Signed, Roe Rutherford Josian Lanese, PA  01/29/2018, 11:17 AM

## 2018-01-29 NOTE — Progress Notes (Signed)
Site area: right groin  Site Prior to Removal:  Level 0  Pressure Applied For 20 MINUTES    Minutes Beginning at 1700  Manual:   Yes.    Patient Status During Pull:  stable  Post Pull Groin Site:  Level 0  Post Pull Instructions Given:  Yes.    Post Pull Pulses Present:  Yes.    Dressing Applied:  Yes.    Comments:   

## 2018-01-29 NOTE — Progress Notes (Signed)
Per rep at [employer];   Brand $125 for 90 day mail order, $7.47 for 30 day retail  No auth required, no deductible   Patient can use [facility] or [facility]

## 2018-01-29 NOTE — Research (Deleted)
CADFEM Informed Consent   Subject Name: Brandy Cervantes  Subject met inclusion and exclusion criteria.  The informed consent form, study requirements and expectations were reviewed with the subject and questions and concerns were addressed prior to the signing of the consent form.  The subject verbalized understanding of the trail requirements.  The subject agreed to participate in the CADFEM trial and signed the informed consent.  The informed consent was obtained prior to performance of any protocol-specific procedures for the subject.  A copy of the signed informed consent was given to the subject and a copy was placed in the subject's medical record.  [name] 01/29/2018, 9:40 AM

## 2018-01-29 NOTE — Progress Notes (Signed)
ANTICOAGULATION CONSULT NOTE  Pharmacy Consult for heparin Indication: chest pain/ACS  Allergies  Allergen Reactions  . Aspirin Other (See Comments) and Nausea Only    Stomach pain Stomach pain Stomach pain   . Lisinopril Cough  . Topiramate     Other reaction(s): Other (See Comments) depression  . Alendronate Sodium Nausea And Vomiting  . Celecoxib Nausea Only  . Diphenhydramine Hcl Other (See Comments) and Rash  . Gabapentin Other (See Comments) and Nausea Only    Stomach pain  Stomach pain  . Nsaids Nausea Only  . Pravastatin Rash  . Sumatriptan Rash    Patient Measurements: Height: 5\' 3"  (160 cm) Weight: 141 lb 14.4 oz (64.4 kg) IBW/kg (Calculated) : 52.4 Heparin Dosing Weight: 64.4 kg  Vital Signs: Temp: 97.8 F (36.6 C) (07/16 0327) Temp Source: Oral (07/16 0327) BP: 125/76 (07/16 0828) Pulse Rate: 73 (07/16 0327)  Labs: Recent Labs    01/27/18 2029 01/28/18 0326 01/28/18 0858 01/29/18 0618  HGB  --  13.6  --  14.3  HCT  --  42.4  --  44.2  PLT  --  282  --  290  LABPROT  --  12.7  --   --   INR  --  0.96  --   --   HEPARINUNFRC  --  0.65 0.59 0.43  CREATININE 0.66 0.69  --  0.62  TROPONINI <0.03 <0.03 <0.03  --     Estimated Creatinine Clearance: 58.2 mL/min (by C-G formula based on SCr of 0.62 mg/dL).   Medical History: Past Medical History:  Diagnosis Date  . Anxiety   . Arthritis    "all over" (11/23/2016)  . 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 Bilateral knee pain 10/28/2013  . Bilateral primary osteoarthritis of knee 11/17/2016  . Chronic mid back pain   . Chronic pain 10/18/2015  . Degenerative disc disease, cervical 10/28/2013  . Degenerative disc disease, lumbar 10/28/2013  . Esophageal dysphagia 12/31/2017  . Essential hypertension 10/18/2015  . Exposure to TB    "my mother died when she was [age] of TB"  . GAD (generalized anxiety disorder) 10/18/2015  . GERD (gastroesophageal reflux disease) 12/31/2017  . Headache    HX  MIGRAINES  . History of gout    "I've had it occasionally" (11/23/2016)  . Hypertension   . Insomnia 10/18/2015  . Low back pain 10/28/2013  . Mixed hyperlipidemia 10/19/2017  . Neck pain 10/28/2013  . Osteoarthritis 10/18/2015  . Osteoporosis 10/18/2015  . Other chest pain 12/31/2017  . Pain syndrome, chronic 10/28/2013    Medications:  Scheduled:  . amLODipine  10 mg Oral QAC breakfast  . carvedilol  3.125 mg Oral BID  . cholecalciferol  1,000 Units Oral Daily  . famotidine  20 mg Oral BID  . morphine  30 mg Oral Q12H  . multivitamin  1 tablet Oral Daily  . PARoxetine  5 mg Oral BID  . sodium chloride flush  3 mL Intravenous Q12H  . vitamin C  1,000 mg Oral Daily    Assessment: 71 yof having intermittent CP for 6 months, which has worsened over past 3-4 weeks, associated with SOB and diaphoresis. Transferred [facility] with EKG showing new ischemic changes and TTE showing RWMA suggestive possible ischemia.   Heparin level remains therapeutic. CBC wnl. No s/sx of bleeding documented. No PTA anticoagulation. Plan for cath today at 1200 PM.   Goal of Therapy:  Heparin level 0.3-0.7 units/ml Monitor platelets by anticoagulation protocol:  Yes   Plan:  Continue heparin infusion at 750 units/hr Monitor daily heparin level and CBC, s/sx bleeding F/u plans post-cath 7/16  Link Snuffer, PharmD, BCPS, BCCCP Clinical Pharmacist Clinical phone 01/29/2018 until 3:30PM - [phone] After hours, please call [phone] Please check AMION for all [facility] Pharmacy contact numbers 01/29/2018 11:19 AM

## 2018-01-30 ENCOUNTER — Encounter (HOSPITAL_COMMUNITY): Payer: Self-pay | Admitting: Physician Assistant

## 2018-01-30 DIAGNOSIS — I7300 Raynaud's syndrome without gangrene: Secondary | ICD-10-CM | POA: Diagnosis present

## 2018-01-30 LAB — BASIC METABOLIC PANEL
ANION GAP: 8 (ref 5–15)
BUN: 10 mg/dL (ref 8–23)
CO2: 23 mmol/L (ref 22–32)
Calcium: 8.6 mg/dL — ABNORMAL LOW (ref 8.9–10.3)
Chloride: 110 mmol/L (ref 98–111)
Creatinine, Ser: 0.73 mg/dL (ref 0.44–1.00)
GFR calc non Af Amer: >60 mL/min (ref >60)
Glucose, Bld: 132 mg/dL — ABNORMAL HIGH (ref 70–99)
Potassium: 3.5 mmol/L (ref 3.5–5.1)
Sodium: 141 mmol/L (ref 135–145)

## 2018-01-30 LAB — CBC
HEMATOCRIT: 43.9 % (ref 36.0–46.0)
HEMOGLOBIN: 14.2 g/dL (ref 12.0–15.0)
MCH: 29.8 pg (ref 26.0–34.0)
MCHC: 32.3 g/dL (ref 30.0–36.0)
MCV: 92.2 fL (ref 78.0–100.0)
PLATELETS: 294 10*3/uL (ref 150–400)
RBC: 4.76 MIL/uL (ref 3.87–5.11)
RDW: 12.6 % (ref 11.5–15.5)
WBC: 8.6 10*3/uL (ref 4.0–10.5)

## 2018-01-30 LAB — HEPATIC FUNCTION PANEL
ALBUMIN: 3.2 g/dL — AB (ref 3.5–5.0)
ALK PHOS: 94 U/L (ref 38–126)
ALT: 53 U/L — ABNORMAL HIGH (ref 0–44)
AST: 60 U/L — AB (ref 15–41)
BILIRUBIN TOTAL: 0.4 mg/dL (ref 0.3–1.2)
Bilirubin, Direct: 0.1 mg/dL (ref 0.0–0.2)
Indirect Bilirubin: 0.3 mg/dL (ref 0.3–0.9)
TOTAL PROTEIN: 6.3 g/dL — AB (ref 6.5–8.1)

## 2018-01-30 MED ORDER — NITROGLYCERIN 0.4 MG SL SUBL: 0.4000 mg | SUBLINGUAL_TABLET | SUBLINGUAL | 2 refills | Status: DC | PRN

## 2018-01-30 MED ORDER — ROSUVASTATIN CALCIUM 20 MG PO TABS: 20.0000 mg | ORAL_TABLET | Freq: Every day | ORAL | 3 refills | Status: DC

## 2018-01-30 MED ORDER — PANTOPRAZOLE SODIUM 40 MG PO TBEC: 40.0000 mg | DELAYED_RELEASE_TABLET | Freq: Every day | ORAL | 3 refills | Status: DC

## 2018-01-30 MED ORDER — TICAGRELOR 90 MG PO TABS: 90.0000 mg | ORAL_TABLET | Freq: Two times a day (BID) | ORAL | 3 refills | Status: DC

## 2018-01-30 MED ORDER — PANTOPRAZOLE SODIUM 40 MG PO TBEC: 40.0000 mg | DELAYED_RELEASE_TABLET | Freq: Every day | ORAL | Status: DC

## 2018-01-30 MED ORDER — TICAGRELOR 90 MG PO TABS: 90.0000 mg | ORAL_TABLET | Freq: Two times a day (BID) | ORAL | 0 refills | Status: DC

## 2018-01-30 MED FILL — Verapamil HCl IV Soln 2.5 MG/ML: INTRAVENOUS | Qty: 2 | Status: AC

## 2018-01-30 MED FILL — Heparin Sodium (Porcine) Inj 1000 Unit/ML: INTRAMUSCULAR | Qty: 10 | Status: AC

## 2018-01-30 NOTE — Discharge Summary (Addendum)
Discharge Summary    Patient ID: Brandy FinlandJanice Mulcahey,  MRN: [id number], DOB/AGE: [birth year] 72 y.o.  Admit date: 01/27/2018 Discharge date: 01/30/2018  Primary Care Provider: Kyla BalzarineMorehart, Jodi Primary Cardiologist: Norman HerrlichBrian Munley, MD  Discharge Diagnoses    Principal Problem:   Unstable angina [facility](HCC) Active Problems:   Chronic pain   Essential hypertension   GAD (generalized anxiety disorder)   GERD (gastroesophageal reflux disease)   Mixed hyperlipidemia   Raynaud's disease   Allergies Allergies  Allergen Reactions  . Aspirin Other (See Comments) and Nausea Only    Stomach pain Stomach pain Stomach pain   . Lisinopril Cough  . Topiramate     Other reaction(s): Other (See Comments) depression  . Alendronate Sodium Nausea And Vomiting  . Celecoxib Nausea Only  . Diphenhydramine Hcl Other (See Comments) and Rash  . Gabapentin Other (See Comments) and Nausea Only    Stomach pain  Stomach pain  . Nsaids Nausea Only  . Pravastatin Rash  . Sumatriptan Rash    Diagnostic Studies/Procedures    Left heart cath 01/29/18:  Prox LAD lesion is 95% stenosed.  Post intervention, there is a 0% residual stenosis.  A stent was successfully placed.  The left ventricular ejection fraction is 50-55% by visual estimate.  The left ventricular systolic function is normal.  Single-vessel coronary obstructive disease with 95% focal proximal LAD stenosis in the region of a small first diagonal vessel takeoff.   Normal left circumflex and dominant RCA.  Low normal global LV function with mild focal distal anterolateral hypocontractility. Ejection fraction is 50 to 55%.  Successful PCI to the proximal LAD with insertion of a 2.5 x 15 mm Resolute Onyx DES stent postdilated to 2.75 mm with the 95% stenosis being reduced to 0%.  RECOMMENDATION: Recommend uninterrupted dual antiplatelet therapy with Aspirin 81mg  daily and Ticagrelor 90mg  twice dailyfor a minimum of 12 months (ACS  - Class I recommendation). Patient will be rechallenged with statin therapy. If she is intolerant, initiate Zetia and if unable to achieve LDL less than 70 then PCSK9 inhibition. Optimal medical therapy for hypertension and with history of Raynaud's continue amlodipine/carvedilol.   History of Present Illness     Brandy Cervantes is a 72 y.o. female w/ h/o HTN, dyslipidemia but no prior h/o CAD transferred from [facility]([place]) for chest discomfort. She states she has had CP intermittently for more than 6 months; it has recently gotten worse over the past 3-4 weeks. She describes a midsternal burning or pressure type discomfort that comes on mostly after eating, but has happened a few times over the last few weeks w/o eating. It can be associated with SOB and diaphoresis. It lasts about 5-6 min before resolving. Sometimes baking soda will relieve the chest discomfort. She also describes DOE; strenuous activity, or even routine house or yard work, will make her SOB to the point of having to stop and rest for a few minutes. The CP does not always occur with the DOE.   [facility] showed possible new ischemic changes, and TTE showed RWMA suggestive of possible ischemia. LHC is planned for 01/28/18.   Hospital Course     Consultants: none  Unstable angina, CAD Left heart cath postponed one day due to cath schedule. Heart cath on 01/29/18 showed 95% stenosis in the proximal LAD, successfully treated with a DES. She tolerated the procedure well. Right groin C/D/I. No chest pain. DAPT with ASA and brilinta recommended. She is concerned about stomach upset  with ASA. I encouraged her to try to take the ASA for at least one month. Will also prescribe protonix.  Overnight last night, she did have a bout of shortness of breath with anxiety and near panic attack. This was resolved with valium. We discussed potential dyspnea with brilinta and use of caffeine.   HLD 01/28/2018: Cholesterol 207; HDL 46; LDL  Cholesterol 119; Triglycerides 210; VLDL 42 She is reluctant to take a statin. We discussed risks and benefits and she will try a low dose of crestor. If not at goal in 6 weeks, can try zetia and PCSK9 inhibitor.   HTN Pressures were elevated yesterday, but have been controlled today with coreg and norvasc - good regimen for her Raynaud's in her feet. No medication changes.    _____________  Discharge Vitals Blood pressure 134/68, pulse 74, temperature 97.6 F (36.4 C), resp. rate 15, height 5\' 3"  (1.6 m), weight 140 lb 14 oz (63.9 kg), SpO2 97 %.  Filed Weights   01/28/18 0401 01/29/18 0327 01/30/18 0614  Weight: 141 lb 15.6 oz (64.4 kg) 141 lb 14.4 oz (64.4 kg) 140 lb 14 oz (63.9 kg)    Labs & Radiologic Studies    CBC Recent Labs    01/29/18 0618 01/30/18 0222  WBC 7.1 8.6  HGB 14.3 14.2  HCT 44.2 43.9  MCV 92.1 92.2  PLT 290 294   Basic Metabolic Panel Recent Labs    16/10/96 2029  01/29/18 0618 01/30/18 0222  NA 143   < > 142 141  K 4.2   < > 4.0 3.5  CL 106   < > 108 110  CO2 26   < > 26 23  GLUCOSE 123*   < > 102* 132*  BUN 7*   < > 10 10  CREATININE 0.66   < > 0.62 0.73  CALCIUM 9.0   < > 8.8* 8.6*  MG 2.4  --   --   --    < > = values in this interval not displayed.   Liver Function Tests Recent Labs    01/27/18 2029 01/30/18 0222  AST 34 60*  ALT 29 53*  ALKPHOS 100 94  BILITOT 0.5 0.4  PROT 6.7 6.3*  ALBUMIN 3.5 3.2*   No results for input(s): LIPASE, AMYLASE in the last 72 hours. Cardiac Enzymes Recent Labs    01/27/18 2029 01/28/18 0326 01/28/18 0858  TROPONINI <0.03 <0.03 <0.03   BNP Invalid input(s): POCBNP D-Dimer No results for input(s): DDIMER in the last 72 hours. Hemoglobin A1C Recent Labs    01/27/18 2029  HGBA1C 5.7*   Fasting Lipid Panel Recent Labs    01/28/18 0326  CHOL 207*  HDL 46  LDLCALC 119*  TRIG 210*  CHOLHDL 4.5   Thyroid Function Tests Recent Labs    01/27/18 2029  TSH 3.068   _____________    No results found. Disposition   Pt is being discharged home today in good condition.  Follow-up Plans & Appointments    Follow-up Information    Baldo Daub, MD Follow up on 02/05/2018.   Specialty:  Cardiology Why:  11:20 am for hospital follow up Contact information: [address] [place] [address] [phone]          Discharge Instructions    AMB Referral to Cardiac Rehabilitation - Phase II   Complete by:  As directed    Diagnosis:  Coronary Stents   Amb Referral to Cardiac Rehabilitation   Complete  by:  As directed    Will send referal to [facility] [place]   Diagnosis:  Coronary Stents   Diet - low sodium heart healthy   Complete by:  As directed    Discharge instructions   Complete by:  As directed    No driving for 1 week. No lifting over 5 lbs for 1 week. No sexual activity for 1 week. Keep procedure site clean & dry. If you notice increased pain, swelling, bleeding or pus, call/return!  You may shower, but no soaking baths/hot tubs/pools for 1 week.   Increase activity slowly   Complete by:  As directed       Discharge Medications   Allergies as of 01/30/2018      Reactions   Aspirin Other (See Comments), Nausea Only   Stomach pain Stomach pain Stomach pain   Lisinopril Cough   Topiramate    Other reaction(s): Other (See Comments) depression   Alendronate Sodium Nausea And Vomiting   Celecoxib Nausea Only   Diphenhydramine Hcl Other (See Comments), Rash   Gabapentin Other (See Comments), Nausea Only   Stomach pain Stomach pain   Nsaids Nausea Only   Pravastatin Rash   Sumatriptan Rash      Medication List    TAKE these medications   amLODipine 10 MG tablet Commonly known as:  NORVASC Take 10 mg by mouth daily before breakfast.   carvedilol 3.125 MG tablet Commonly known as:  COREG Take 3.125 mg by mouth 2 (two) times daily.   cyclobenzaprine 5 MG tablet Commonly known as:  FLEXERIL Take 5 mg by mouth daily as needed.    fluticasone 50 MCG/ACT nasal spray Commonly known as:  FLONASE Place 2 sprays into both nostrils daily as needed for allergies.   HYDROcodone-acetaminophen 10-325 MG tablet Commonly known as:  NORCO Take 1 tablet by mouth every 6 (six) hours as needed for moderate pain.   morphine 30 MG 12 hr tablet Commonly known as:  MS CONTIN Take 30 mg by mouth every 12 (twelve) hours.   nabumetone 500 MG tablet Commonly known as:  RELAFEN Take 500 mg by mouth 2 (two) times daily as needed for mild pain.   nitroGLYCERIN 0.4 MG SL tablet Commonly known as:  NITROSTAT Place 1 tablet (0.4 mg total) under the tongue every 5 (five) minutes x 3 doses as needed for chest pain.   OVER THE COUNTER MEDICATION Take 1 tablet by mouth at bedtime as needed (for sleep). Calms Forte   pantoprazole 40 MG tablet Commonly known as:  PROTONIX Take 1 tablet (40 mg total) by mouth daily.   PARoxetine 10 MG tablet Commonly known as:  PAXIL Take 5 mg by mouth 2 (two) times daily.   QUNOL COQ10/UBIQUINOL/MEGA 100 MG Caps Generic drug:  Ubiquinol Take 1 capsule by mouth daily.   ranitidine 150 MG tablet Commonly known as:  ZANTAC Take 150 mg by mouth 2 (two) times daily as needed (takes with nabumetone or excedrin for acid reducer.).   rosuvastatin 20 MG tablet Commonly known as:  CRESTOR Take 1 tablet (20 mg total) by mouth at bedtime.   SENIOR MULTIVITAMIN PLUS Tabs Take 1 tablet by mouth daily.   VISION FORMULA EYE HEALTH PO Take 1 tablet by mouth daily.   STOOL SOFTENER PO Take 1 capsule by mouth as needed (for constipation).   ticagrelor 90 MG Tabs tablet Commonly known as:  BRILINTA Take 1 tablet (90 mg total) by mouth 2 (two) times daily.  triamcinolone cream 0.1 % Commonly known as:  KENALOG Apply 1 application topically 2 (two) times daily as needed (for itching).   vitamin C 1000 MG tablet Take 1,000 mg by mouth daily.   Vitamin D3 1000 units Caps Take 1,000 Units by mouth  daily.   zolpidem 10 MG tablet Commonly known as:  AMBIEN Take 5 mg by mouth at bedtime as needed for sleep.        Outstanding Labs/Studies   Lipids and LFTs  Duration of Discharge Encounter   Greater than 30 minutes including physician time.  Signed, Roe Rutherford [facility], PA 01/30/2018, 11:06 AM  I have examined the patient and reviewed assessment and plan and discussed with patient.  Agree with above as stated.  Right groin stable, without hematoma.  2+ right posterior tibial pulse.  Telemetry without any significant abnormalities.  She is feeling well.  She walked with cardiac rehab.  I stressed the importance of dual antiplatelet therapy.  She would benefit from lipid-lowering therapy.  Hopefully, she will tolerate low-dose Crestor.  She will follow-up with Dr. Dulce Sellar.  Lance Muss

## 2018-01-30 NOTE — Progress Notes (Signed)
CARDIAC REHAB PHASE I   PRE:  Rate/Rhythm: 94 SR  BP:  Sitting: 141/73      SaO2: 97 RA  MODE:  Ambulation: 1000 ft   POST:  Rate/Rhythm: 91 SR  BP:  Sitting: 117/68    SaO2: 98 RA   Pt ambulated 10700ft in hallway independently with steady gait. No c/o CP or SOB. Pt educated on importance of Brilinta, ASA, and NTG. Pt given heart healthy diet, has stent card at bedside. Reviewed restrictions and exercise guidelines. Encouraged stress reduction as pt recognizes several stressors in her life. Will refer to [facility] [place].  [id number] Reynold Boweneresa  Delia Slatten, RN BSN 01/30/2018 10:06 AM

## 2018-01-30 NOTE — Care Management Note (Addendum)
Case Management Note  Patient Details  Name: Brandy Cervantes MRN: [id number] Date of Birth: 1946/06/08  Subjective/Objective:   From home, s/p stent intervention, will be on brilinta, co pay of 7.47. Patient will go to [facility] in [place] on [address]. They do have brilinta in stock.                Action/Plan: DC home when ready.   Expected Discharge Date:                  Expected Discharge Plan:  Home/Self Care  In-House Referral:     Discharge planning Services  CM Consult, Medication Assistance  Post Acute Care Choice:    Choice offered to:     DME Arranged:    DME Agency:     HH Arranged:    HH Agency:     Status of Service:  Completed, signed off  If discussed at [employer] Length of Stay Meetings, dates discussed:    Additional Comments:  [name], [name], RN 01/30/2018, 9:29 AM

## 2018-01-30 NOTE — Progress Notes (Addendum)
Progress Note  Patient Name: Brandy FinlandJanice Cervantes Date of Encounter: 01/30/2018  Primary Cardiologist: Norman HerrlichBrian Munley, MD   Subjective   Pt denies chest pain. She had some shortness of breath and anxiety/near panic attack last night relieved with valium.  Inpatient Medications    Scheduled Meds: . amLODipine  10 mg Oral QAC breakfast  . atorvastatin  40 mg Oral q1800  . carvedilol  3.125 mg Oral BID  . cholecalciferol  1,000 Units Oral Daily  . famotidine  20 mg Oral BID  . morphine  30 mg Oral Q12H  . multivitamin  1 tablet Oral Daily  . PARoxetine  5 mg Oral BID  . sodium chloride flush  3 mL Intravenous Q12H  . ticagrelor  90 mg Oral BID  . vitamin C  1,000 mg Oral Daily   Continuous Infusions: . sodium chloride 75 mL/hr at 01/29/18 2339  . sodium chloride     PRN Meds: sodium chloride, acetaminophen, cyclobenzaprine, diazepam, fluticasone, HYDROcodone-acetaminophen, nabumetone, nitroGLYCERIN, ondansetron (ZOFRAN) IV, sodium chloride flush, zolpidem   Vital Signs    Vitals:   01/29/18 1953 01/30/18 0551 01/30/18 0614 01/30/18 0756  BP: (!) 162/60 (!) 152/85  134/68  Pulse: 68 70  74  Resp: 14 16  15   Temp: 97.8 F (36.6 C) 97.9 F (36.6 C)  97.6 F (36.4 C)  TempSrc: Oral Oral    SpO2: 100% 100%  97%  Weight:   140 lb 14 oz (63.9 kg)   Height:        Intake/Output Summary (Last 24 hours) at 01/30/2018 0827 Last data filed at 01/30/2018 0631 Gross per 24 hour  Intake 2559.9 ml  Output 3075 ml  Net -515.1 ml   Filed Weights   01/28/18 0401 01/29/18 0327 01/30/18 0614  Weight: 141 lb 15.6 oz (64.4 kg) 141 lb 14.4 oz (64.4 kg) 140 lb 14 oz (63.9 kg)    Telemetry    sinus - Personally Reviewed  ECG    Sinus, TWI in all precordial leads - Personally Reviewed  Physical Exam   GEN: No acute distress.   Neck: No JVD Cardiac: RRR, no murmurs, rubs, or gallops.  Respiratory: Clear to auscultation bilaterally. GI: Soft, nontender, non-distended. Right groin  C/D/I MS: No edema; No deformity. Neuro:  Nonfocal  Psych: Normal affect   Labs    Chemistry Recent Labs  Lab 01/27/18 2029 01/28/18 0326 01/29/18 0618 01/30/18 0222  NA 143 142 142 141  K 4.2 4.1 4.0 3.5  CL 106 106 108 110  CO2 26 29 26 23   GLUCOSE 123* 100* 102* 132*  BUN 7* 8 10 10   CREATININE 0.66 0.69 0.62 0.73  CALCIUM 9.0 8.7* 8.8* 8.6*  PROT 6.7  --   --  6.3*  ALBUMIN 3.5  --   --  3.2*  AST 34  --   --  60*  ALT 29  --   --  53*  ALKPHOS 100  --   --  94  BILITOT 0.5  --   --  0.4  GFRNONAA >60 >60 >60 >60  GFRAA >60 >60 >60 >60  ANIONGAP 11 7 8 8      Hematology Recent Labs  Lab 01/28/18 0326 01/29/18 0618 01/30/18 0222  WBC 8.3 7.1 8.6  RBC 4.58 4.80 4.76  HGB 13.6 14.3 14.2  HCT 42.4 44.2 43.9  MCV 92.6 92.1 92.2  MCH 29.7 29.8 29.8  MCHC 32.1 32.4 32.3  RDW 12.4 12.6 12.6  PLT  282 290 294    Cardiac Enzymes Recent Labs  Lab 01/27/18 2029 01/28/18 0326 01/28/18 0858  TROPONINI <0.03 <0.03 <0.03   No results for input(s): TROPIPOC in the last 168 hours.   BNPNo results for input(s): BNP, PROBNP in the last 168 hours.   DDimer No results for input(s): DDIMER in the last 168 hours.   Radiology    No results found.  Cardiac Studies   Left heart cath 01/29/18:  Prox LAD lesion is 95% stenosed.  Post intervention, there is a 0% residual stenosis.  A stent was successfully placed.  The left ventricular ejection fraction is 50-55% by visual estimate.  The left ventricular systolic function is normal.   Single-vessel coronary obstructive disease with 95% focal proximal LAD stenosis in the region of a small first diagonal vessel takeoff.    Normal left circumflex and dominant RCA.  Low normal global LV function with mild focal distal anterolateral hypocontractility.  Ejection fraction is 50 to 55%.  Successful PCI to the proximal LAD with insertion of a 2.5 x 15 mm Resolute Onyx DES stent postdilated to 2.75 mm with the 95%  stenosis being reduced to 0%.  RECOMMENDATION: Recommend uninterrupted dual antiplatelet therapy with Aspirin 81mg  daily and Ticagrelor 90mg  twice daily for a minimum of 12 months (ACS - Class I recommendation).  Patient will be rechallenged with statin therapy.  If she is intolerant, initiate Zetia and if unable to achieve LDL less than 70 then PCSK9 inhibition.  Optimal medical therapy for hypertension and with history of Raynaud's continue amlodipine/carvedilol.  Patient Profile     72 y.o. female  w/ h/o HTN, dyslipidemia but no prior h/o CAD transferred from OSH for chest discomfort. Heart cath yesterday.  Assessment & Plan    1. Unstable angina - troponin remained negative, but EKG with diffuse TWI in precordial leads - heart cath 01/29/18 with 99% stenosis in the proximal LAD treated with DES - pt tolerated the procedure well - no chest pain today - she will be discharged on DAPT with ASA and brilinta- she is concerned about tolerating the ASA. We discussed trying to stay on it for at least 1 month   2. HTN - continue amlodipine and carvedilol - good regimen for Raynaud's   3. HLD - 01/28/2018: Cholesterol 207; HDL 46; LDL Cholesterol 119; Triglycerides 210; VLDL 42 - will start low dose crestor - she is reluctant to start a statin - may need referral to lipid clinic   For questions or updates, please contact [facility] Please consult [email or web address] for contact info under Cardiology/STEMI.      Signed, Roe Rutherford [facility], PA  01/30/2018, 8:27 AM    I have examined the patient and reviewed assessment and plan and discussed with patient.  Agree with above as stated.  Right groin stable, without hematoma.  2+ right posterior tibial pulse.  Telemetry without any significant abnormalities.  She is feeling well.  She walked with cardiac rehab.  I stressed the importance of dual antiplatelet therapy.  She would benefit from lipid-lowering therapy.  Hopefully, she will tolerate  low-dose Crestor.  She will follow-up with Dr. Dulce Sellar.  Lance Muss

## 2018-02-04 DIAGNOSIS — I25119 Atherosclerotic heart disease of native coronary artery with unspecified angina pectoris: Secondary | ICD-10-CM | POA: Insufficient documentation

## 2018-02-04 DIAGNOSIS — I2510 Atherosclerotic heart disease of native coronary artery without angina pectoris: Secondary | ICD-10-CM

## 2018-02-04 HISTORY — DX: Atherosclerotic heart disease of native coronary artery with unspecified angina pectoris: I25.119

## 2018-02-04 HISTORY — DX: Atherosclerotic heart disease of native coronary artery without angina pectoris: I25.10

## 2018-02-04 NOTE — Progress Notes (Signed)
Cardiology Office Note:    Date:  02/05/2018   ID:  Brandy Cervantes Miklas, DOB 10-May-1946, MRN [id number]  PCP:  Kyla BalzarineMorehart, Jodi, PA-C  Cardiologist:  Norman HerrlichBrian Munley, MD    Referring MD: Kyla BalzarineMorehart, Jodi, PA-C    ASSESSMENT:    1. CAD in native artery   2. Essential hypertension   3. Mixed hyperlipidemia    PLAN:    In order of problems listed above:  1. Improved she will continue dual antiplatelet therapy lipid-lowering treatment and referred to cardiac rehabilitation her EKG still shows a pattern of injury and I will plan after next visit in 6 weeks check echocardiogram for the segmental left ventricular dysfunction that we assume this time myocardium.   2. Stable continue current medical treatment 3. Continue lipid-lowering therapy with plan to check lipids at her next office visit 4.    Next appointment: 6 weeks   Medication Adjustments/Labs and Tests Ordered: Current medicines are reviewed at length with the patient today.  Concerns regarding medicines are outlined above.  Orders Placed This Encounter  Procedures  . EKG 12-Lead   Meds ordered this encounter  Medications  . aspirin EC 81 MG tablet    Sig: Take 1 tablet (81 mg total) by mouth daily.    Dispense:  30 tablet    Refill:  3    Chief Complaint  Patient presents with  . Follow-up    after cath and PCI of LAD  . Coronary Artery Disease    History of Present Illness:    Brandy Cervantes Mara is a 72 y.o. female with a hx of Troponin normal unstable angina and recent PCI and DES of proximal LAD 95% stenosisi EF 50-55% last seen 01/25/18. Compliance with diet, lifestyle and medications: Yes  She is improved strength and endurance and has had one episode of atypical chest pain relieved with nitroglycerin few days after she was home.  She tolerates aspirin previously had GI distress but is taken a PPI is compliant with her medications and wants to enroll in cardiac rehabilitation I referred her today.  No typical angina  shortness of breath palpitation or syncope Past Medical History:  Diagnosis Date  . Anxiety   . Arthritis    "all over" (02/01/2018)  . 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 Bilateral knee pain 10/28/2013  . Bilateral primary osteoarthritis of knee 11/17/2016  . Chronic back pain    "qwhere" (01/29/2018)  . Chronic pain 10/18/2015  . Degenerative disc disease, cervical 10/28/2013  . Degenerative disc disease, lumbar 10/28/2013  . Depression   . Esophageal dysphagia 12/31/2017  . Essential hypertension 10/18/2015  . Exposure to TB    "my mother died when she was [age] of TB"  . GAD (generalized anxiety disorder) 10/18/2015  . GERD (gastroesophageal reflux disease) 12/31/2017  . History of gout    "I've had it occasionally" (11/23/2016)  . Hypertension   . Insomnia 10/18/2015  . Migraine    "a few/year; more in the last few weeks" (01/29/2018)  . Mixed hyperlipidemia 10/19/2017  . Neck pain 10/28/2013  . Osteoarthritis 10/18/2015  . Osteoporosis 10/18/2015  . Other chest pain 12/31/2017  . Pain syndrome, chronic 10/28/2013  . Raynaud's disease    feet    Past Surgical History:  Procedure Laterality Date  . CARDIAC CATHETERIZATION    . CORONARY ANGIOPLASTY    . CORONARY STENT INTERVENTION N/A 01/29/2018   Procedure: CORONARY STENT INTERVENTION;  Surgeon: Lennette BihariKelly, Thomas A, MD;  [facility];  Service: Cardiovascular;  Laterality: N/A;  . JOINT REPLACEMENT    . LEFT HEART CATH AND CORONARY ANGIOGRAPHY N/A 01/29/2018   Procedure: LEFT HEART CATH AND CORONARY ANGIOGRAPHY;  Surgeon: Lennette Bihari, MD;  Location: [facility] INVASIVE CV LAB;  Service: Cardiovascular;  Laterality: N/A;  . MASTOIDECTOMY Right ~ 1955  . TOTAL KNEE ARTHROPLASTY Bilateral 11/22/2016   Procedure: TOTAL KNEE BILATERAL;  Surgeon: Gean Birchwood, MD;  Location: [facility] OR;  Service: Orthopedics;  Laterality: Bilateral;    Current Medications: Current Meds  Medication Sig  . amLODipine (NORVASC) 10 MG tablet Take 10 mg by  mouth daily before breakfast.  . Ascorbic Acid (VITAMIN C) 1000 MG tablet Take 1,000 mg by mouth daily.  . carvedilol (COREG) 3.125 MG tablet Take 3.125 mg by mouth 2 (two) times daily.   . Cholecalciferol (VITAMIN D3) 1000 units CAPS Take 1,000 Units by mouth daily.  . cyclobenzaprine (FLEXERIL) 5 MG tablet Take 5 mg by mouth daily as needed.  Tery Sanfilippo Calcium (STOOL SOFTENER PO) Take 1 capsule by mouth as needed (for constipation).  . fluticasone (FLONASE) 50 MCG/ACT nasal spray Place 2 sprays into both nostrils daily as needed for allergies.   Marland Kitchen HYDROcodone-acetaminophen (NORCO) 10-325 MG tablet Take 1 tablet by mouth every 6 (six) hours as needed for moderate pain.  Marland Kitchen morphine (MS CONTIN) 30 MG 12 hr tablet Take 30 mg by mouth every 12 (twelve) hours.  . Multiple Vitamins-Minerals (SENIOR MULTIVITAMIN PLUS) TABS Take 1 tablet by mouth daily.  . Multiple Vitamins-Minerals (VISION FORMULA EYE HEALTH PO) Take 1 tablet by mouth daily.  . nabumetone (RELAFEN) 500 MG tablet Take 500 mg by mouth 2 (two) times daily as needed for mild pain.  . nitroGLYCERIN (NITROSTAT) 0.4 MG SL tablet Place 1 tablet (0.4 mg total) under the tongue every 5 (five) minutes x 3 doses as needed for chest pain.  Marland Kitchen OVER THE COUNTER MEDICATION Take 1 tablet by mouth at bedtime as needed (for sleep). Calms Forte  . pantoprazole (PROTONIX) 40 MG tablet Take 1 tablet (40 mg total) by mouth daily.  Marland Kitchen PARoxetine (PAXIL) 10 MG tablet Take 5 mg by mouth 2 (two) times daily.  . ranitidine (ZANTAC) 150 MG tablet Take 150 mg by mouth 2 (two) times daily as needed (takes with nabumetone or excedrin for acid reducer.).   Marland Kitchen rosuvastatin (CRESTOR) 20 MG tablet Take 1 tablet (20 mg total) by mouth at bedtime.  . ticagrelor (BRILINTA) 90 MG TABS tablet Take 1 tablet (90 mg total) by mouth 2 (two) times daily.  Marland Kitchen triamcinolone cream (KENALOG) 0.1 % Apply 1 application topically 2 (two) times daily as needed (for itching).   . Ubiquinol  (QUNOL COQ10/UBIQUINOL/MEGA) 100 MG CAPS Take 1 capsule by mouth daily.  Marland Kitchen zolpidem (AMBIEN) 10 MG tablet Take 5 mg by mouth at bedtime as needed for sleep.     Allergies:   Aspirin; Lisinopril; Topiramate; Alendronate sodium; Celecoxib; Diphenhydramine hcl; Gabapentin; Nsaids; Pravastatin; and Sumatriptan   Social History   Socioeconomic History  . Marital status: Married    Spouse name: Not on file  . Number of children: Not on file  . Years of education: Not on file  . Highest education level: Not on file  Occupational History  . Not on file  Social Needs  . Financial resource strain: Not on file  . Food insecurity:    Worry: Not on file    Inability: Not on file  . Transportation needs:  Medical: Not on file    Non-medical: Not on file  Tobacco Use  . Smoking status: Never Smoker  . Smokeless tobacco: Never Used  Substance and Sexual Activity  . Alcohol use: No  . Drug use: No  . Sexual activity: Not Currently  Lifestyle  . Physical activity:    Days per week: Not on file    Minutes per session: Not on file  . Stress: Not on file  Relationships  . Social connections:    Talks on phone: Not on file    Gets together: Not on file    Attends religious service: Not on file    Active member of club or organization: Not on file    Attends meetings of clubs or organizations: Not on file    Relationship status: Not on file  Other Topics Concern  . Not on file  Social History Narrative  . Not on file     Family History: The patient's family history includes Heart attack in her father; Heart disease in her brother; Hyperlipidemia in her father; Tuberculosis in her mother. ROS:   Please see the history of present illness.    All other systems reviewed and are negative.  EKGs/Labs/Other Studies Reviewed:    The following studies were reviewed today:  EKG:  EKG ordered today.  The ekg ordered today demonstrates sinus rhythm continued diffuse ischemic T wave  inversion  EKG latest 01/31/18 with [facility] and deep precordial ischemic T wave inversion  Recent Labs:  Ref Range & Units 8d ago (01/28/18) 8d ago (01/28/18) 9d ago (01/27/18)  Troponin I <0.03 ng/mL <0.03  <0.03 CM <0.03 CM  Comment: Performed at [facility], [address] [place], [place] [address]    01/27/2018: Magnesium 2.4; TSH 3.068 01/30/2018: ALT 53; BUN 10; Creatinine, Ser 0.73; Hemoglobin 14.2; Platelets 294; Potassium 3.5; Sodium 141  Recent Lipid Panel    Component Value Date/Time   CHOL 207 (H) 01/28/2018 0326   TRIG 210 (H) 01/28/2018 0326   HDL 46 01/28/2018 0326   CHOLHDL 4.5 01/28/2018 0326   VLDL 42 (H) 01/28/2018 0326   LDLCALC 119 (H) 01/28/2018 0326    Physical Exam:    VS:  BP 110/74 (BP Location: Left Arm, Patient Position: Sitting, Cuff Size: Normal)   Pulse 71   Ht 5' 3.5" (1.613 m)   Wt 142 lb 6.4 oz (64.6 kg)   SpO2 98%   BMI 24.83 kg/m     Wt Readings from Last 3 Encounters:  02/05/18 142 lb 6.4 oz (64.6 kg)  01/30/18 140 lb 14 oz (63.9 kg)  01/25/18 139 lb 12.8 oz (63.4 kg)     GEN:  Well nourished, well developed in no acute distress HEENT: Normal NECK: No JVD; No carotid bruits LYMPHATICS: No lymphadenopathy CARDIAC: RRR, no murmurs, rubs, gallops RESPIRATORY:  Clear to auscultation without rales, wheezing or rhonchi  ABDOMEN: Soft, non-tender, non-distended MUSCULOSKELETAL:  No edema; No deformity  SKIN: Warm and dry NEUROLOGIC:  Alert and oriented x 3 PSYCHIATRIC:  Normal affect    Signed, Norman Herrlich, MD  02/05/2018 2:14 PM    [facility]

## 2018-02-05 ENCOUNTER — Encounter: Payer: Self-pay | Admitting: Cardiology

## 2018-02-05 ENCOUNTER — Ambulatory Visit: Payer: Medicare Other | Admitting: Cardiology

## 2018-02-05 VITALS — BP 110/74 | HR 71 | Ht 63.5 in | Wt 142.4 lb

## 2018-02-05 DIAGNOSIS — I2510 Atherosclerotic heart disease of native coronary artery without angina pectoris: Secondary | ICD-10-CM | POA: Diagnosis not present

## 2018-02-05 DIAGNOSIS — E782 Mixed hyperlipidemia: Secondary | ICD-10-CM | POA: Diagnosis not present

## 2018-02-05 DIAGNOSIS — I10 Essential (primary) hypertension: Secondary | ICD-10-CM

## 2018-02-05 MED ORDER — ASPIRIN EC 81 MG PO TBEC: 81.0000 mg | DELAYED_RELEASE_TABLET | Freq: Every day | ORAL | 3 refills | Status: DC

## 2018-02-05 NOTE — Patient Instructions (Signed)
Medication Instructions:  Your physician recommends that you continue on your current medications as directed. Please refer to the Current Medication list given to you today.   Labwork: NONE  Testing/Procedures: You had an EKG today  Dr Dulce SellarMunley has referred [facility].    Follow-Up: Your physician recommends that you schedule a follow-up appointment in: 6 weeks  Any Other Special Instructions Will Be Listed Below (If Applicable).     If you need a refill on your cardiac medications before your next appointment, please call your pharmacy.

## 2018-02-07 DIAGNOSIS — Z955 Presence of coronary angioplasty implant and graft: Secondary | ICD-10-CM

## 2018-02-07 HISTORY — DX: Presence of coronary angioplasty implant and graft: Z95.5

## 2018-02-08 NOTE — Research (Signed)
RAD[facility] Informed Consent   Subject Name: Brandy Cervantes  Subject met inclusion and exclusion criteria.  The informed consent form, study requirements and expectations were reviewed with the subject and questions and concerns were addressed prior to the signing of the consent form.  The subject verbalized understanding of the trail requirements.  The subject agreed to participate in the [facility] trial and signed the informed consent.  The informed consent was obtained prior to performance of any protocol-specific procedures for the subject.  A copy of the signed informed consent was given to the subject and a copy was placed in the subject's medical record.  [name] 01/29/2018, 9:40 AM

## 2018-02-26 ENCOUNTER — Telehealth: Payer: Self-pay | Admitting: Cardiology

## 2018-02-26 DIAGNOSIS — I200 Unstable angina: Secondary | ICD-10-CM

## 2018-02-26 NOTE — Addendum Note (Signed)
Addended by: Lita MainsBOWMAN, Dariya Gainer R on: 02/26/2018 04:47 PM   Modules accepted: Orders

## 2018-02-26 NOTE — Telephone Encounter (Signed)
Had chest pressure last night and took 2 nitros-has slight pain in arm-had stents July 16th

## 2018-02-26 NOTE — Telephone Encounter (Signed)
Patient reports while at Cardiac Rehab yesterday she was feeling ok while on last exercise [facility] "to have no color" and was given orange juice.  Patient had errands to run and later returned him when she experienced "chest discomfort along with shortness of breath."  Patient states that she took one nitroglycerin with no relief, and took a second nitroglycerin and went to sleep. Patient reports left arm pain and shortness of breath when she woke up this morning.  Patient denies any chest pain or shortness of breath at this time.  Patient was advised to report to nearest ED or call 911 immediately if chest pain occurs. Will follow up with Dr Dulce SellarMunley.

## 2018-02-26 NOTE — Telephone Encounter (Signed)
Consulted with Dr. Bing MatterKrasowski about this patient. Dr. Bing MatterKrasowski advised patient to have troponin I drawn and see Dr. Dulce SellarMunley as soon as possible. Patient will be scheduled to see Dr. Dulce SellarMunley tomorrow and have labs drawn as well. Patient made aware and verbally understands.

## 2018-02-27 ENCOUNTER — Ambulatory Visit (HOSPITAL_BASED_OUTPATIENT_CLINIC_OR_DEPARTMENT_OTHER)
Admission: RE | Admit: 2018-02-27 | Discharge: 2018-02-27 | Disposition: A | Discharge status: Home / Self Care | Payer: Medicare Other | Source: Ambulatory Visit | Attending: Cardiology | Admitting: Cardiology

## 2018-02-27 ENCOUNTER — Ambulatory Visit: Payer: Medicare Other | Admitting: Cardiology

## 2018-02-27 ENCOUNTER — Encounter: Payer: Self-pay | Admitting: Cardiology

## 2018-02-27 VITALS — BP 120/74 | HR 78 | Ht 63.5 in | Wt 142.1 lb

## 2018-02-27 DIAGNOSIS — E782 Mixed hyperlipidemia: Secondary | ICD-10-CM | POA: Diagnosis not present

## 2018-02-27 DIAGNOSIS — I200 Unstable angina: Secondary | ICD-10-CM

## 2018-02-27 DIAGNOSIS — I10 Essential (primary) hypertension: Secondary | ICD-10-CM | POA: Diagnosis not present

## 2018-02-27 DIAGNOSIS — Z01818 Encounter for other preprocedural examination: Secondary | ICD-10-CM | POA: Diagnosis not present

## 2018-02-27 MED ORDER — ISOSORBIDE MONONITRATE ER 30 MG PO TB24: 30.0000 mg | ORAL_TABLET | Freq: Every day | ORAL | 3 refills | Status: DC

## 2018-02-27 NOTE — Patient Instructions (Addendum)
Medication Instructions:  Your physician has recommended you make the following change in your medication:   START isosorbide mononitrate (imdur) 30 mg daily   Labwork: Your physician recommends that you return for lab work today: CBC, BMP, PT/INR, STAT troponin.   Testing/Procedures: You had an EKG today.   A chest x-ray takes a picture of the organs and structures inside the chest, including the heart, lungs, and blood vessels. This test can show several things, including, whether the heart is enlarges; whether fluid is building up in the lungs; and whether pacemaker / defibrillator leads are still in place.     [facility] [place] [address], [address] [place] [address] Dept: [phone] Loc: [phone]  Brandy FinlandJanice Cervantes  02/27/2018  You are scheduled for a Cardiac Catheterization on Monday, August 19 with Dr. Cristal Deerhristopher End.  1. [facility]: [address] [place], [place] [address] at 5:30 AM (This time is two hours before your procedure to ensure your preparation). Free valet parking service is available.   Special note: Every effort is made to have your procedure done on time. Please understand that emergencies sometimes delay scheduled procedures.  2. Diet: Do not eat solid foods after midnight.  The patient may have clear liquids until 5am upon the day of the procedure.  3. Labs: None needed.   4. Medication instructions in preparation for your procedure:   Contrast Allergy: No  On the morning of your procedure, take your Brilinta/Ticagrelor, aspirin, and any morning medicines NOT listed above.  You may use sips of water.  5. Plan for one night stay--bring personal belongings. 6. Bring a current list of your medications and current insurance cards. 7. You MUST have a responsible person to drive you home. 8. Someone MUST be  with you the first 24 hours after you arrive home or your discharge will be delayed. 9. Please wear clothes that are easy to get on and off and wear slip-on shoes.  Thank you for allowing us to care for you!   -- [facility]   Follow-Up: Your physician recommends that you schedule a follow-up appointment in: 3 weeks.   If you need a refill on your cardiac medications before your next appointment, please call your pharmacy.   Thank you for choosing [facility]! Brandy Gemmaatherine Ellesse Antenucci, RN [phone]    Carotid Angioplasty With Stent, Care After These instructions give you information about caring for yourself after your procedure. Your doctor may also give you more specific instructions. Call your doctor if you have any problems or questions after your procedure. Follow these instructions at home: Medicines  Take over-the-counter and prescription medicines only as told by your doctor.  If you were prescribed an antibiotic medicine, take it as told by your doctor. Do not stop taking the antibiotic even if you start to feel better. Caring for Your Cut from Surgery   Keep your cut clean and dry.  Follow instructions from your doctor about how to take care of your cut from surgery (incision). Make sure you: ? Wash your hands with soap and water before you change your bandage (dressing). If you cannot use soap and water, use hand sanitizer. ? Change your bandage as told by your doctor. ? Leave stitches (sutures), skin glue, or skin tape (adhesive) strips in place. They may need to stay in place for 2  weeks or longer. If tape strips get loose and curl up, you may trim the loose edges. Do not remove tape strips completely unless your doctor says it is okay.  Check the area around your cut every day for signs of infection. Check for: ? More redness, swelling, or pain. ? More fluid or blood. ? Warmth. ? Pus or a bad smell. Activity  Return to your  normal activities as told by your doctor. Ask your doctor what activities are safe for you.  Do not lift anything that is heavier than 10 lb (4.5 kg) until your doctor says it is okay.  Avoid sexual activity until your doctor says that this is safe for you.  Exercise regularly, as told by your doctor. Ask your doctor what types of exercise are safe for you. Eating and drinking   Follow instructions from your doctor about what you cannot eat or drink.  Drink enough fluid to keep your pee (urine) clear or pale yellow.  Eat a heart-healthy diet. This should include lots of fresh fruits and vegetables. Meat should be lean cuts. Avoid foods that are: ? High in salt, saturated fat, or sugar. ? Canned or highly processed. ? Fried. Lifestyle  Limit alcohol intake to no more than 1 drink per day for nonpregnant women and 2 drinks per day for men. One drink equals 12 oz of beer, 5 oz of wine, or 1 oz of hard liquor.  Do not use any tobacco products, such as cigarettes, chewing tobacco, or e-cigarettes. If you need help quitting, ask your doctor.  Work with your doctor to keep your blood pressure under control.  Stay at a healthy weight. General instructions  Do not drive or use heavy machinery while taking prescription pain medicine.  Do not take baths, swim, or use a hot tub until your doctor approves.  Tell all doctors who care for you that you have a stent.  Keep all follow-up visits as told by your doctor. This is important. Contact a doctor if:  You have more redness, swelling, or pain around your cut.  You have more fluid or blood coming from your cut.  The area around your cut feels warm to the touch.  You have pus or a bad smell coming from your cut.  You have a lump caused by bleeding under your skin (hematoma), and the lump does not go away after 2 weeks.  You have a fever. Get help right away if:  You have vision changes or loss of vision.  You have numbness or  weakness on one side of your body.  You have trouble talking.  You have slurred speech or you cannot speak (aphasia).  You suddenly feel very confused.  You notice a lump caused by bleeding under your skin and the lump is quickly getting larger (expanding).  You suddenly get pain in the area where your stent was placed.  Your cut from surgery starts to bleed and does not stop after you hold pressure on it for 30 minutes. These symptoms may be an emergency. Do not wait to see if the symptoms will go away. Get help right away. Call your local emergency services (911 in [place] Do not drive yourself to the hospital. This information is not intended to replace advice given to you by your health care provider. Make sure you discuss any questions you have with your health care provider. Document Released: 07/08/2013 Document Revised: 12/09/2015 Document Reviewed: 03/28/2015 Elsevier Interactive Patient Education  2018  Elsevier Inc.    Cardiac-Specific Troponin I and T Test Why am I having this test? You may have this test if you have experienced chest pain. The test can be used to determine if you have had a heart attack or injury to heart (cardiac) muscle. This test can also help predict the possibility of future heart attacks. This test measures the concentration of cardiac-specific troponin in your blood. Troponins are proteins that help muscles contract. There are three forms of troponin, including troponins C, I, and T. The types of troponins I and T that are found in cardiac muscle are different from the troponins I and T that are found in skeletal muscle. Therefore, testing can be done for cardiac-specific troponins I and T. These types of troponin are normally present in very small quantities in the blood. When there is damage to heart muscle cells, cardiac troponins I and T are released into circulation. The more damage there is, the greater the concentration of troponins I and T. When a  person has a heart attack, levels of troponin can become elevated in the blood within 3-4 hours after injury and may remain elevated for 10-14 days. What kind of sample is taken? A blood sample is required for this test. It is usually collected by inserting a needle into a vein. Usually, an initial blood sample is collected, and then another blood sample is collected 12 hours later. After these samples, you will have your blood tested daily for 3-5 days. You might also have it tested weekly for 5-6 weeks. How do I prepare for this test? There is no preparation required for this test. However, be aware that you will need to make arrangements to have your blood collected frequently. What are the reference ranges? Reference values are considered healthy values established after testing a large group of healthy people. Reference values may vary among different people, labs, and hospitals. It is your responsibility to obtain your test results. Ask the lab or department performing the test when and how you will get your results. Reference values for cardiac troponins are as follows:  Cardiac troponin T: less than 0.1 ng/mL.  Cardiac troponin I: less than 0.03 ng/mL.  What do the results mean? Troponin values above the reference values may indicate:  Injury to the heart muscle.  Heart attack.  Talk with your health care provider to discuss your results, treatment options, and if necessary, the need for more tests. Talk with your health care provider if you have any questions about your results. Talk with your health care provider to discuss your results, treatment options, and if necessary, the need for more tests. Talk with your health care provider if you have any questions about your results. This information is not intended to replace advice given to you by your health care provider. Make sure you discuss any questions you have with your health care provider. Document Released: 08/05/2004  Document Revised: 03/07/2016 Document Reviewed: 11/26/2013 Elsevier Interactive Patient Education  2018 Elsevier Inc.   Isosorbide Mononitrate extended-release tablets What is this medicine? ISOSORBIDE MONONITRATE (eye soe SOR bide mon oh NYE trate) is a vasodilator. It relaxes blood vessels, increasing the blood and oxygen supply to your heart. This medicine is used to prevent chest pain caused by angina. It will not help to stop an episode of chest pain. This medicine may be used for other purposes; ask your health care provider or pharmacist if you have questions. COMMON BRAND NAME(S): Imdur, Isotrate ER What  should I tell my health care provider before I take this medicine? They need to know if you have any of these conditions: -previous heart attack or heart failure -an unusual or allergic reaction to isosorbide mononitrate, nitrates, other medicines, foods, dyes, or preservatives -pregnant or trying to get pregnant -breast-feeding How should I use this medicine? Take this medicine by mouth with a glass of water. Follow the directions on the prescription label. Do not crush or chew. Take your medicine at regular intervals. Do not take your medicine more often than directed. Do not stop taking this medicine except on the advice of your doctor or health care professional. Talk to your pediatrician regarding the use of this medicine in children. Special care may be needed. Overdosage: If you think you have taken too much of this medicine contact a poison control center or emergency room at once. NOTE: This medicine is only for you. Do not share this medicine with others. What if I miss a dose? If you miss a dose, take it as soon as you can. If it is almost time for your next dose, take only that dose. Do not take double or extra doses. What may interact with this medicine? Do not take this medicine with any of the following medications: -medicines used to treat erectile dysfunction (ED)  like avanafil, sildenafil, tadalafil, and vardenafil -riociguat This medicine may also interact with the following medications: -medicines for high blood pressure -other medicines for angina or heart failure This list may not describe all possible interactions. Give your health care provider a list of all the medicines, herbs, non-prescription drugs, or dietary supplements you use. Also tell them if you smoke, drink alcohol, or use illegal drugs. Some items may interact with your medicine. What should I watch for while using this medicine? Check your heart rate and blood pressure regularly while you are taking this medicine. Ask your doctor or health care professional what your heart rate and blood pressure should be and when you should contact him or her. Tell your doctor or health care professional if you feel your medicine is no longer working. You may get dizzy. Do not drive, use machinery, or do anything that needs mental alertness until you know how this medicine affects you. To reduce the risk of dizzy or fainting spells, do not sit or stand up quickly, especially if you are an older patient. Alcohol can make you more dizzy, and increase flushing and rapid heartbeats. Avoid alcoholic drinks. Do not treat yourself for coughs, colds, or pain while you are taking this medicine without asking your doctor or health care professional for advice. Some ingredients may increase your blood pressure. What side effects may I notice from receiving this medicine? Side effects that you should report to your doctor or health care professional as soon as possible: -bluish discoloration of lips, fingernails, or palms of hands -irregular heartbeat, palpitations -low blood pressure -nausea, vomiting -persistent headache -unusually weak or tired Side effects that usually do not require medical attention (report to your doctor or health care professional if they continue or are bothersome): -flushing of the face  or neck -rash This list may not describe all possible side effects. Call your doctor for medical advice about side effects. You may report side effects to FDA at 1-800-FDA-1088. Where should I keep my medicine? Keep out of the reach of children. Store between 15 and 30 degrees C (59 and 86 degrees F). Keep container tightly closed. Throw away any unused medicine  after the expiration date. NOTE: This sheet is a summary. It may not cover all possible information. If you have questions about this medicine, talk to your doctor, pharmacist, or health care provider.  2018 Elsevier/Gold Standard (2013-05-02 14:48:19)

## 2018-02-27 NOTE — Progress Notes (Signed)
Cardiology Office Note:    Date:  02/28/2018   ID:  Brandy Cervantes, DOB February 27, 1946, MRN [id number]  PCP:  Kyla BalzarineMorehart, Jodi, PA-C  Cardiologist:  Norman HerrlichBrian Rockell Faulks, MD    Referring MD: Kyla BalzarineMorehart, Jodi, PA-C    ASSESSMENT:    1. Unstable angina (HCC)   2. Essential hypertension   3. Mixed hyperlipidemia    PLAN:    In order of problems listed above:  1. She is having recurrent symptoms with nocturnal rest angina clinically I think she is unstable angina if her troponin is abnormal should be directed to the emergency room if normal she will abstain from cardiac rehab and undergo coronary angiography as an outpatient she will continue current medications including dual antiplatelet. She will also start imdur. 2. Stable continue current treatment 3. Dyslipidemia stable continue her statin   Next appointment: 4 weeks   Medication Adjustments/Labs and Tests Ordered: Current medicines are reviewed at length with the patient today.  Concerns regarding medicines are outlined above.  Orders Placed This Encounter  Procedures  . DG Chest 2 View  . CBC  . Basic Metabolic Panel (BMET)  . INR/PT  . Troponin I  . EKG 12-Lead   Meds ordered this encounter  Medications  . isosorbide mononitrate (IMDUR) 30 MG 24 hr tablet    Sig: Take 1 tablet (30 mg total) by mouth daily.    Dispense:  30 tablet    Refill:  3    Chief Complaint  Patient presents with  . Chest Pain  . Shortness of Breath  . Gets pale in the face    History of Present Illness:    Brandy Cervantes is a 72 y.o. female with a hx of Troponin normal unstable angina and recent 01/29/18 PCI and DES of proximal LAD 95% stenosis EF 50-55% last seen 02/05/18. She is seen today as a work in for recurrent chest pain.. Compliance with diet, lifestyle and medications: 2 days ago in cardiac rehabilitation she appeared to be struggling the program discontinued her activity and she went home.  She did not feel ill that day but that evening  she is awake in the middle the night with typical angina substernal pressure she took nitroglycerin with some relief 2025 minutes took it again had relief when she awakened in the morning chest pain was there again and she took nitroglycerin with relief.  Since that time she has not felt well she is weak people comment that she appears pale she has had no bleeding or dark stools or pallor and in my examination today.  She has had recurrent very mild fleeting substernal chest pain enough to take a nitroglycerin.  I asked her to abstain from cardiac rehabilitation I think she is at high risk of recurrent ischemia and we will check labs today including a troponin and CBC and plan on doing coronary arteriography either Friday or Monday.  Troponin will be drawn if it is abnormal I will send her to the emergency room presently pain-free no shortness of breath orthopnea edema palpitation or syncope.  She is very meticulous compliant with medications and has not disrupted her dual antiplatelet therapy.  She has no dye allergy.  Options risk and benefits were detailed the patient is in agreement consents to coronary angiography. Alternately she may be having vasospasm with her Raynaud's disease. Past Medical History:  Diagnosis Date  . Anxiety   . Arthritis    "all over" (02/01/2018)  . 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  Bilateral knee pain 10/28/2013  . Bilateral primary osteoarthritis of knee 11/17/2016  . Chronic back pain    "qwhere" (01/29/2018)  . Chronic pain 10/18/2015  . Degenerative disc disease, cervical 10/28/2013  . Degenerative disc disease, lumbar 10/28/2013  . Depression   . Esophageal dysphagia 12/31/2017  . Essential hypertension 10/18/2015  . Exposure to TB    "my mother died when she was [age] of TB"  . GAD (generalized anxiety disorder) 10/18/2015  . GERD (gastroesophageal reflux disease) 12/31/2017  . History of gout    "I've had it occasionally" (11/23/2016)  . Hypertension   .  Insomnia 10/18/2015  . Migraine    "a few/year; more in the last few weeks" (01/29/2018)  . Mixed hyperlipidemia 10/19/2017  . Neck pain 10/28/2013  . Osteoarthritis 10/18/2015  . Osteoporosis 10/18/2015  . Other chest pain 12/31/2017  . Pain syndrome, chronic 10/28/2013  . Raynaud's disease    feet    Past Surgical History:  Procedure Laterality Date  . CARDIAC CATHETERIZATION    . CORONARY ANGIOPLASTY    . CORONARY STENT INTERVENTION N/A 01/29/2018   Procedure: CORONARY STENT INTERVENTION;  Surgeon: Lennette BihariKelly, Thomas A, MD;  [facility];  Service: Cardiovascular;  Laterality: N/A;  . JOINT REPLACEMENT    . LEFT HEART CATH AND CORONARY ANGIOGRAPHY N/A 01/29/2018   Procedure: LEFT HEART CATH AND CORONARY ANGIOGRAPHY;  Surgeon: Lennette BihariKelly, Thomas A, MD;  Location: [facility] INVASIVE CV LAB;  Service: Cardiovascular;  Laterality: N/A;  . MASTOIDECTOMY Right ~ 1955  . TOTAL KNEE ARTHROPLASTY Bilateral 11/22/2016   Procedure: TOTAL KNEE BILATERAL;  Surgeon: Gean Birchwoodowan, Frank, MD;  [facility];  Service: Orthopedics;  Laterality: Bilateral;    Current Medications: Current Meds  Medication Sig  . amLODipine (NORVASC) 10 MG tablet Take 10 mg by mouth daily before breakfast.  . Ascorbic Acid (VITAMIN C) 1000 MG tablet Take 1,000 mg by mouth daily.  Marland Kitchen. aspirin EC 81 MG tablet Take 1 tablet (81 mg total) by mouth daily.  . carvedilol (COREG) 3.125 MG tablet Take 3.125 mg by mouth 2 (two) times daily.   . Cholecalciferol (VITAMIN D3) 1000 units CAPS Take 1,000 Units by mouth daily.  . cyclobenzaprine (FLEXERIL) 5 MG tablet Take 5 mg by mouth daily as needed.  Tery Sanfilippo. Docusate Calcium (STOOL SOFTENER PO) Take 1 capsule by mouth as needed (for constipation).  . fluticasone (FLONASE) 50 MCG/ACT nasal spray Place 2 sprays into both nostrils daily as needed for allergies.   Marland Kitchen. HYDROcodone-acetaminophen (NORCO) 10-325 MG tablet Take 1 tablet by mouth every 6 (six) hours as needed for moderate pain.  Marland Kitchen. morphine (MS  CONTIN) 30 MG 12 hr tablet Take 30 mg by mouth every 12 (twelve) hours.  . Multiple Vitamins-Minerals (SENIOR MULTIVITAMIN PLUS) TABS Take 1 tablet by mouth daily.  . Multiple Vitamins-Minerals (VISION FORMULA EYE HEALTH PO) Take 1 tablet by mouth daily.  . nabumetone (RELAFEN) 500 MG tablet Take 500 mg by mouth 2 (two) times daily as needed for mild pain.  . nitroGLYCERIN (NITROSTAT) 0.4 MG SL tablet Place 1 tablet (0.4 mg total) under the tongue every 5 (five) minutes x 3 doses as needed for chest pain.  Marland Kitchen. OVER THE COUNTER MEDICATION Take 1 tablet by mouth at bedtime as needed (for sleep). Calms Forte  . pantoprazole (PROTONIX) 40 MG tablet Take 1 tablet (40 mg total) by mouth daily.  Marland Kitchen. PARoxetine (PAXIL) 10 MG tablet Take 5 mg by mouth 2 (two) times daily.  .Marland Kitchen  ranitidine (ZANTAC) 150 MG tablet Take 150 mg by mouth 2 (two) times daily as needed (takes with nabumetone or excedrin for acid reducer.).   Marland Kitchen rosuvastatin (CRESTOR) 20 MG tablet Take 1 tablet (20 mg total) by mouth at bedtime.  . ticagrelor (BRILINTA) 90 MG TABS tablet Take 1 tablet (90 mg total) by mouth 2 (two) times daily.  Marland Kitchen triamcinolone cream (KENALOG) 0.1 % Apply 1 application topically 2 (two) times daily as needed (for itching).   . Ubiquinol (QUNOL COQ10/UBIQUINOL/MEGA) 100 MG CAPS Take 1 capsule by mouth daily.  Marland Kitchen zolpidem (AMBIEN) 10 MG tablet Take 5 mg by mouth at bedtime as needed for sleep.     Allergies:   Aspirin; Lisinopril; Topiramate; Alendronate sodium; Celecoxib; Diphenhydramine hcl; Gabapentin; Nsaids; Pravastatin; and Sumatriptan   Social History   Socioeconomic History  . Marital status: Married    Spouse name: Not on file  . Number of children: Not on file  . Years of education: Not on file  . Highest education level: Not on file  Occupational History  . Not on file  Social Needs  . Financial resource strain: Not on file  . Food insecurity:    Worry: Not on file    Inability: Not on file  .  Transportation needs:    Medical: Not on file    Non-medical: Not on file  Tobacco Use  . Smoking status: Never Smoker  . Smokeless tobacco: Never Used  Substance and Sexual Activity  . Alcohol use: No  . Drug use: No  . Sexual activity: Not Currently  Lifestyle  . Physical activity:    Days per week: Not on file    Minutes per session: Not on file  . Stress: Not on file  Relationships  . Social connections:    Talks on phone: Not on file    Gets together: Not on file    Attends religious service: Not on file    Active member of club or organization: Not on file    Attends meetings of clubs or organizations: Not on file    Relationship status: Not on file  Other Topics Concern  . Not on file  Social History Narrative  . Not on file     Family History: The patient's family history includes Heart attack in her father; Heart disease in her brother; Hyperlipidemia in her father; Tuberculosis in her mother. ROS:   Please see the history of present illness.    All other systems reviewed and are negative.  EKGs/Labs/Other Studies Reviewed:    The following studies were reviewed today:  EKG:  EKG ordered today.  The ekg ordered today demonstrates sinus rhythm she has continued precordial T wave inversion if anything improved from her last EKG which never normalized after PCI and stenting of the proximal LAD  Recent Labs: 01/27/2018: Magnesium 2.4; TSH 3.068 01/30/2018: ALT 53 02/27/2018: BUN 21; Creatinine, Ser 0.83; Hemoglobin 14.1; Platelets 304; Potassium 4.4; Sodium 134  Recent Lipid Panel    Component Value Date/Time   CHOL 207 (H) 01/28/2018 0326   TRIG 210 (H) 01/28/2018 0326   HDL 46 01/28/2018 0326   CHOLHDL 4.5 01/28/2018 0326   VLDL 42 (H) 01/28/2018 0326   LDLCALC 119 (H) 01/28/2018 0326    Physical Exam:    VS:  BP 120/74   Pulse 78   Ht 5' 3.5" (1.613 m)   Wt 142 lb 1.9 oz (64.5 kg)   SpO2 94%   BMI 24.78 kg/m  Wt Readings from Last 3  Encounters:  02/27/18 142 lb 1.9 oz (64.5 kg)  02/05/18 142 lb 6.4 oz (64.6 kg)  01/30/18 140 lb 14 oz (63.9 kg)     GEN:  Well nourished, well developed in no acute distress HEENT: Normal NECK: No JVD; No carotid bruits LYMPHATICS: No lymphadenopathy CARDIAC: RRR, no murmurs, rubs, gallops RESPIRATORY:  Clear to auscultation without rales, wheezing or rhonchi  ABDOMEN: Soft, non-tender, non-distended MUSCULOSKELETAL:  No edema; No deformity  SKIN: Warm and dry NEUROLOGIC:  Alert and oriented x 3 PSYCHIATRIC:  Normal affect    Signed, Norman Herrlich, MD  02/28/2018 8:00 AM    [facility]

## 2018-02-28 ENCOUNTER — Telehealth: Payer: Self-pay | Admitting: *Deleted

## 2018-02-28 LAB — BASIC METABOLIC PANEL
BUN/Creatinine Ratio: 25 (ref 12–28)
BUN: 21 mg/dL (ref 8–27)
CALCIUM: 9.3 mg/dL (ref 8.7–10.3)
CHLORIDE: 99 mmol/L (ref 96–106)
CO2: 22 mmol/L (ref 20–29)
Creatinine, Ser: 0.83 mg/dL (ref 0.57–1.00)
GFR calc Af Amer: 81 mL/min/{1.73_m2} (ref >59)
GFR calc non Af Amer: 71 mL/min/{1.73_m2} (ref >59)
GLUCOSE: 105 mg/dL — AB (ref 65–99)
Potassium: 4.4 mmol/L (ref 3.5–5.2)
Sodium: 134 mmol/L (ref 134–144)

## 2018-02-28 LAB — CBC
Hematocrit: 39.7 % (ref 34.0–46.6)
Hemoglobin: 14.1 g/dL (ref 11.1–15.9)
MCH: 30.9 pg (ref 26.6–33.0)
MCHC: 35.5 g/dL (ref 31.5–35.7)
MCV: 87 fL (ref 79–97)
Platelets: 304 10*3/uL (ref 150–450)
RBC: 4.57 x10E6/uL (ref 3.77–5.28)
RDW: 12.8 % (ref 12.3–15.4)
WBC: 9.4 10*3/uL (ref 3.4–10.8)

## 2018-02-28 LAB — PROTIME-INR
INR: 1 (ref 0.8–1.2)
PROTHROMBIN TIME: 10.5 s (ref 9.1–12.0)

## 2018-02-28 LAB — TROPONIN I

## 2018-02-28 NOTE — Telephone Encounter (Signed)
Pt [facility]: Monday March 04, 2018 7:30 AM Ver[facility] and [facility] at: 5:30 AM  No solid food after midnight prior to cath, clear liquids until 5 AM day of procedure. Verified no diabetes medications.  AM meds can be  taken pre-cath with sip of water including: ASA 81 mg Brilinta 90 mg  Confirmed patient has responsible person to drive home post procedure and for 24 hours after you arrive home.: yes

## 2018-03-03 ENCOUNTER — Observation Stay (HOSPITAL_COMMUNITY)
Admission: EM | Admit: 2018-03-03 | Discharge: 2018-03-04 | Disposition: A | Discharge status: Home / Self Care | Payer: Medicare Other | Attending: Cardiology | Admitting: Cardiology

## 2018-03-03 ENCOUNTER — Encounter (HOSPITAL_COMMUNITY): Payer: Self-pay | Admitting: Emergency Medicine

## 2018-03-03 ENCOUNTER — Other Ambulatory Visit: Payer: Self-pay

## 2018-03-03 ENCOUNTER — Other Ambulatory Visit: Payer: Self-pay | Admitting: Physician Assistant

## 2018-03-03 ENCOUNTER — Emergency Department (HOSPITAL_COMMUNITY): Payer: Medicare Other

## 2018-03-03 DIAGNOSIS — R9431 Abnormal electrocardiogram [ECG] [EKG]: Secondary | ICD-10-CM | POA: Diagnosis not present

## 2018-03-03 DIAGNOSIS — E782 Mixed hyperlipidemia: Secondary | ICD-10-CM | POA: Insufficient documentation

## 2018-03-03 DIAGNOSIS — I10 Essential (primary) hypertension: Secondary | ICD-10-CM | POA: Insufficient documentation

## 2018-03-03 DIAGNOSIS — I700 Atherosclerosis of aorta: Secondary | ICD-10-CM | POA: Insufficient documentation

## 2018-03-03 DIAGNOSIS — I200 Unstable angina: Secondary | ICD-10-CM | POA: Diagnosis present

## 2018-03-03 DIAGNOSIS — Z96653 Presence of artificial knee joint, bilateral: Secondary | ICD-10-CM | POA: Insufficient documentation

## 2018-03-03 DIAGNOSIS — G4700 Insomnia, unspecified: Secondary | ICD-10-CM | POA: Insufficient documentation

## 2018-03-03 DIAGNOSIS — Z8249 Family history of ischemic heart disease and other diseases of the circulatory system: Secondary | ICD-10-CM | POA: Insufficient documentation

## 2018-03-03 DIAGNOSIS — M1990 Unspecified osteoarthritis, unspecified site: Secondary | ICD-10-CM | POA: Diagnosis not present

## 2018-03-03 DIAGNOSIS — F419 Anxiety disorder, unspecified: Secondary | ICD-10-CM | POA: Diagnosis not present

## 2018-03-03 DIAGNOSIS — Z9889 Other specified postprocedural states: Secondary | ICD-10-CM | POA: Diagnosis not present

## 2018-03-03 DIAGNOSIS — R079 Chest pain, unspecified: Secondary | ICD-10-CM | POA: Diagnosis not present

## 2018-03-03 DIAGNOSIS — Z955 Presence of coronary angioplasty implant and graft: Secondary | ICD-10-CM | POA: Insufficient documentation

## 2018-03-03 DIAGNOSIS — I25110 Atherosclerotic heart disease of native coronary artery with unstable angina pectoris: Principal | ICD-10-CM | POA: Insufficient documentation

## 2018-03-03 DIAGNOSIS — Z79899 Other long term (current) drug therapy: Secondary | ICD-10-CM | POA: Insufficient documentation

## 2018-03-03 DIAGNOSIS — F329 Major depressive disorder, single episode, unspecified: Secondary | ICD-10-CM | POA: Insufficient documentation

## 2018-03-03 DIAGNOSIS — Z886 Allergy status to analgesic agent status: Secondary | ICD-10-CM | POA: Diagnosis not present

## 2018-03-03 DIAGNOSIS — Z888 Allergy status to other drugs, medicaments and biological substances status: Secondary | ICD-10-CM | POA: Insufficient documentation

## 2018-03-03 DIAGNOSIS — Z7982 Long term (current) use of aspirin: Secondary | ICD-10-CM | POA: Diagnosis not present

## 2018-03-03 DIAGNOSIS — K219 Gastro-esophageal reflux disease without esophagitis: Secondary | ICD-10-CM | POA: Diagnosis not present

## 2018-03-03 HISTORY — DX: Chest pain, unspecified: R07.9

## 2018-03-03 LAB — CBC
HCT: 37.6 % (ref 36.0–46.0)
HCT: 38.1 % (ref 36.0–46.0)
HEMOGLOBIN: 12.2 g/dL (ref 12.0–15.0)
Hemoglobin: 12.3 g/dL (ref 12.0–15.0)
MCH: 29.8 pg (ref 26.0–34.0)
MCH: 29.7 pg (ref 26.0–34.0)
MCHC: 32.4 g/dL (ref 30.0–36.0)
MCHC: 32.3 g/dL (ref 30.0–36.0)
MCV: 91.9 fL (ref 78.0–100.0)
MCV: 92 fL (ref 78.0–100.0)
PLATELETS: 263 10*3/uL (ref 150–400)
PLATELETS: 270 10*3/uL (ref 150–400)
RBC: 4.09 MIL/uL (ref 3.87–5.11)
RBC: 4.14 MIL/uL (ref 3.87–5.11)
RDW: 12.4 % (ref 11.5–15.5)
RDW: 12.4 % (ref 11.5–15.5)
WBC: 8.5 10*3/uL (ref 4.0–10.5)
WBC: 8.9 10*3/uL (ref 4.0–10.5)

## 2018-03-03 LAB — TROPONIN I: Troponin I: <0.03 ng/mL (ref <0.03)

## 2018-03-03 LAB — BASIC METABOLIC PANEL
Anion gap: 7 (ref 5–15)
Anion gap: 9 (ref 5–15)
BUN: 9 mg/dL (ref 8–23)
BUN: 10 mg/dL (ref 8–23)
CALCIUM: 8.5 mg/dL — AB (ref 8.9–10.3)
CALCIUM: 8.7 mg/dL — AB (ref 8.9–10.3)
CO2: 23 mmol/L (ref 22–32)
CO2: 26 mmol/L (ref 22–32)
CREATININE: 0.57 mg/dL (ref 0.44–1.00)
CREATININE: 0.61 mg/dL (ref 0.44–1.00)
Chloride: 110 mmol/L (ref 98–111)
Chloride: 109 mmol/L (ref 98–111)
GFR calc Af Amer: >60 mL/min (ref >60)
GFR calc non Af Amer: >60 mL/min (ref >60)
GFR calc non Af Amer: >60 mL/min (ref >60)
Glucose, Bld: 98 mg/dL (ref 70–99)
Glucose, Bld: 110 mg/dL — ABNORMAL HIGH (ref 70–99)
Potassium: 4.2 mmol/L (ref 3.5–5.1)
Potassium: 4.3 mmol/L (ref 3.5–5.1)
SODIUM: 143 mmol/L (ref 135–145)
Sodium: 141 mmol/L (ref 135–145)

## 2018-03-03 LAB — I-STAT TROPONIN, ED: TROPONIN I, POC: 0 ng/mL (ref 0.00–0.08)

## 2018-03-03 LAB — PROTIME-INR
INR: 0.95
PROTHROMBIN TIME: 12.6 s (ref 11.4–15.2)

## 2018-03-03 LAB — HEPARIN LEVEL (UNFRACTIONATED)
Heparin Unfractionated: 0.44 IU/mL (ref 0.30–0.70)
Heparin Unfractionated: 0.3 IU/mL (ref 0.30–0.70)

## 2018-03-03 MED ORDER — UBIQUINOL 100 MG PO CAPS: 1.0000 | ORAL_CAPSULE | Freq: Every day | ORAL | Status: DC

## 2018-03-03 MED ORDER — SODIUM CHLORIDE 0.9% FLUSH: 3.0000 mL | INTRAVENOUS | Status: DC | PRN

## 2018-03-03 MED ORDER — AMLODIPINE BESYLATE 10 MG PO TABS
10.0000 mg | ORAL_TABLET | Freq: Every day | ORAL | Status: DC
  Administered 2018-03-03 – 2018-03-04 (×2): 10 mg via ORAL
  Filled 2018-03-03 (×2): qty 1

## 2018-03-03 MED ORDER — NITROGLYCERIN 0.4 MG SL SUBL: 0.4000 mg | SUBLINGUAL_TABLET | SUBLINGUAL | Status: DC | PRN

## 2018-03-03 MED ORDER — ONDANSETRON HCL 4 MG/2ML IJ SOLN: 4.0000 mg | Freq: Four times a day (QID) | INTRAMUSCULAR | Status: DC | PRN

## 2018-03-03 MED ORDER — VITAMIN D 1000 UNITS PO TABS
1000.0000 [IU] | ORAL_TABLET | Freq: Every day | ORAL | Status: DC
  Administered 2018-03-03 – 2018-03-04 (×2): 1000 [IU] via ORAL
  Filled 2018-03-03 (×2): qty 1

## 2018-03-03 MED ORDER — ASPIRIN 81 MG PO CHEW
81.0000 mg | CHEWABLE_TABLET | ORAL | Status: AC
  Administered 2018-03-04: 81 mg via ORAL
  Filled 2018-03-03: qty 1

## 2018-03-03 MED ORDER — ROSUVASTATIN CALCIUM 20 MG PO TABS
20.0000 mg | ORAL_TABLET | Freq: Every day | ORAL | Status: DC
  Administered 2018-03-03: 20 mg via ORAL
  Filled 2018-03-03: qty 1

## 2018-03-03 MED ORDER — ACETAMINOPHEN 325 MG PO TABS: 650.0000 mg | ORAL_TABLET | ORAL | Status: DC | PRN

## 2018-03-03 MED ORDER — SODIUM CHLORIDE 0.9% FLUSH: 3.0000 mL | Freq: Two times a day (BID) | INTRAVENOUS | Status: DC

## 2018-03-03 MED ORDER — ASPIRIN EC 81 MG PO TBEC
81.0000 mg | DELAYED_RELEASE_TABLET | Freq: Every day | ORAL | Status: DC
  Administered 2018-03-03 – 2018-03-04 (×2): 81 mg via ORAL
  Filled 2018-03-03 (×2): qty 1

## 2018-03-03 MED ORDER — ASPIRIN EC 81 MG PO TBEC
324.0000 mg | DELAYED_RELEASE_TABLET | Freq: Once | ORAL | Status: AC
  Administered 2018-03-03: 324 mg via ORAL
  Filled 2018-03-03: qty 4

## 2018-03-03 MED ORDER — CARVEDILOL 3.125 MG PO TABS
3.1250 mg | ORAL_TABLET | Freq: Two times a day (BID) | ORAL | Status: DC
  Administered 2018-03-03 – 2018-03-04 (×3): 3.125 mg via ORAL
  Filled 2018-03-03 (×4): qty 1

## 2018-03-03 MED ORDER — VITAMIN C 500 MG PO TABS
1000.0000 mg | ORAL_TABLET | Freq: Every day | ORAL | Status: DC
  Administered 2018-03-03 – 2018-03-04 (×2): 1000 mg via ORAL
  Filled 2018-03-03 (×2): qty 2

## 2018-03-03 MED ORDER — ISOSORBIDE MONONITRATE ER 30 MG PO TB24
30.0000 mg | ORAL_TABLET | Freq: Every day | ORAL | Status: DC
  Administered 2018-03-03: 30 mg via ORAL
  Filled 2018-03-03: qty 1

## 2018-03-03 MED ORDER — NABUMETONE 500 MG PO TABS
500.0000 mg | ORAL_TABLET | Freq: Two times a day (BID) | ORAL | Status: DC | PRN
  Filled 2018-03-03: qty 1

## 2018-03-03 MED ORDER — SODIUM CHLORIDE 0.9 % WEIGHT BASED INFUSION
3.0000 mL/kg/h | INTRAVENOUS | Status: DC
  Administered 2018-03-04: 3 mL/kg/h via INTRAVENOUS

## 2018-03-03 MED ORDER — SODIUM CHLORIDE 0.9 % WEIGHT BASED INFUSION: 1.0000 mL/kg/h | INTRAVENOUS | Status: DC

## 2018-03-03 MED ORDER — FLUTICASONE PROPIONATE 50 MCG/ACT NA SUSP
2.0000 | Freq: Every day | NASAL | Status: DC | PRN
  Filled 2018-03-03: qty 16

## 2018-03-03 MED ORDER — PANTOPRAZOLE SODIUM 40 MG PO TBEC
40.0000 mg | DELAYED_RELEASE_TABLET | Freq: Every day | ORAL | Status: DC
  Administered 2018-03-03 – 2018-03-04 (×2): 40 mg via ORAL
  Filled 2018-03-03 (×2): qty 1

## 2018-03-03 MED ORDER — HYDROCODONE-ACETAMINOPHEN 10-325 MG PO TABS: 1.0000 | ORAL_TABLET | Freq: Four times a day (QID) | ORAL | Status: DC | PRN

## 2018-03-03 MED ORDER — ASPIRIN 81 MG PO CHEW: 324.0000 mg | CHEWABLE_TABLET | Freq: Once | ORAL | Status: DC

## 2018-03-03 MED ORDER — MORPHINE SULFATE ER 15 MG PO TBCR
30.0000 mg | EXTENDED_RELEASE_TABLET | Freq: Two times a day (BID) | ORAL | Status: DC
  Administered 2018-03-03 – 2018-03-04 (×3): 30 mg via ORAL
  Filled 2018-03-03 (×3): qty 2

## 2018-03-03 MED ORDER — HEPARIN BOLUS VIA INFUSION
3000.0000 [IU] | Freq: Once | INTRAVENOUS | Status: AC
  Administered 2018-03-03: 3000 [IU] via INTRAVENOUS
  Filled 2018-03-03: qty 3000

## 2018-03-03 MED ORDER — CYCLOBENZAPRINE HCL 5 MG PO TABS: 5.0000 mg | ORAL_TABLET | Freq: Every day | ORAL | Status: DC | PRN

## 2018-03-03 MED ORDER — PAROXETINE HCL 10 MG PO TABS
5.0000 mg | ORAL_TABLET | Freq: Two times a day (BID) | ORAL | Status: DC
  Administered 2018-03-03 – 2018-03-04 (×3): 5 mg via ORAL
  Filled 2018-03-03 (×4): qty 0.5

## 2018-03-03 MED ORDER — HEPARIN (PORCINE) IN NACL 100-0.45 UNIT/ML-% IJ SOLN
800.0000 [IU]/h | INTRAMUSCULAR | Status: DC
  Administered 2018-03-03 – 2018-03-04 (×2): 800 [IU]/h via INTRAVENOUS
  Filled 2018-03-03 (×3): qty 250

## 2018-03-03 MED ORDER — SODIUM CHLORIDE 0.9 % IV SOLN: 250.0000 mL | INTRAVENOUS | Status: DC | PRN

## 2018-03-03 MED ORDER — TICAGRELOR 90 MG PO TABS
90.0000 mg | ORAL_TABLET | Freq: Two times a day (BID) | ORAL | Status: DC
  Administered 2018-03-03 – 2018-03-04 (×3): 90 mg via ORAL
  Filled 2018-03-03 (×3): qty 1

## 2018-03-03 MED ORDER — ZOLPIDEM TARTRATE 5 MG PO TABS: 5.0000 mg | ORAL_TABLET | Freq: Every evening | ORAL | Status: DC | PRN

## 2018-03-03 NOTE — Progress Notes (Addendum)
ANTICOAGULATION CONSULT NOTE - Initial Consult  Pharmacy Consult for Heparin  Indication: chest pain/ACS  Allergies  Allergen Reactions  . Aspirin Other (See Comments) and Nausea Only    Stomach pain Stomach pain Stomach pain   . Lisinopril Cough  . Topiramate     Other reaction(s): Other (See Comments) depression  . Alendronate Sodium Nausea And Vomiting  . Celecoxib Nausea Only  . Diphenhydramine Hcl Other (See Comments) and Rash  . Gabapentin Other (See Comments) and Nausea Only    Stomach pain  Stomach pain  . Nsaids Nausea Only  . Pravastatin Rash  . Sumatriptan Rash    Vital Signs: Temp: 98 F (36.7 C) (08/18 1201) Temp Source: Oral (08/18 1201) BP: 112/75 (08/18 1201) Pulse Rate: 67 (08/18 1201)  Labs: Recent Labs    03/03/18 0029 03/03/18 0350 03/03/18 1108  HGB 12.2 12.3  --   HCT 37.6 38.1  --   PLT 270 263  --   LABPROT  --  12.6  --   INR  --  0.95  --   HEPARINUNFRC  --   --  0.44  CREATININE 0.61 0.57  --   TROPONINI  --  <0.03 <0.03    Estimated Creatinine Clearance: 57 mL/min (by C-G formula based on SCr of 0.57 mg/dL).   Medical History: Past Medical History:  Diagnosis Date  . Anxiety   . Arthritis    "all over" (02/01/2018)  . 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 Bilateral knee pain 10/28/2013  . Bilateral primary osteoarthritis of knee 11/17/2016  . Chronic back pain    "qwhere" (01/29/2018)  . Chronic pain 10/18/2015  . Degenerative disc disease, cervical 10/28/2013  . Degenerative disc disease, lumbar 10/28/2013  . Depression   . Esophageal dysphagia 12/31/2017  . Essential hypertension 10/18/2015  . Exposure to TB    "my mother died when she was [age] of TB"  . GAD (generalized anxiety disorder) 10/18/2015  . GERD (gastroesophageal reflux disease) 12/31/2017  . History of gout    "I've had it occasionally" (11/23/2016)  . Hypertension   . Insomnia 10/18/2015  . Migraine    "a few/year; more in the last few weeks" (01/29/2018)   . Mixed hyperlipidemia 10/19/2017  . Neck pain 10/28/2013  . Osteoarthritis 10/18/2015  . Osteoporosis 10/18/2015  . Other chest pain 12/31/2017  . Pain syndrome, chronic 10/28/2013  . Raynaud's disease    feet   Assessment: 72 y/o F with recent PCI last month here with CP, plans for cath 8/19. Pharmacy consulted to start heparin for ACS 8/18.  8/18 heparin level therapeutic at 0.44, CBC WNL, no s/sx of bleeding noted  Goal of Therapy:  Heparin level 0.3-0.7 units/ml Monitor platelets by anticoagulation protocol: Yes   Plan:  Continue heparin drip at 800 units/hr Confirmatory heparin level at 1700 Daily CBC/HL, monitor for bleeding  Thank you for involving pharmacy in this patient's care.  Wendelyn Breslowylan Hanna, PharmD PGY1 Pharmacy Resident Phone: [phone] 03/03/2018 12:43 PM

## 2018-03-03 NOTE — ED Triage Notes (Signed)
Pt was awakened from sleep tonight about 2300 with sharp chest pain under breast and then moved to center and changed to pressure. Pt has had increased shob the past few days. No n/v or diaphoresis tonight.  Pt took 3 nitro at home and EMS administered 2 more nitro and pt is now chest pain free. Pt had MI and stent placement on 7/16 at this facility and is due for a fu cath this Monday.

## 2018-03-03 NOTE — H&P (Signed)
History and Physical   Patient ID: Brandy Cervantes, MRN: [id number], DOB: [birth year]   Date of Encounter: 03/03/2018, 2:40 AM  Primary Care Provider: Kyla Balzarine, PA-C Cardiologist: [facility] Electrophysiologist:  NA  Chief Complaint:  CP post PCI  History of Present Illness: Brandy Cervantes is a 72 y.o. female w/ h/o CAD, s/p PCI on 01-29-18 for 95% pLAD lesion, presents tonight w/ recurrent CP. A few days ago, she was in cardiac rehabilitation and appeared to be struggling; the program discontinued her activity and she went home.  She did not feel ill that day but that evening she is awake in the middle the night with typical angina substernal pressure she took nitroglycerin with some relief 2025 minutes took it again had relief when she awakened in the morning chest pain was there again and she took nitroglycerin with relief.  Since that time she has not felt well she is weak people comment that she appears pale she has had no bleeding or dark stools or pallor.  She has had recurrent very mild fleeting substernal chest pain enough to take a nitroglycerin. She is already scheduled for repeat LHC this coming Monday morning 8-19. She had a severe episode of discomfort at 11pm last evening however which prompted her to come to the ED for further evaluation. .  Past Medical History:  Diagnosis Date  . Anxiety   . Arthritis    "all over" (02/01/2018)  . 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 Bilateral knee pain 10/28/2013  . Bilateral primary osteoarthritis of knee 11/17/2016  . Chronic back pain    "qwhere" (01/29/2018)  . Chronic pain 10/18/2015  . Degenerative disc disease, cervical 10/28/2013  . Degenerative disc disease, lumbar 10/28/2013  . Depression   . Esophageal dysphagia 12/31/2017  . Essential hypertension 10/18/2015  . Exposure to TB    "my mother died when she was 61 of TB"  . GAD (generalized anxiety disorder) 10/18/2015  . GERD (gastroesophageal reflux disease) 12/31/2017  .  History of gout    "I've had it occasionally" (11/23/2016)  . Hypertension   . Insomnia 10/18/2015  . Migraine    "a few/year; more in the last few weeks" (01/29/2018)  . Mixed hyperlipidemia 10/19/2017  . Neck pain 10/28/2013  . Osteoarthritis 10/18/2015  . Osteoporosis 10/18/2015  . Other chest pain 12/31/2017  . Pain syndrome, chronic 10/28/2013  . Raynaud's disease    feet    Past Surgical History:  Procedure Laterality Date  . CARDIAC CATHETERIZATION    . CORONARY ANGIOPLASTY    . CORONARY STENT INTERVENTION N/A 01/29/2018   Procedure: CORONARY STENT INTERVENTION;  Surgeon: Lennette Bihari, MD;  Location: [facility] INVASIVE CV LAB;  Service: Cardiovascular;  Laterality: N/A;  . JOINT REPLACEMENT    . LEFT HEART CATH AND CORONARY ANGIOGRAPHY N/A 01/29/2018   Procedure: LEFT HEART CATH AND CORONARY ANGIOGRAPHY;  Surgeon: Lennette Bihari, MD;  Location: [facility] INVASIVE CV LAB;  Service: Cardiovascular;  Laterality: N/A;  . MASTOIDECTOMY Right ~ 1955  . TOTAL KNEE ARTHROPLASTY Bilateral 11/22/2016   Procedure: TOTAL KNEE BILATERAL;  Surgeon: Gean Birchwood, MD;  Location: [facility] OR;  Service: Orthopedics;  Laterality: Bilateral;     Prior to Admission medications   Medication Sig Start Date End Date Taking? Authorizing Provider  amLODipine (NORVASC) 10 MG tablet Take 10 mg by mouth daily before breakfast. 08/30/16   [provider]  Ascorbic Acid (VITAMIN C) 1000 MG tablet Take 1,000 mg by mouth daily.  [provider]  aspirin EC 81 MG tablet Take 1 tablet (81 mg total) by mouth daily. 02/05/18   Baldo DaubMunley, Brian J, MD  carvedilol (COREG) 3.125 MG tablet Take 3.125 mg by mouth 2 (two) times daily.  12/31/17   [provider]  Cholecalciferol (VITAMIN D3) 1000 units CAPS Take 1,000 Units by mouth daily.    [provider]  cyclobenzaprine (FLEXERIL) 5 MG tablet Take 5 mg by mouth daily as needed. 04/03/17   [provider]  Docusate Calcium (STOOL SOFTENER PO) Take 1  capsule by mouth as needed (for constipation).    [provider]  fluticasone (FLONASE) 50 MCG/ACT nasal spray Place 2 sprays into both nostrils daily as needed for allergies.  08/04/13   [provider]  HYDROcodone-acetaminophen (NORCO) 10-325 MG tablet Take 1 tablet by mouth every 6 (six) hours as needed for moderate pain.    [provider]  isosorbide mononitrate (IMDUR) 30 MG 24 hr tablet Take 1 tablet (30 mg total) by mouth daily. 02/27/18   Baldo DaubMunley, Brian J, MD  morphine (MS CONTIN) 30 MG 12 hr tablet Take 30 mg by mouth every 12 (twelve) hours.    [provider]  Multiple Vitamins-Minerals (SENIOR MULTIVITAMIN PLUS) TABS Take 1 tablet by mouth daily.    [provider]  Multiple Vitamins-Minerals (VISION FORMULA EYE HEALTH PO) Take 1 tablet by mouth daily.    [provider]  nabumetone (RELAFEN) 500 MG tablet Take 500 mg by mouth 2 (two) times daily as needed for mild pain.    [provider]  nitroGLYCERIN (NITROSTAT) 0.4 MG SL tablet Place 1 tablet (0.4 mg total) under the tongue every 5 (five) minutes x 3 doses as needed for chest pain. 01/30/18   Duke, Roe RutherfordAngela Nicole, PA  OVER THE COUNTER MEDICATION Take 1 tablet by mouth at bedtime as needed (for sleep). Calms Forte    [provider]  pantoprazole (PROTONIX) 40 MG tablet Take 1 tablet (40 mg total) by mouth daily. 01/30/18   Duke, Roe RutherfordAngela Nicole, PA  PARoxetine (PAXIL) 10 MG tablet Take 5 mg by mouth 2 (two) times daily. 08/22/16   [provider]  ranitidine (ZANTAC) 150 MG tablet Take 150 mg by mouth 2 (two) times daily as needed (takes with nabumetone or excedrin for acid reducer.).     [provider]  rosuvastatin (CRESTOR) 20 MG tablet Take 1 tablet (20 mg total) by mouth at bedtime. 01/30/18 01/30/19  Marcelino Dusteruke, Angela Nicole, PA  ticagrelor (BRILINTA) 90 MG TABS tablet Take 1 tablet (90 mg total) by mouth 2 (two) times daily. 01/30/18   Duke, Roe RutherfordAngela  Nicole, PA  triamcinolone cream (KENALOG) 0.1 % Apply 1 application topically 2 (two) times daily as needed (for itching).  10/26/16   [provider]  Ubiquinol (QUNOL COQ10/UBIQUINOL/MEGA) 100 MG CAPS Take 1 capsule by mouth daily.    [provider]  zolpidem (AMBIEN) 10 MG tablet Take 5 mg by mouth at bedtime as needed for sleep.    [provider]     Allergies: Allergies  Allergen Reactions  . Aspirin Other (See Comments) and Nausea Only    Stomach pain Stomach pain Stomach pain   . Lisinopril Cough  . Topiramate     Other reaction(s): Other (See Comments) depression  . Alendronate Sodium Nausea And Vomiting  . Celecoxib Nausea Only  . Diphenhydramine Hcl Other (See Comments) and Rash  . Gabapentin Other (See Comments) and Nausea Only  Stomach pain  Stomach pain  . Nsaids Nausea Only  . Pravastatin Rash  . Sumatriptan Rash    Social History:  The patient  reports that she has never smoked. She has never used smokeless tobacco. She reports that she does not drink alcohol or use drugs.   Family History:  The patient's family history includes Heart attack in her father; Heart disease in her brother; Hyperlipidemia in her father; Tuberculosis in her mother.   ROS:  Please see the history of present illness.     All other systems reviewed and negative.   Vital Signs: Blood pressure (!) 107/55, pulse 65, temperature 97.6 F (36.4 C), temperature source Oral, resp. rate 15, SpO2 97 %.  PHYSICAL EXAM: General:  Well nourished, well developed, in no acute distress HEENT: normal Lymph: no adenopathy Neck: no JVD Endocrine:  No thryomegaly Vascular: No carotid bruits; DP pulses 2+ bilaterally   Cardiac:  normal S1, S2; RRR; no murmur  Lungs:  clear to auscultation bilaterally, no wheezing, rhonchi or rales  Abd: soft, nontender, no hepatomegaly  Ext: no edema Musculoskeletal:  No deformities, BUE and BLE strength normal and equal Skin: warm  and dry  Neuro:  CNs 2-12 intact, no focal abnormalities noted Psych:  Normal affect   EKG:  01-25-18 NSR with diffuse TWI, possible anterolateral ischemia 03-03-18 NSR with TWI in V1-V3, actually improved overall from prior  Labs:   Lab Results  Component Value Date   WBC 8.9 03/03/2018   HGB 12.2 03/03/2018   HCT 37.6 03/03/2018   MCV 91.9 03/03/2018   PLT 270 03/03/2018   Recent Labs  Lab 03/03/18 0029  NA 143  K 4.2  CL 110  CO2 26  BUN 9  CREATININE 0.61  CALCIUM 8.5*  GLUCOSE 98   No results for input(s): CKTOTAL, CKMB, TROPONINI in the last 72 hours. Troponin [facility](Point of Care Test) Recent Labs    03/03/18 0035  TROPIPOC 0.00    Lab Results  Component Value Date   CHOL 207 (H) 01/28/2018   HDL 46 01/28/2018   LDLCALC 119 (H) 01/28/2018   TRIG 210 (H) 01/28/2018   No results found for: DDIMER  Radiology/Studies:  Dg Chest 2 View  Result Date: 03/03/2018 CLINICAL DATA:  Initial evaluation for acute chest pain. EXAM: CHEST - 2 VIEW COMPARISON:  Prior radiograph from 02/27/2018. FINDINGS: The cardiac and mediastinal silhouettes are stable in size and contour, and remain within normal limits. Aortic atherosclerosis. The lungs are normally inflated. No airspace consolidation, pleural effusion, or pulmonary edema is identified. There is no pneumothorax. No acute osseous abnormality identified. IMPRESSION: 1. No active cardiopulmonary disease. 2. Aortic atherosclerosis. Electronically Signed   By: Rise MuBenjamin  McClintock M.D.   On: 03/03/2018 01:03   Dg Chest 2 View  Result Date: 02/28/2018 CLINICAL DATA:  Possible heart stent surgery in near future. No current complaints. EXAM: CHEST - 2 VIEW COMPARISON:  January 25, 2018 FINDINGS: The heart size and mediastinal contours are within normal limits. Both lungs are clear. The visualized skeletal structures are unremarkable. IMPRESSION: No active cardiopulmonary disease. Electronically Signed   By: Gerome Samavid  Williams III M.D   On:  02/28/2018 08:44   LHC 01-29-18  Prox LAD lesion is 95% stenosed.  Post intervention, there is a 0% residual stenosis.  A stent was successfully placed.  The left ventricular ejection fraction is 50-55% by visual estimate.  The left ventricular systolic function is normal.   Single-vessel coronary obstructive disease with  95% focal proximal LAD stenosis in the region of a small first diagonal vessel takeoff.    Normal left circumflex and dominant RCA.  Low normal global LV function with mild focal distal anterolateral hypocontractility.  Ejection fraction is 50 to 55%.  Successful PCI to the proximal LAD with insertion of a 2.5 x 15 mm Resolute Onyx DES stent postdilated to 2.75 mm with the 95% stenosis being reduced to 0%.  TTE 01-25-18 Nl LV sz, EF 50-55%, small area of distal anterior/anteroseptal AK, grade I DD, nl RV, aortic sclerosis w/o stenosis, mild TR   ASSESSMENT AND PLAN:   1. CP: s/p recent PCI to LAD. Enzymes/EKG OK. Will admit and treat w/ heparin IV gtt in advance of previously planned LHC which will take place on Monday AM.  2. HTN/dyslipidemia: cont home regimen  Signed,  Precious Reel, MD  03/03/2018 2:40 AM

## 2018-03-03 NOTE — ED Notes (Signed)
MD at bedside. 

## 2018-03-03 NOTE — ED Provider Notes (Signed)
[facility] EMERGENCY DEPARTMENT Provider Note   CSN: [id number] Arrival date & time: 03/03/18  0017     History   Chief Complaint Chief Complaint  Patient presents with  . Chest Pain    HPI Brandy Cervantes is a 72 y.o. female.  HPI 72 year old female presenting to ER with left sided and substernal cp that awoke her from sleep. Pt is several weeks out from PCI and DES. Recently seen in cardiology office with recurrent CP and fatigue and felt to have unstable angina with planned heart cath this Monday. Presents with recurrent CP. Some SOB. Resolution of CP on arrival to the ER. Compliant with meds. Nitro x 3. No fevers or cough    Past Medical History:  Diagnosis Date  . Anxiety   . Arthritis    "all over" (02/01/2018)  . 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 Bilateral knee pain 10/28/2013  . Bilateral primary osteoarthritis of knee 11/17/2016  . Chronic back pain    "qwhere" (01/29/2018)  . Chronic pain 10/18/2015  . Degenerative disc disease, cervical 10/28/2013  . Degenerative disc disease, lumbar 10/28/2013  . Depression   . Esophageal dysphagia 12/31/2017  . Essential hypertension 10/18/2015  . Exposure to TB    "my mother died when she was 36 of TB"  . GAD (generalized anxiety disorder) 10/18/2015  . GERD (gastroesophageal reflux disease) 12/31/2017  . History of gout    "I've had it occasionally" (11/23/2016)  . Hypertension   . Insomnia 10/18/2015  . Migraine    "a few/year; more in the last few weeks" (01/29/2018)  . Mixed hyperlipidemia 10/19/2017  . Neck pain 10/28/2013  . Osteoarthritis 10/18/2015  . Osteoporosis 10/18/2015  . Other chest pain 12/31/2017  . Pain syndrome, chronic 10/28/2013  . Raynaud's disease    feet    Patient Active Problem List   Diagnosis Date Noted  . CAD in native artery 02/04/2018  . Raynaud's disease   . Unstable angina (HCC) 01/27/2018  . Abnormal EKG 01/24/2018  . Esophageal dysphagia 12/31/2017  . GERD  (gastroesophageal reflux disease) 12/31/2017  . Other chest pain 12/31/2017  . Mixed hyperlipidemia 10/19/2017  . Bilateral primary osteoarthritis of knee 11/17/2016  . 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 Chronic pain 10/18/2015  . Essential hypertension 10/18/2015  . GAD (generalized anxiety disorder) 10/18/2015  . Insomnia 10/18/2015  . Osteoarthritis 10/18/2015  . Osteoporosis 10/18/2015  . Bilateral knee pain 10/28/2013  . Degenerative disc disease, cervical 10/28/2013  . Degenerative disc disease, lumbar 10/28/2013  . Low back pain 10/28/2013  . Neck pain 10/28/2013  . Pain syndrome, chronic 10/28/2013    Past Surgical History:  Procedure Laterality Date  . CARDIAC CATHETERIZATION    . CORONARY ANGIOPLASTY    . CORONARY STENT INTERVENTION N/A 01/29/2018   Procedure: CORONARY STENT INTERVENTION;  Surgeon: Lennette Bihari, MD;  [facility] INVASIVE CV LAB;  Service: Cardiovascular;  Laterality: N/A;  . JOINT REPLACEMENT    . LEFT HEART CATH AND CORONARY ANGIOGRAPHY N/A 01/29/2018   Procedure: LEFT HEART CATH AND CORONARY ANGIOGRAPHY;  Surgeon: Lennette Bihari, MD;  Location: [facility] INVASIVE CV LAB;  Service: Cardiovascular;  Laterality: N/A;  . MASTOIDECTOMY Right ~ 1955  . TOTAL KNEE ARTHROPLASTY Bilateral 11/22/2016   Procedure: TOTAL KNEE BILATERAL;  Surgeon: Gean Birchwood, MD;  Location: [facility] OR;  Service: Orthopedics;  Laterality: Bilateral;     OB History   None      Home  Medications    Prior to Admission medications   Medication Sig Start Date End Date Taking? Authorizing Provider  amLODipine (NORVASC) 10 MG tablet Take 10 mg by mouth daily before breakfast. 08/30/16   [provider]  Ascorbic Acid (VITAMIN C) 1000 MG tablet Take 1,000 mg by mouth daily.    [provider]  aspirin EC 81 MG tablet Take 1 tablet (81 mg total) by mouth daily. 02/05/18   Baldo DaubMunley, Brian J, MD  carvedilol (COREG) 3.125 MG tablet Take 3.125 mg by mouth 2 (two)  times daily.  12/31/17   [provider]  Cholecalciferol (VITAMIN D3) 1000 units CAPS Take 1,000 Units by mouth daily.    [provider]  cyclobenzaprine (FLEXERIL) 5 MG tablet Take 5 mg by mouth daily as needed. 04/03/17   [provider]  Docusate Calcium (STOOL SOFTENER PO) Take 1 capsule by mouth as needed (for constipation).    [provider]  fluticasone (FLONASE) 50 MCG/ACT nasal spray Place 2 sprays into both nostrils daily as needed for allergies.  08/04/13   [provider]  HYDROcodone-acetaminophen (NORCO) 10-325 MG tablet Take 1 tablet by mouth every 6 (six) hours as needed for moderate pain.    [provider]  isosorbide mononitrate (IMDUR) 30 MG 24 hr tablet Take 1 tablet (30 mg total) by mouth daily. 02/27/18   Baldo DaubMunley, Brian J, MD  morphine (MS CONTIN) 30 MG 12 hr tablet Take 30 mg by mouth every 12 (twelve) hours.    [provider]  Multiple Vitamins-Minerals (SENIOR MULTIVITAMIN PLUS) TABS Take 1 tablet by mouth daily.    [provider]  Multiple Vitamins-Minerals (VISION FORMULA EYE HEALTH PO) Take 1 tablet by mouth daily.    [provider]  nabumetone (RELAFEN) 500 MG tablet Take 500 mg by mouth 2 (two) times daily as needed for mild pain.    [provider]  nitroGLYCERIN (NITROSTAT) 0.4 MG SL tablet Place 1 tablet (0.4 mg total) under the tongue every 5 (five) minutes x 3 doses as needed for chest pain. 01/30/18   Duke, Roe RutherfordAngela Nicole, PA  OVER THE COUNTER MEDICATION Take 1 tablet by mouth at bedtime as needed (for sleep). Calms Forte    [provider]  pantoprazole (PROTONIX) 40 MG tablet Take 1 tablet (40 mg total) by mouth daily. 01/30/18   Duke, Roe RutherfordAngela Nicole, PA  PARoxetine (PAXIL) 10 MG tablet Take 5 mg by mouth 2 (two) times daily. 08/22/16   [provider]  ranitidine (ZANTAC) 150 MG tablet Take 150 mg by mouth 2 (two) times daily as needed (takes with nabumetone or  excedrin for acid reducer.).     [provider]  rosuvastatin (CRESTOR) 20 MG tablet Take 1 tablet (20 mg total) by mouth at bedtime. 01/30/18 01/30/19  Marcelino Dusteruke, Angela Nicole, PA  ticagrelor (BRILINTA) 90 MG TABS tablet Take 1 tablet (90 mg total) by mouth 2 (two) times daily. 01/30/18   Duke, Roe RutherfordAngela Nicole, PA  triamcinolone cream (KENALOG) 0.1 % Apply 1 application topically 2 (two) times daily as needed (for itching).  10/26/16   [provider]  Ubiquinol (QUNOL COQ10/UBIQUINOL/MEGA) 100 MG CAPS Take 1 capsule by mouth daily.    [provider]  zolpidem (AMBIEN) 10 MG tablet Take 5 mg by mouth at bedtime as needed for sleep.    [provider]    Family History Family History  Problem Relation Age of Onset  . Tuberculosis Mother   .  Heart attack Father   . Hyperlipidemia Father   . Heart disease Brother     Social History Social History   Tobacco Use  . Smoking status: Never Smoker  . Smokeless tobacco: Never Used  Substance Use Topics  . Alcohol use: No  . Drug use: No     Allergies   Aspirin; Lisinopril; Topiramate; Alendronate sodium; Celecoxib; Diphenhydramine hcl; Gabapentin; Nsaids; Pravastatin; and Sumatriptan   Review of Systems Review of Systems  All other systems reviewed and are negative.    Physical Exam Updated Vital Signs BP 110/71 (BP Location: Right Arm)   Pulse 63   Temp 97.6 F (36.4 C) (Oral)   Resp 16   SpO2 100%   Physical Exam  Constitutional: She is oriented to person, place, and time. She appears well-developed and well-nourished. No distress.  HENT:  Head: Normocephalic and atraumatic.  Eyes: EOM are normal.  Neck: Normal range of motion.  Cardiovascular: Normal rate, regular rhythm and normal heart sounds.  Pulmonary/Chest: Effort normal and breath sounds normal.  Abdominal: Soft. She exhibits no distension. There is no tenderness.  Musculoskeletal: Normal range of motion.  Neurological: She is  alert and oriented to person, place, and time.  Skin: Skin is warm and dry.  Psychiatric: She has a normal mood and affect. Judgment normal.  Nursing note and vitals reviewed.    ED Treatments / Results  Labs (all labs ordered are listed, but only abnormal results are displayed) Labs Reviewed  BASIC METABOLIC PANEL - Abnormal; Notable for the following components:      Result Value   Calcium 8.5 (*)    All other components within normal limits  BASIC METABOLIC PANEL - Abnormal; Notable for the following components:   Glucose, Bld 110 (*)    Calcium 8.7 (*)    All other components within normal limits  CBC  TROPONIN I  CBC  PROTIME-INR  TROPONIN I  TROPONIN I  HEPARIN LEVEL (UNFRACTIONATED)  I-STAT TROPONIN, ED    EKG EKG Interpretation  Date/Time:  Sunday March 03 2018 00:33:09 EDT Ventricular Rate:  64 PR Interval:    QRS Duration: 96 QT Interval:  403 QTC Calculation: 416 R Axis:   55 Text Interpretation:  Sinus rhythm lateral T waves inverted compared to prior ecg January 30 2018 Confirmed by Azalia Bilisampos, Virgel Haro ([id number]) on 03/03/2018 1:01:14 AM   Radiology Dg Chest 2 View  Result Date: 03/03/2018 CLINICAL DATA:  Initial evaluation for acute chest pain. EXAM: CHEST - 2 VIEW COMPARISON:  Prior radiograph from 02/27/2018. FINDINGS: The cardiac and mediastinal silhouettes are stable in size and contour, and remain within normal limits. Aortic atherosclerosis. The lungs are normally inflated. No airspace consolidation, pleural effusion, or pulmonary edema is identified. There is no pneumothorax. No acute osseous abnormality identified. IMPRESSION: 1. No active cardiopulmonary disease. 2. Aortic atherosclerosis. Electronically Signed   By: Rise MuBenjamin  McClintock M.D.   On: 03/03/2018 01:03    Procedures .Critical Care Performed by: Azalia Bilisampos, Engelbert Sevin, MD Authorized by: Azalia Bilisampos, Estevan Kersh, MD    CRITICAL CARE Performed by: Azalia BilisKevin Latarra Eagleton Total critical care time: 32 minutes Critical  care time was exclusive of separately billable procedures and treating other patients. Critical care was necessary to treat or prevent imminent or life-threatening deterioration. Critical care was time spent personally by me on the following activities: development of treatment plan with patient and/or surrogate as well as nursing, discussions with consultants, evaluation of patient's response to treatment, examination of patient, obtaining history from  patient or surrogate, ordering and performing treatments and interventions, ordering and review of laboratory studies, ordering and review of radiographic studies, pulse oximetry and re-evaluation of patient's condition.   Medications Ordered in ED Medications - No data to display   Initial Impression / Assessment and Plan / ED Course  I have reviewed the triage vital signs and the nursing notes.  Pertinent labs & imaging results that were available during my care of the patient were reviewed by me and considered in my medical decision making (see chart for details).     Unstable angina. Recurrent CP, new T wave inversions laterally.  Patient be admitted to cardiology.  She will likely be watched throughout Sunday with planned heart catheterization on Monday.  Nontoxic and overall well-appearing at this time.  She will need to be placed on telemetry and monitored closely on the floor for recurrent symptoms.  Final Clinical Impressions(s) / ED Diagnoses   Final diagnoses:  Unstable angina [facility])    ED Discharge Orders    None       Azalia Bilis, MD 03/03/18 [phone]

## 2018-03-03 NOTE — Progress Notes (Signed)
ANTICOAGULATION CONSULT NOTE - Initial Consult  Pharmacy Consult for Heparin  Indication: chest pain/ACS  Allergies  Allergen Reactions  . Aspirin Other (See Comments) and Nausea Only    Stomach pain Stomach pain Stomach pain   . Lisinopril Cough  . Topiramate     Other reaction(s): Other (See Comments) depression  . Alendronate Sodium Nausea And Vomiting  . Celecoxib Nausea Only  . Diphenhydramine Hcl Other (See Comments) and Rash  . Gabapentin Other (See Comments) and Nausea Only    Stomach pain  Stomach pain  . Nsaids Nausea Only  . Pravastatin Rash  . Sumatriptan Rash    Vital Signs: Temp: 97.6 F (36.4 C) (08/18 0030) Temp Source: Oral (08/18 0030) BP: 127/51 (08/18 0300) Pulse Rate: 75 (08/18 0300)  Labs: Recent Labs    03/03/18 0029  HGB 12.2  HCT 37.6  PLT 270  CREATININE 0.61    Estimated Creatinine Clearance: 58.2 mL/min (by C-G formula based on SCr of 0.61 mg/dL).   Medical History: Past Medical History:  Diagnosis Date  . Anxiety   . Arthritis    "all over" (02/01/2018)  . 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 Bilateral knee pain 10/28/2013  . Bilateral primary osteoarthritis of knee 11/17/2016  . Chronic back pain    "qwhere" (01/29/2018)  . Chronic pain 10/18/2015  . Degenerative disc disease, cervical 10/28/2013  . Degenerative disc disease, lumbar 10/28/2013  . Depression   . Esophageal dysphagia 12/31/2017  . Essential hypertension 10/18/2015  . Exposure to TB    "my mother died when she was [age] of TB"  . GAD (generalized anxiety disorder) 10/18/2015  . GERD (gastroesophageal reflux disease) 12/31/2017  . History of gout    "I've had it occasionally" (11/23/2016)  . Hypertension   . Insomnia 10/18/2015  . Migraine    "a few/year; more in the last few weeks" (01/29/2018)  . Mixed hyperlipidemia 10/19/2017  . Neck pain 10/28/2013  . Osteoarthritis 10/18/2015  . Osteoporosis 10/18/2015  . Other chest pain 12/31/2017  . Pain syndrome, chronic  10/28/2013  . Raynaud's disease    feet   Assessment: 72 y/o F with recent PCI last month here with CP, plans for cath this Monday, starting heparin, CBC good, renal function good, PTA meds reviewed.   Goal of Therapy:  Heparin level 0.3-0.7 units/ml Monitor platelets by anticoagulation protocol: Yes   Plan:  Heparin 3000 units BOLUS Start heparin drip at 800 units/hr 1200 HL Daily CBC/HL Monitor for bleeding   Abran DukeLedford, Rakesha Dalporto 03/03/2018,3:41 AM

## 2018-03-03 NOTE — Plan of Care (Signed)
  Problem: Education: Goal: Knowledge of General Education information will improve Description Including pain rating scale, medication(s)/side effects and non-pharmacologic comfort measures Outcome: Progressing   Problem: Health Behavior/Discharge Planning: Goal: Ability to manage health-related needs will improve Outcome: Progressing   

## 2018-03-03 NOTE — Progress Notes (Signed)
Patient arrived to unit, ambulated to bed. Patient updated on plan of care, safety precautions in place. Heparin started, EKG done. Cardiac monitoring initiated and verified.

## 2018-03-04 ENCOUNTER — Encounter (HOSPITAL_COMMUNITY): Payer: Self-pay | Admitting: Internal Medicine

## 2018-03-04 ENCOUNTER — Ambulatory Visit (HOSPITAL_COMMUNITY): Admission: RE | Admit: 2018-03-04 | Payer: Medicare Other | Source: Ambulatory Visit | Admitting: Internal Medicine

## 2018-03-04 ENCOUNTER — Encounter (HOSPITAL_COMMUNITY)
Admission: EM | Disposition: A | Discharge status: Home / Self Care | Payer: Self-pay | Source: Home / Self Care | Attending: Emergency Medicine

## 2018-03-04 DIAGNOSIS — I25110 Atherosclerotic heart disease of native coronary artery with unstable angina pectoris: Secondary | ICD-10-CM | POA: Diagnosis not present

## 2018-03-04 DIAGNOSIS — Z955 Presence of coronary angioplasty implant and graft: Secondary | ICD-10-CM | POA: Diagnosis not present

## 2018-03-04 DIAGNOSIS — E782 Mixed hyperlipidemia: Secondary | ICD-10-CM | POA: Diagnosis not present

## 2018-03-04 DIAGNOSIS — I10 Essential (primary) hypertension: Secondary | ICD-10-CM | POA: Diagnosis not present

## 2018-03-04 HISTORY — PX: LEFT HEART CATH AND CORS/GRAFTS ANGIOGRAPHY: CATH118250

## 2018-03-04 LAB — BASIC METABOLIC PANEL
ANION GAP: 8 (ref 5–15)
BUN: 11 mg/dL (ref 8–23)
CALCIUM: 8.8 mg/dL — AB (ref 8.9–10.3)
CO2: 26 mmol/L (ref 22–32)
Chloride: 108 mmol/L (ref 98–111)
Creatinine, Ser: 0.58 mg/dL (ref 0.44–1.00)
GFR calc Af Amer: >60 mL/min (ref >60)
Glucose, Bld: 96 mg/dL (ref 70–99)
POTASSIUM: 3.7 mmol/L (ref 3.5–5.1)
SODIUM: 142 mmol/L (ref 135–145)

## 2018-03-04 LAB — CBC
HCT: 40.7 % (ref 36.0–46.0)
Hemoglobin: 13.1 g/dL (ref 12.0–15.0)
MCH: 29.2 pg (ref 26.0–34.0)
MCHC: 32.2 g/dL (ref 30.0–36.0)
MCV: 90.6 fL (ref 78.0–100.0)
Platelets: 277 10*3/uL (ref 150–400)
RBC: 4.49 MIL/uL (ref 3.87–5.11)
RDW: 12.6 % (ref 11.5–15.5)
WBC: 9.6 10*3/uL (ref 4.0–10.5)

## 2018-03-04 LAB — HEPARIN LEVEL (UNFRACTIONATED): Heparin Unfractionated: 0.24 IU/mL — ABNORMAL LOW (ref 0.30–0.70)

## 2018-03-04 SURGERY — LEFT HEART CATH AND CORS/GRAFTS ANGIOGRAPHY
Anesthesia: LOCAL

## 2018-03-04 MED ORDER — FENTANYL CITRATE (PF) 100 MCG/2ML IJ SOLN
INTRAMUSCULAR | Status: AC
Start: 2018-03-04 — End: ?
  Filled 2018-03-04: qty 2

## 2018-03-04 MED ORDER — LIDOCAINE HCL (PF) 1 % IJ SOLN
INTRAMUSCULAR | Status: AC
  Filled 2018-03-04: qty 30

## 2018-03-04 MED ORDER — ISOSORBIDE MONONITRATE ER 60 MG PO TB24
60.0000 mg | ORAL_TABLET | Freq: Every day | ORAL | Status: DC
  Administered 2018-03-04: 60 mg via ORAL
  Filled 2018-03-04: qty 1

## 2018-03-04 MED ORDER — NITROGLYCERIN 1 MG/10 ML FOR IR/CATH LAB
INTRA_ARTERIAL | Status: AC
  Filled 2018-03-04: qty 10

## 2018-03-04 MED ORDER — HEPARIN SODIUM (PORCINE) 1000 UNIT/ML IJ SOLN
INTRAMUSCULAR | Status: AC
  Filled 2018-03-04: qty 1

## 2018-03-04 MED ORDER — ISOSORBIDE MONONITRATE ER 30 MG PO TB24: 60.0000 mg | ORAL_TABLET | Freq: Every day | ORAL | 3 refills | Status: DC

## 2018-03-04 MED ORDER — IOHEXOL 350 MG/ML SOLN
INTRAVENOUS | Status: DC | PRN
  Administered 2018-03-04: 60 mL via INTRACARDIAC

## 2018-03-04 MED ORDER — SODIUM CHLORIDE 0.9% FLUSH: 3.0000 mL | INTRAVENOUS | Status: DC | PRN

## 2018-03-04 MED ORDER — SODIUM CHLORIDE 0.9 % IV SOLN: 250.0000 mL | INTRAVENOUS | Status: DC | PRN

## 2018-03-04 MED ORDER — SODIUM CHLORIDE 0.9% FLUSH
3.0000 mL | Freq: Two times a day (BID) | INTRAVENOUS | Status: DC
  Administered 2018-03-04: 3 mL via INTRAVENOUS

## 2018-03-04 MED ORDER — SODIUM CHLORIDE 0.9 % IV SOLN
INTRAVENOUS | Status: DC
  Administered 2018-03-04 (×2): via INTRAVENOUS

## 2018-03-04 MED ORDER — MIDAZOLAM HCL 2 MG/2ML IJ SOLN
INTRAMUSCULAR | Status: AC
  Filled 2018-03-04: qty 2

## 2018-03-04 MED ORDER — LIDOCAINE HCL (PF) 1 % IJ SOLN
INTRAMUSCULAR | Status: DC | PRN
  Administered 2018-03-04: 20 mL via SUBCUTANEOUS

## 2018-03-04 MED ORDER — MIDAZOLAM HCL 2 MG/2ML IJ SOLN
INTRAMUSCULAR | Status: DC | PRN
  Administered 2018-03-04: 1 mg via INTRAVENOUS

## 2018-03-04 MED ORDER — FENTANYL CITRATE (PF) 100 MCG/2ML IJ SOLN
INTRAMUSCULAR | Status: DC | PRN
  Administered 2018-03-04: 50 ug via INTRAVENOUS

## 2018-03-04 MED ORDER — HEPARIN (PORCINE) IN NACL 1000-0.9 UT/500ML-% IV SOLN
INTRAVENOUS | Status: DC | PRN
  Administered 2018-03-04 (×2): 500 mL

## 2018-03-04 MED ORDER — NITROGLYCERIN 1 MG/10 ML FOR IR/CATH LAB
INTRA_ARTERIAL | Status: DC | PRN
  Administered 2018-03-04: 200 ug via INTRACORONARY

## 2018-03-04 MED ORDER — HEPARIN (PORCINE) IN NACL 1000-0.9 UT/500ML-% IV SOLN
INTRAVENOUS | Status: AC
  Filled 2018-03-04: qty 500

## 2018-03-04 MED ORDER — ENOXAPARIN SODIUM 40 MG/0.4ML ~~LOC~~ SOLN: 40.0000 mg | SUBCUTANEOUS | Status: DC

## 2018-03-04 SURGICAL SUPPLY — 12 items
CATH INFINITI 5FR MULTPACK ANG (CATHETERS) ×1 IMPLANT
DEVICE CLOSURE MYNXGRIP 5F (Vascular Products) ×1 IMPLANT
GLIDESHEATH SLEND SS 6F .021 (SHEATH) IMPLANT
KIT HEART LEFT (KITS) ×2 IMPLANT
KIT MICROPUNCTURE NIT STIFF (SHEATH) ×1 IMPLANT
PACK CARDIAC CATHETERIZATION (CUSTOM PROCEDURE TRAY) ×2 IMPLANT
SHEATH PINNACLE 5F 10CM (SHEATH) ×1 IMPLANT
SHEATH PROBE COVER 6X72 (BAG) ×1 IMPLANT
SYR MEDRAD MARK V 150ML (SYRINGE) ×2 IMPLANT
TRANSDUCER W/STOPCOCK (MISCELLANEOUS) ×2 IMPLANT
TUBING CIL FLEX 10 FLL-RA (TUBING) ×2 IMPLANT
WIRE EMERALD 3MM-J .035X150CM (WIRE) ×1 IMPLANT

## 2018-03-04 NOTE — Progress Notes (Signed)
Attempted Code 44; patient asleep; CM to follow up. Abelino DerrickB Mackinze [facility] ScottsdaleRN,MHA,BSN [phone]

## 2018-03-04 NOTE — Progress Notes (Signed)
Pt has orders to be discharged. Discharge instructions given and pt has no additional questions at this time. Medication regimen reviewed and pt educated. Pt verbalized understanding and has no additional questions. Telemetry box removed. IV removed and site in good condition. Right femoral site WNL. Pt stable and waiting for transportation.

## 2018-03-04 NOTE — Interval H&P Note (Signed)
History and Physical Interval Note:  03/04/2018 7:29 AM  Merilynn FinlandJanice Brasel  has presented today for cardiac catheterization, with the diagnosis of unstable angina. The various methods of treatment have been discussed with the patient and family. After consideration of risks, benefits and other options for treatment, the patient has consented to  Procedure(s): LEFT HEART CATH AND CORS/GRAFTS ANGIOGRAPHY (N/A) as a surgical intervention .  The patient's history has been reviewed, patient examined, no change in status, stable for surgery.  I have reviewed the patient's chart and labs.  Questions were answered to the patient's satisfaction.    Cath Lab Visit (complete for each Cath Lab visit)  Clinical Evaluation Leading to the Procedure:   ACS: Yes.    Non-ACS:  N/A  Annleigh Knueppel

## 2018-03-04 NOTE — Discharge Summary (Signed)
Discharge Summary    Patient ID: Brandy Cervantes,  MRN: [id number], DOB/AGE: November 02, 1945 72 y.o.  Admit date: 03/03/2018 Discharge date: 03/04/2018  Primary Care Provider: Kyla Balzarine Primary Cardiologist: Dr. Dulce Sellar  Discharge Diagnoses    Active Problems:   Chest pain   Allergies Allergies  Allergen Reactions  . Aspirin Other (See Comments) and Nausea Only    Stomach pain Stomach pain Stomach pain   . Lisinopril Cough  . Topiramate     Other reaction(s): Other (See Comments) depression  . Alendronate Sodium Nausea And Vomiting  . Celecoxib Nausea Only  . Diphenhydramine Hcl Other (See Comments) and Rash  . Gabapentin Other (See Comments) and Nausea Only    Stomach pain  Stomach pain  . Nsaids Nausea Only  . Pravastatin Rash  . Sumatriptan Rash    Diagnostic Studies/Procedures    Cath: 03/04/18  Conclusions: 1. Mild non-obstructive coronary artery disease.  An element of vasospasm is suspected given improvement in some lesions with intracoronary nitroglycerin. 2. Widely patent proximal LAD stent, unchanged in appearance from 01/29/18. 3. Low LVEDP with normal LVEF.  Recommendations: 1. Increase isosorbide mononitrate to 60 mg daily.  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 2. Complete at least 12 months of DAPT from time of PCI last month. 3. Could consider discharge home this afternoon if the patient is able to ambulate without further chest pain.  Recommend uninterrupted dual antiplatelet therapy with Aspirin 81mg  daily and Ticagrelor 90mg  twice daily for a minimum of 12 months (ACS - Class I recommendation).   [name], MD _____________   History of Present Illness     72 y.o. female w/ h/o CAD, s/p PCI on 01-29-18 for 95% pLAD lesion, presented w/ recurrent CP. A few days prior, she was in cardiac rehabilitation and appeared to be struggling; the program discontinued her activity and she went home. She did not  feel ill that day but that evening she was awakened in the middle the night with typical angina substernal pressure she took nitroglycerin with some relief 20-25 minutes took it again had relief when she awakened in the morning chest pain was there again and she took nitroglycerin with relief. Since that time she had not felt well she was weak. She had had recurrent very mild fleeting substernal chest pain enough to take a nitroglycerin. She was admitted and planned for cath the following day.   Hospital Course      Underwent cath noted above with patent pLAD stent, unchanged from 01/29/18. Her Imdur was increased to 60mg  daily and continued on amlodipine 10mg  daily. Continue with DAPT on ASA/Brilinta. No chest pain post cath. Right femoral site stable and able to ambulate without issues.   General: Well developed, well nourished, female appearing in no acute distress. Head: Normocephalic, atraumatic.  Neck: Supple without bruits, JVD. Lungs:  Resp regular and unlabored, CTA. Heart: RRR, S1, S2, no murmur; no rub. Abdomen: Soft, non-tender, non-distended with normoactive bowel sounds. No hepatomegaly. No rebound/guarding. No obvious abdominal masses. Extremities: No clubbing, cyanosis, edema. Distal pedal pulses are 2+ bilaterally. R femoral cath site stable without bruising or hematoma Neuro: Alert and oriented X 3. Moves all extremities spontaneously. Psych: Normal affect.  Brandy Cervantes was seen by Dr. [place] and determined stable for discharge home. Follow up in the office has been arranged. Medications are listed below.   _____________  Discharge Vitals Blood pressure 105/66, pulse 77, temperature 98.6 F (37 C), temperature source  Oral, resp. rate 16, height 5\' 3"  (1.6 m), weight 62.2 kg, SpO2 94 %.  Filed Weights   03/03/18 0433 03/04/18 0414  Weight: 63.3 kg 62.2 kg    Labs & Radiologic Studies    CBC Recent Labs    03/03/18 0350 03/04/18 0622  WBC 8.5 9.6  HGB 12.3 13.1    HCT 38.1 40.7  MCV 92.0 90.6  PLT 263 277   Basic Metabolic Panel Recent Labs    16/04/9607/18/19 0350 03/04/18 0622  NA 141 142  K 4.3 3.7  CL 109 108  CO2 23 26  GLUCOSE 110* 96  BUN 10 11  CREATININE 0.57 0.58  CALCIUM 8.7* 8.8*   Liver Function Tests No results for input(s): AST, ALT, ALKPHOS, BILITOT, PROT, ALBUMIN in the last 72 hours. No results for input(s): LIPASE, AMYLASE in the last 72 hours. Cardiac Enzymes Recent Labs    03/03/18 0350 03/03/18 1108 03/03/18 1652  TROPONINI <0.03 <0.03 <0.03   BNP Invalid input(s): POCBNP D-Dimer No results for input(s): DDIMER in the last 72 hours. Hemoglobin A1C No results for input(s): HGBA1C in the last 72 hours. Fasting Lipid Panel No results for input(s): CHOL, HDL, LDLCALC, TRIG, CHOLHDL, LDLDIRECT in the last 72 hours. Thyroid Function Tests No results for input(s): TSH, T4TOTAL, T3FREE, THYROIDAB in the last 72 hours.  Invalid input(s): FREET3 _____________  Dg Chest 2 View  Result Date: 03/03/2018 CLINICAL DATA:  Initial evaluation for acute chest pain. EXAM: CHEST - 2 VIEW COMPARISON:  Prior radiograph from 02/27/2018. FINDINGS: The cardiac and mediastinal silhouettes are stable in size and contour, and remain within normal limits. Aortic atherosclerosis. The lungs are normally inflated. No airspace consolidation, pleural effusion, or pulmonary edema is identified. There is no pneumothorax. No acute osseous abnormality identified. IMPRESSION: 1. No active cardiopulmonary disease. 2. Aortic atherosclerosis. Electronically Signed   By: Rise MuBenjamin  McClintock M.D.   On: 03/03/2018 01:03   Dg Chest 2 View  Result Date: 02/28/2018 CLINICAL DATA:  Possible heart stent surgery in near future. No current complaints. EXAM: CHEST - 2 VIEW COMPARISON:  January 25, 2018 FINDINGS: The heart size and mediastinal contours are within normal limits. Both lungs are clear. The visualized skeletal structures are unremarkable. IMPRESSION: No  active cardiopulmonary disease. Electronically Signed   By: Gerome Samavid  Williams III M.D   On: 02/28/2018 08:44   Disposition   Pt is being discharged home today in good condition.  Follow-up Plans & Appointments    Follow-up Information    Baldo DaubMunley, Brian J, MD Follow up on 03/21/2018.   Specialty:  Cardiology Why:  at 4pm for your follow up appt.  Contact information: [address] [place] [address] [phone]          Discharge Instructions    Call MD for:  redness, tenderness, or signs of infection (pain, swelling, redness, odor or green/yellow discharge around incision site)   Complete by:  As directed    Diet - low sodium heart healthy   Complete by:  As directed    Discharge instructions   Complete by:  As directed    Radial Site Care Refer to this sheet in the next few weeks. These instructions provide you with information on caring for yourself after your procedure. Your caregiver may also give you more specific instructions. Your treatment has been planned according to current medical practices, but problems sometimes occur. Call your caregiver if you have any problems or questions after your  procedure. HOME CARE INSTRUCTIONS You may shower the day after the procedure.Remove the bandage (dressing) and gently wash the site with plain soap and water.Gently pat the site dry.  Do not apply powder or lotion to the site.  Do not submerge the affected site in water for 3 to 5 days.  Inspect the site at least twice daily.  Do not flex or bend the affected arm for 24 hours.  No lifting over 5 pounds (2.3 kg) for 5 days after your procedure.  Do not drive home if you are discharged the same day of the procedure. Have someone else drive you.  You may drive 24 hours after the procedure unless otherwise instructed by your caregiver.  What to expect: Any bruising will usually fade within 1 to 2 weeks.  Blood that collects in the tissue (hematoma) may be painful  to the touch. It should usually decrease in size and tenderness within 1 to 2 weeks.  SEEK IMMEDIATE MEDICAL CARE IF: You have unusual pain at the radial site.  You have redness, warmth, swelling, or pain at the radial site.  You have drainage (other than a small amount of blood on the dressing).  You have chills.  You have a fever or persistent symptoms for more than 72 hours.  You have a fever and your symptoms suddenly get worse.  Your arm becomes pale, cool, tingly, or numb.  You have heavy bleeding from the site. Hold pressure on the site.   Increase activity slowly   Complete by:  As directed        Discharge Medications     Medication List    STOP taking these medications   nabumetone 500 MG tablet Commonly known as:  RELAFEN     TAKE these medications   amLODipine 10 MG tablet Commonly known as:  NORVASC Take 10 mg by mouth daily before breakfast.   aspirin EC 81 MG tablet Take 1 tablet (81 mg total) by mouth daily.   carvedilol 3.125 MG tablet Commonly known as:  COREG Take 3.125 mg by mouth 2 (two) times daily.   cyclobenzaprine 5 MG tablet Commonly known as:  FLEXERIL Take 5 mg by mouth daily as needed for muscle spasms.   fluticasone 50 MCG/ACT nasal spray Commonly known as:  FLONASE Place 2 sprays into both nostrils daily as needed for allergies.   HYDROcodone-acetaminophen 10-325 MG tablet Commonly known as:  NORCO Take 1 tablet by mouth every 6 (six) hours as needed for moderate pain.   isosorbide mononitrate 30 MG 24 hr tablet Commonly known as:  IMDUR Take 2 tablets (60 mg total) by mouth daily. What changed:  how much to take   morphine 30 MG 12 hr tablet Commonly known as:  MS CONTIN Take 30 mg by mouth every 12 (twelve) hours.   nitroGLYCERIN 0.4 MG SL tablet Commonly known as:  NITROSTAT Place 1 tablet (0.4 mg total) under the tongue every 5 (five) minutes x 3 doses as needed for chest pain.   OVER THE COUNTER MEDICATION Take 1  tablet by mouth at bedtime as needed (for sleep). Calms Forte   pantoprazole 40 MG tablet Commonly known as:  PROTONIX Take 1 tablet (40 mg total) by mouth daily.   PARoxetine 10 MG tablet Commonly known as:  PAXIL Take 5 mg by mouth 2 (two) times daily.   QUNOL COQ10/UBIQUINOL/MEGA 100 MG Caps Generic drug:  Ubiquinol Take 1 capsule by mouth daily.   ranitidine 150 MG tablet  Commonly known as:  ZANTAC Take 150 mg by mouth 2 (two) times daily as needed (takes with nabumetone or excedrin for acid reducer.).   rosuvastatin 20 MG tablet Commonly known as:  CRESTOR Take 1 tablet (20 mg total) by mouth at bedtime.   SENIOR MULTIVITAMIN PLUS Tabs Take 1 tablet by mouth daily.   VISION FORMULA EYE HEALTH PO Take 4 tablets by mouth daily.   STOOL SOFTENER PO Take 1 capsule by mouth daily as needed (for constipation).   ticagrelor 90 MG Tabs tablet Commonly known as:  BRILINTA Take 1 tablet (90 mg total) by mouth 2 (two) times daily.   triamcinolone cream 0.1 % Commonly known as:  KENALOG Apply 1 application topically 2 (two) times daily as needed (for itching).   vitamin C 1000 MG tablet Take 1,000 mg by mouth daily.   Vitamin D3 1000 units Caps Take 1,000 Units by mouth daily.   zolpidem 10 MG tablet Commonly known as:  AMBIEN Take 5 mg by mouth at bedtime as needed for sleep.        Acute coronary syndrome (MI, NSTEMI, STEMI, etc) this admission?: No.     Outstanding Labs/Studies   N/a   Duration of Discharge Encounter   Greater than 30 minutes including physician time.  Signed, Laverda Page NP-C 03/04/2018, 2:34 PM

## 2018-03-04 NOTE — Care Management Obs Status (Signed)
MEDICARE OBSERVATION STATUS NOTIFICATION   Patient Details  Name: Brandy Cervantes MRN: [id number] Date of Birth: 03-06-1946   Medicare Observation Status Notification Given:  Yes    Cherrie DistanceChandler, Silvana Holecek L, RN 03/04/2018, 2:47 PM

## 2018-03-04 NOTE — Care Management CC44 (Signed)
Condition Code 44 Documentation Completed  Patient Details  Name: Brandy Cervantes MRN: [id number] Date of Birth: May 08, 1946   Condition Code 44 given:  Yes Patient signature on Condition Code 44 notice:  Yes Documentation of 2 MD's agreement:  Yes Code 44 added to claim:  Yes    Cherrie Distancehandler, Hetal Proano L, RN 03/04/2018, 2:47 PM

## 2018-03-05 ENCOUNTER — Telehealth: Payer: Self-pay | Admitting: Cardiology

## 2018-03-05 ENCOUNTER — Other Ambulatory Visit: Payer: Self-pay | Admitting: Emergency Medicine

## 2018-03-05 MED ORDER — ISOSORBIDE MONONITRATE ER 30 MG PO TB24: 60.0000 mg | ORAL_TABLET | Freq: Every day | ORAL | 1 refills | Status: DC

## 2018-03-05 NOTE — Telephone Encounter (Signed)
Per patient and patient's husband they are requesting to see Dr. Dulce SellarMunley sooner. Will collaborate with [facility] and Dr. Dulce SellarMunley to see if this is possible. Imdur refilled as well.

## 2018-03-05 NOTE — Telephone Encounter (Signed)
Patient and patient's spouse requesting to be seen sooner. Will collaborate with [facility] to see if this is possible. Refill for Imdur sent in as well, patient aware. Will update patient on status of moving up appointment.

## 2018-03-05 NOTE — Telephone Encounter (Signed)
Patients husband is wanting to go out of town and wants to know if it is going to be safe to leave patient at home withtout him there. She has been having some issues and been back and foerth to the hospital. Please call.

## 2018-03-06 NOTE — Telephone Encounter (Signed)
Patient scheduled to see Dr. Dulce SellarMunley on 03/07/18

## 2018-03-07 ENCOUNTER — Encounter: Payer: Self-pay | Admitting: Cardiology

## 2018-03-07 ENCOUNTER — Ambulatory Visit: Payer: Medicare Other | Admitting: Cardiology

## 2018-03-07 VITALS — BP 82/58 | HR 92 | Ht 63.5 in | Wt 143.8 lb

## 2018-03-07 DIAGNOSIS — E782 Mixed hyperlipidemia: Secondary | ICD-10-CM | POA: Diagnosis not present

## 2018-03-07 DIAGNOSIS — I2510 Atherosclerotic heart disease of native coronary artery without angina pectoris: Secondary | ICD-10-CM

## 2018-03-07 DIAGNOSIS — I10 Essential (primary) hypertension: Secondary | ICD-10-CM | POA: Diagnosis not present

## 2018-03-07 MED ORDER — AMLODIPINE BESYLATE 10 MG PO TABS: 10.0000 mg | ORAL_TABLET | ORAL | 3 refills | Status: DC

## 2018-03-07 MED ORDER — CLOPIDOGREL BISULFATE 75 MG PO TABS: 75.0000 mg | ORAL_TABLET | Freq: Every day | ORAL | 0 refills | Status: DC

## 2018-03-07 MED ORDER — ISOSORBIDE MONONITRATE ER 60 MG PO TB24: 60.0000 mg | ORAL_TABLET | Freq: Every day | ORAL | 2 refills | Status: DC

## 2018-03-07 NOTE — Patient Instructions (Signed)
Medication Instructions:  Your physician has recommended you make the following change in your medication:  STOP carvedilol STOP brilinta on 04/01/18 and START clopidogrel (plavix) 75 mg (1 tablet) daily. Take 2 tablets (150 mg) for the first two days.   DECREASE amlodipine 10 mg every other day   Labwork: None  Testing/Procedures: None  Follow-Up: Your physician wants you to follow-up in: 3 months. You will receive a reminder letter in the mail two months in advance. If you don't receive a letter, please call our office to schedule the follow-up appointment.   If you need a refill on your cardiac medications before your next appointment, please call your pharmacy.   Thank you for choosing [facility]! Mady GemmaCatherine Oaklee Esther, RN [phone]   Clopidogrel tablets What is this medicine? CLOPIDOGREL (kloh PID oh grel) helps to prevent blood clots. This medicine is used to prevent heart attack, stroke, or other vascular events in people who are at high risk. This medicine may be used for other purposes; ask your health care provider or pharmacist if you have questions. COMMON BRAND NAME(S): Plavix What should I tell my health care provider before I take this medicine? They need to know if you have any of the following conditions: -bleeding disorders -bleeding in the brain -having surgery -history of stomach bleeding -an unusual or allergic reaction to clopidogrel, other medicines, foods, dyes, or preservatives -pregnant or trying to get pregnant -breast-feeding How should I use this medicine? Take this medicine by mouth with a glass of water. Follow the directions on the prescription label. You may take this medicine with or without food. If it upsets your stomach, take it with food. Take your medicine at regular intervals. Do not take it more often than directed. Do not stop taking except on your doctor's advice. A special MedGuide will be given to you by the pharmacist with  each prescription and refill. Be sure to read this information carefully each time. Talk to your pediatrician regarding the use of this medicine in children. Special care may be needed. Overdosage: If you think you have taken too much of this medicine contact a poison control center or emergency room at once. NOTE: This medicine is only for you. Do not share this medicine with others. What if I miss a dose? If you miss a dose, take it as soon as you can. If it is almost time for your next dose, take only that dose. Do not take double or extra doses. What may interact with this medicine? Do not take this medicine with the following medications: -dasabuvir; ombitasvir; paritaprevir; ritonavir -defibrotide This medicine may also interact with the following medications: -antiviral medicines for HIV or AIDS -aspirin -certain medicines for depression like citalopram, fluoxetine, fluvoxamine -certain medicines for fungal infections like ketoconazole, fluconazole, voriconazole -certain medicines for seizures like felbamate, oxcarbazepine, phenytoin -certain medicines for stomach problems like cimetidine, omeprazole, esomeprazole -certain medicines that treat or prevent blood clots like warfarin, enoxaparin, dalteparin, apixaban, dabigatran, rivaroxaban, ticlopidine -chloramphenicol -cilostazol -fluvastatin -isoniazid -modafinil -nicardipine -NSAIDS, medicines for pain and inflammation, like ibuprofen or naproxen -quinine -repaglinide -tamoxifen -tolbutamide -topiramate -torsemide This list may not describe all possible interactions. Give your health care provider a list of all the medicines, herbs, non-prescription drugs, or dietary supplements you use. Also tell them if you smoke, drink alcohol, or use illegal drugs. Some items may interact with your medicine. What should I watch for while using this medicine? Visit your doctor or health care professional for regular check ups.  Do not stop  taking your medicine unless your doctor tells you to. Notify your doctor or health care professional and seek emergency treatment if you develop breathing problems; changes in vision; chest pain; severe, sudden headache; pain, swelling, warmth in the leg; trouble speaking; sudden numbness or weakness of the face, arm or leg. These can be signs that your condition has gotten worse. If you are going to have surgery or dental work, tell your doctor or health care professional that you are taking this medicine. Certain genetic factors may reduce the effect of this medicine. Your doctor may use genetic tests to determine treatment. What side effects may I notice from receiving this medicine? Side effects that you should report to your doctor or health care professional as soon as possible: -allergic reactions like skin rash, itching or hives, swelling of the face, lips, or tongue -signs and symptoms of bleeding such as bloody or black, tarry stools; red or dark-brown urine; spitting up blood or brown material that looks like coffee grounds; red spots on the skin; unusual bruising or bleeding from the eye, gums, or nose -signs and symptoms of a blood clot such as breathing problems; changes in vision; chest pain; severe, sudden headache; pain, swelling, warmth in the leg; trouble speaking; sudden numbness or weakness of the face, arm or leg Side effects that usually do not require medical attention (report to your doctor or health care professional if they continue or are bothersome): -constipation -diarrhea -headache -upset stomach This list may not describe all possible side effects. Call your doctor for medical advice about side effects. You may report side effects to FDA at 1-800-FDA-1088. Where should I keep my medicine? Keep out of the reach of children. Store at room temperature of 59 to 86 degrees F (15 to 30 degrees C). Throw away any unused medicine after the expiration date. NOTE: This sheet  is a summary. It may not cover all possible information. If you have questions about this medicine, talk to your doctor, pharmacist, or health care provider.  2018 Elsevier/Gold Standard (2015-04-08 10:00:44)

## 2018-03-07 NOTE — Progress Notes (Signed)
Cardiology Office Note:    Date:  03/07/2018   ID:  Brandy Cervantes, DOB [birth year], MRN [id number]  PCP:  Brandy Cervantes, Jodi, PA-C  Cardiologist:  Brandy HerrlichBrian Darenda Fike, MD    Referring MD: Brandy Cervantes, Jodi, PA-C    ASSESSMENT:    1. CAD in native artery   2. Essential hypertension   3. Mixed hyperlipidemia    PLAN:    In order of problems listed above:  1. Stable recent coronary angiography shows no evidence of restenosis.  In retrospect chest pain was vasospastic associated with rainouts phenomenon with low blood pressure stop her beta-blocker reduce her calcium channel blocker and continue oral nitrates.  She will continue dual antiplatelet therapy for at least 3 months after intervention because of her ongoing shortness of breath and financial issues will transition to clopidogrel at that time 2. Worsen stop carvedilol reduce calcium channel blocker 3. Continue his statin plan to check lipid profile next visit   Next appointment: 3 months   Medication Adjustments/Labs and Tests Ordered: Current medicines are reviewed at length with the patient today.  Concerns regarding medicines are outlined above.  No orders of the defined types were placed in this encounter.  No orders of the defined types were placed in this encounter.   Chief Complaint  Patient presents with  . Follow-up  . Coronary Artery Disease    History of Present Illness:    Brandy Cervantes is a 72 y.o. female with a hx of  Troponin normal unstable angina and recent 01/29/18 PCI and DES of proximal LAD 95% stenosis EF 50-55%  last seen 02/27/2018 with frequent episodes of nonexertional angina felt to be troponin normal unstable angina underwent repeat coronary angiography that showed patent stent in his presumed vasospasm.  She was last seen 02/27/2018. Compliance with diet, lifestyle and medications: Yes  She is a little emotionally traumatized by her last heart catheterization but reassured by the results home blood  pressures been running less than 90 systolic no further chest pain shortness of breath palpitation or syncope Past Medical History:  Diagnosis Date  . Anxiety   . Arthritis    "all over" (02/01/2018)  . 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 Bilateral knee pain 10/28/2013  . Bilateral primary osteoarthritis of knee 11/17/2016  . Chronic back pain    "qwhere" (01/29/2018)  . Chronic pain 10/18/2015  . Degenerative disc disease, cervical 10/28/2013  . Degenerative disc disease, lumbar 10/28/2013  . Depression   . Esophageal dysphagia 12/31/2017  . Essential hypertension 10/18/2015  . Exposure to TB    "my mother died when she was [age] of TB"  . GAD (generalized anxiety disorder) 10/18/2015  . GERD (gastroesophageal reflux disease) 12/31/2017  . History of gout    "I've had it occasionally" (11/23/2016)  . Hypertension   . Insomnia 10/18/2015  . Migraine    "a few/year; more in the last few weeks" (01/29/2018)  . Mixed hyperlipidemia 10/19/2017  . Neck pain 10/28/2013  . Osteoarthritis 10/18/2015  . Osteoporosis 10/18/2015  . Other chest pain 12/31/2017  . Pain syndrome, chronic 10/28/2013  . Raynaud's disease    feet    Past Surgical History:  Procedure Laterality Date  . CARDIAC CATHETERIZATION    . CORONARY ANGIOPLASTY    . CORONARY STENT INTERVENTION N/A 01/29/2018   Procedure: CORONARY STENT INTERVENTION;  Surgeon: Lennette BihariKelly, Thomas A, MD;  [facility];  Service: Cardiovascular;  Laterality: N/A;  . JOINT REPLACEMENT    . LEFT  HEART CATH AND CORONARY ANGIOGRAPHY N/A 01/29/2018   Procedure: LEFT HEART CATH AND CORONARY ANGIOGRAPHY;  Surgeon: Lennette Bihari, MD;  Location: [facility] INVASIVE CV LAB;  Service: Cardiovascular;  Laterality: N/A;  . LEFT HEART CATH AND CORS/GRAFTS ANGIOGRAPHY N/A 03/04/2018   Procedure: LEFT HEART CATH AND CORS/GRAFTS ANGIOGRAPHY;  Surgeon: [name], MD;  Location: [facility] INVASIVE CV LAB;  Service: Cardiovascular;  Laterality: N/A;  . MASTOIDECTOMY  Right ~ 1955  . TOTAL KNEE ARTHROPLASTY Bilateral 11/22/2016   Procedure: TOTAL KNEE BILATERAL;  Surgeon: Gean Birchwood, MD;  Location: [facility] OR;  Service: Orthopedics;  Laterality: Bilateral;    Current Medications: Current Meds  Medication Sig  . amLODipine (NORVASC) 10 MG tablet Take 10 mg by mouth daily before breakfast.  . Ascorbic Acid (VITAMIN C) 1000 MG tablet Take 1,000 mg by mouth daily.  Marland Kitchen aspirin EC 81 MG tablet Take 1 tablet (81 mg total) by mouth daily.  . Cholecalciferol (VITAMIN D3) 1000 units CAPS Take 1,000 Units by mouth daily.  . cyclobenzaprine (FLEXERIL) 5 MG tablet Take 5 mg by mouth daily as needed for muscle spasms.   Tery Sanfilippo Calcium (STOOL SOFTENER PO) Take 1 capsule by mouth daily as needed (for constipation).   . fluticasone (FLONASE) 50 MCG/ACT nasal spray Place 2 sprays into both nostrils daily as needed for allergies.   Marland Kitchen HYDROcodone-acetaminophen (NORCO) 10-325 MG tablet Take 1 tablet by mouth every 6 (six) hours as needed for moderate pain.  . isosorbide mononitrate (IMDUR) 30 MG 24 hr tablet Take 2 tablets (60 mg total) by mouth daily.  Marland Kitchen morphine (MS CONTIN) 30 MG 12 hr tablet Take 30 mg by mouth every 12 (twelve) hours.  . Multiple Vitamins-Minerals (SENIOR MULTIVITAMIN PLUS) TABS Take 1 tablet by mouth daily.  . Multiple Vitamins-Minerals (VISION FORMULA EYE HEALTH PO) Take 4 tablets by mouth daily.   . nitroGLYCERIN (NITROSTAT) 0.4 MG SL tablet Place 1 tablet (0.4 mg total) under the tongue every 5 (five) minutes x 3 doses as needed for chest pain.  Marland Kitchen OVER THE COUNTER MEDICATION Take 1 tablet by mouth at bedtime as needed (for sleep). Calms Forte  . pantoprazole (PROTONIX) 40 MG tablet Take 1 tablet (40 mg total) by mouth daily.  Marland Kitchen PARoxetine (PAXIL) 10 MG tablet Take 5 mg by mouth 2 (two) times daily.  . ranitidine (ZANTAC) 150 MG tablet Take 150 mg by mouth 2 (two) times daily as needed (takes with nabumetone or excedrin for acid reducer.).   Marland Kitchen  rosuvastatin (CRESTOR) 20 MG tablet Take 1 tablet (20 mg total) by mouth at bedtime.  . ticagrelor (BRILINTA) 90 MG TABS tablet Take 1 tablet (90 mg total) by mouth 2 (two) times daily.  Marland Kitchen triamcinolone cream (KENALOG) 0.1 % Apply 1 application topically 2 (two) times daily as needed (for itching).   . Ubiquinol (QUNOL COQ10/UBIQUINOL/MEGA) 100 MG CAPS Take 1 capsule by mouth daily.  Marland Kitchen zolpidem (AMBIEN) 10 MG tablet Take 5 mg by mouth at bedtime as needed for sleep.  . [DISCONTINUED] carvedilol (COREG) 3.125 MG tablet Take 3.125 mg by mouth 2 (two) times daily.      Allergies:   Aspirin; Lisinopril; Topiramate; Alendronate sodium; Celecoxib; Diphenhydramine hcl; Gabapentin; Nsaids; Pravastatin; and Sumatriptan   Social History   Socioeconomic History  . Marital status: Married    Spouse name: Not on file  . Number of children: Not on file  . Years of education: Not on file  . Highest education level: Not on  file  Occupational History  . Not on file  Social Needs  . Financial resource strain: Not on file  . Food insecurity:    Worry: Not on file    Inability: Not on file  . Transportation needs:    Medical: Not on file    Non-medical: Not on file  Tobacco Use  . Smoking status: Never Smoker  . Smokeless tobacco: Never Used  Substance and Sexual Activity  . Alcohol use: No  . Drug use: No  . Sexual activity: Not Currently  Lifestyle  . Physical activity:    Days per week: Not on file    Minutes per session: Not on file  . Stress: Not on file  Relationships  . Social connections:    Talks on phone: Not on file    Gets together: Not on file    Attends religious service: Not on file    Active member of club or organization: Not on file    Attends meetings of clubs or organizations: Not on file    Relationship status: Not on file  Other Topics Concern  . Not on file  Social History Narrative  . Not on file     Family History: The patient's family history includes  Heart attack in her father; Heart disease in her brother; Hyperlipidemia in her father; Tuberculosis in her mother. ROS:   Please see the history of present illness.    All other systems reviewed and are negative.  EKGs/Labs/Other Studies Reviewed:    The following studies were reviewed today:   Recent Labs: 01/27/2018: Magnesium 2.4; TSH 3.068 01/30/2018: ALT 53 03/04/2018: BUN 11; Creatinine, Ser 0.58; Hemoglobin 13.1; Platelets 277; Potassium 3.7; Sodium 142  Recent Lipid Panel    Component Value Date/Time   CHOL 207 (H) 01/28/2018 0326   TRIG 210 (H) 01/28/2018 0326   HDL 46 01/28/2018 0326   CHOLHDL 4.5 01/28/2018 0326   VLDL 42 (H) 01/28/2018 0326   LDLCALC 119 (H) 01/28/2018 0326    Physical Exam:    VS:  BP (!) 82/58 (BP Location: Left Arm, Patient Position: Sitting, Cuff Size: Normal)   Pulse 92   Ht 5' 3.5" (1.613 m)   Wt 143 lb 12.8 oz (65.2 kg)   SpO2 98%   BMI 25.07 kg/m     Wt Readings from Last 3 Encounters:  03/07/18 143 lb 12.8 oz (65.2 kg)  03/04/18 137 lb 3.2 oz (62.2 kg)  02/27/18 142 lb 1.9 oz (64.5 kg)     GEN:  Well nourished, well developed in no acute distress HEENT: Normal NECK: No JVD; No carotid bruits LYMPHATICS: No lymphadenopathy CARDIAC: RRR, no murmurs, rubs, gallops RESPIRATORY:  Clear to auscultation without rales, wheezing or rhonchi  ABDOMEN: Soft, non-tender, non-distended MUSCULOSKELETAL:  No edema; No deformity  SKIN: Warm and dry NEUROLOGIC:  Alert and oriented x 3 PSYCHIATRIC:  Normal affect    Signed, Brandy Herrlich, MD  03/07/2018 2:00 PM    [facility]

## 2018-03-08 ENCOUNTER — Telehealth: Payer: Self-pay | Admitting: Cardiology

## 2018-03-08 NOTE — Telephone Encounter (Signed)
Please advise. Thank you

## 2018-03-08 NOTE — Telephone Encounter (Signed)
Wants to know if she can fly to [place] this week

## 2018-03-09 NOTE — Telephone Encounter (Signed)
Yes  Sit close to bathroom  Do not walk extended distance  on plane as not pressurized to sea level

## 2018-03-11 NOTE — Telephone Encounter (Signed)
Patient aware that she may travel to [place].  She was advised to sit close to the bathroom, and to not walk extended distances on the plane.  Patient agrees to plan and verbalized understanding.

## 2018-03-21 ENCOUNTER — Ambulatory Visit: Payer: Medicare Other | Admitting: Cardiology

## 2018-03-22 ENCOUNTER — Telehealth: Payer: Self-pay | Admitting: Cardiology

## 2018-03-22 NOTE — Telephone Encounter (Signed)
Yes please have her follow the instructions for switching

## 2018-03-22 NOTE — Telephone Encounter (Signed)
Patient was last seen on 03/07/18. At this time, Dr. Dulce Sellar recommended her continue brilinta 90 mg twice daily until 04/01/18 when she will start plavix 75 mg daily and discontinue brilinta. Patient is complaining of worsened shortness of breath since traveling to [place] over a week ago. Patient states that her shortness of breath is on exertion and at rest. She "feels like she can't do anything." Patient wants to know if she can go ahead and stop the brilinta now and start plavix as a side effect of brilinta is shortness of breath. Will consult with Dr. Dulce Sellar and update patient accordingly.

## 2018-03-22 NOTE — Telephone Encounter (Signed)
Is on Brilenta anf is in [place] and can hardly breathe

## 2018-03-22 NOTE — Telephone Encounter (Signed)
Patient informed she can stop Brilinta starting tomorrow 03/23/18. Patient informed starting 03/23/18 to take two 75 mg tablets of plavix in the morning and two 75 mg tablets in the evening on 9/7 and 9/8. Starting 03/25/18 Patient will only take 75 mg daily of plavix. Patient verbally understands and no questions at this time.

## 2018-04-18 ENCOUNTER — Other Ambulatory Visit: Payer: Self-pay | Admitting: Cardiology

## 2018-04-30 ENCOUNTER — Telehealth: Payer: Self-pay | Admitting: Cardiology

## 2018-04-30 NOTE — Telephone Encounter (Signed)
Patient states that she has had a couple of episodes where she gets really pale and her lips turn white. She is not symptomatic when this happens. She is just concerned because this happened right before she had her stent placed on 01/29/2018. Patient reports slight shortness of breath on exertion and lightheadedness occasionally. Denies chest pain. She is scheduled for a follow up appointment on 06/10/18. Will have Dr. Dulce Sellar advise of any further recommendations.

## 2018-04-30 NOTE — Telephone Encounter (Signed)
Patient called and states that she is losing color in her lips and face really fast just like she did before when she had the blockages. Please call patient.

## 2018-04-30 NOTE — Telephone Encounter (Signed)
Is she checking home BP?  Continue meds and keep office appt

## 2018-05-01 NOTE — Telephone Encounter (Signed)
Confirmed that patient has not been monitoring her blood pressure at all within the last month. Advised her to check her blood pressure at the same time each day and record readings. Informed her to bring her blood pressure log to her next appointment for Dr. Dulce Sellar to review. Patient took blood pressure while on the phone: BP 129/74 and HR 82. Patient advised to continue current medications and keep follow up appointment as scheduled on 06/10/18. Patient verbalized understanding. No further questions.

## 2018-06-09 NOTE — Progress Notes (Signed)
Cardiology Office Note:    Date:  06/10/2018   ID:  Brandy Cervantes, DOB 05/24/1946, MRN [id number]  PCP:  Kyla Balzarine, PA-C  Cardiologist:  Norman Herrlich, MD    Referring MD: Kyla Balzarine, PA-C    ASSESSMENT:    1. CAD in native artery   2. Essential hypertension    PLAN:    In order of problems listed above:  1. Stable New York Heart Association class I continue current treatment including her dual antiplatelet high intensity statin oral nitrates and calcium channel blocker with presumed vasospasm after PCI and stent of her LAD 2. Stable continue current treatment including long-acting calcium channel blocker 3. Hyperlipidemia stable check CMP and liver function along with lipid profile for safety and efficacy she will continue her rosuvastatin   Next appointment: 6 months   Medication Adjustments/Labs and Tests Ordered: Current medicines are reviewed at length with the patient today.  Concerns regarding medicines are outlined above.  No orders of the defined types were placed in this encounter.  No orders of the defined types were placed in this encounter.   Chief Complaint  Patient presents with  . Follow-up  . Coronary Artery Disease    History of Present Illness:    Brandy Cervantes is a 72 y.o. female with a hx of  Troponin normal unstable angina and recent 01/29/18 PCI and DES of proximal LAD 95% stenosis EF 50-55% and subsequently  02/27/2018 with frequent episodes of nonexertional angina felt to be troponin normal unstable angina underwent repeat coronary angiography that showed patent stent and treated as presumed coronary artery  vasospasm.  She was  last seen 03/07/18. Compliance with diet, lifestyle and medications: Yes  She is vigorous and active has had no further angina she mildly short of breath when she walks uphill but is not severe or limiting.  No syncope palpitation or edema Past Medical History:  Diagnosis Date  . Anxiety   . Arthritis    "all over" (02/01/2018)  . 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 Bilateral knee pain 10/28/2013  . Bilateral primary osteoarthritis of knee 11/17/2016  . Chronic back pain    "qwhere" (01/29/2018)  . Chronic pain 10/18/2015  . Degenerative disc disease, cervical 10/28/2013  . Degenerative disc disease, lumbar 10/28/2013  . Depression   . Esophageal dysphagia 12/31/2017  . Essential hypertension 10/18/2015  . Exposure to TB    "my mother died when she was 89 of TB"  . GAD (generalized anxiety disorder) 10/18/2015  . GERD (gastroesophageal reflux disease) 12/31/2017  . History of gout    "I've had it occasionally" (11/23/2016)  . Hypertension   . Insomnia 10/18/2015  . Migraine    "a few/year; more in the last few weeks" (01/29/2018)  . Mixed hyperlipidemia 10/19/2017  . Neck pain 10/28/2013  . Osteoarthritis 10/18/2015  . Osteoporosis 10/18/2015  . Other chest pain 12/31/2017  . Pain syndrome, chronic 10/28/2013  . Raynaud's disease    feet    Past Surgical History:  Procedure Laterality Date  . CARDIAC CATHETERIZATION    . CORONARY ANGIOPLASTY    . CORONARY STENT INTERVENTION N/A 01/29/2018   Procedure: CORONARY STENT INTERVENTION;  Surgeon: Lennette Bihari, MD;  Location: [facility] INVASIVE CV LAB;  Service: Cardiovascular;  Laterality: N/A;  . JOINT REPLACEMENT    . LEFT HEART CATH AND CORONARY ANGIOGRAPHY N/A 01/29/2018   Procedure: LEFT HEART CATH AND CORONARY ANGIOGRAPHY;  Surgeon: Lennette Bihari, MD;  Location: [facility] INVASIVE CV  LAB;  Service: Cardiovascular;  Laterality: N/A;  . LEFT HEART CATH AND CORS/GRAFTS ANGIOGRAPHY N/A 03/04/2018   Procedure: LEFT HEART CATH AND CORS/GRAFTS ANGIOGRAPHY;  Surgeon: [name], MD;  Location: [facility] INVASIVE CV LAB;  Service: Cardiovascular;  Laterality: N/A;  . MASTOIDECTOMY Right ~ 1955  . TOTAL KNEE ARTHROPLASTY Bilateral 11/22/2016   Procedure: TOTAL KNEE BILATERAL;  Surgeon: Gean Birchwood, MD;  Location: [facility] OR;  Service: Orthopedics;  Laterality:  Bilateral;    Current Medications: Current Meds  Medication Sig  . amLODipine (NORVASC) 10 MG tablet Take 1 tablet (10 mg total) by mouth every other day.  . Ascorbic Acid (VITAMIN C) 1000 MG tablet Take 1,000 mg by mouth daily.  Marland Kitchen aspirin EC 81 MG tablet Take 1 tablet (81 mg total) by mouth daily.  Marland Kitchen b complex vitamins capsule Take 1 capsule by mouth daily.  . Cholecalciferol (VITAMIN D3) 1000 units CAPS Take 1,000 Units by mouth daily.  . clopidogrel (PLAVIX) 75 MG tablet TAKE 1 TABLET BY MOUTH DAILY  . cyclobenzaprine (FLEXERIL) 5 MG tablet Take 5 mg by mouth daily as needed for muscle spasms.   Tery Sanfilippo Calcium (STOOL SOFTENER PO) Take 1 capsule by mouth daily as needed (for constipation).   . fluticasone (FLONASE) 50 MCG/ACT nasal spray Place 2 sprays into both nostrils daily as needed for allergies.   Marland Kitchen HYDROcodone-acetaminophen (NORCO) 10-325 MG tablet Take 1 tablet by mouth every 6 (six) hours as needed for moderate pain.  . isosorbide mononitrate (IMDUR) 60 MG 24 hr tablet Take 1 tablet (60 mg total) by mouth daily.  Marland Kitchen morphine (MS CONTIN) 30 MG 12 hr tablet Take 30 mg by mouth every 12 (twelve) hours.  . Multiple Vitamins-Minerals (SENIOR MULTIVITAMIN PLUS) TABS Take 1 tablet by mouth daily.  . nitroGLYCERIN (NITROSTAT) 0.4 MG SL tablet Place 1 tablet (0.4 mg total) under the tongue every 5 (five) minutes x 3 doses as needed for chest pain.  Marland Kitchen OVER THE COUNTER MEDICATION Take 1 tablet by mouth at bedtime as needed (for sleep). Calms Forte  . pantoprazole (PROTONIX) 40 MG tablet Take 1 tablet (40 mg total) by mouth daily.  Marland Kitchen PARoxetine (PAXIL) 10 MG tablet Take 5 mg by mouth 2 (two) times daily.  . ranitidine (ZANTAC) 150 MG tablet Take 150 mg by mouth 2 (two) times daily as needed (takes with nabumetone or excedrin for acid reducer.).   Marland Kitchen rosuvastatin (CRESTOR) 20 MG tablet Take 1 tablet (20 mg total) by mouth at bedtime.  . triamcinolone cream (KENALOG) 0.1 % Apply 1 application  topically 2 (two) times daily as needed (for itching).   . zolpidem (AMBIEN) 10 MG tablet Take 5 mg by mouth at bedtime as needed for sleep.     Allergies:   Aspirin; Lisinopril; Topiramate; Alendronate sodium; Celecoxib; Diphenhydramine hcl; Gabapentin; Nsaids; Pravastatin; and Sumatriptan   Social History   Socioeconomic History  . Marital status: Married    Spouse name: Not on file  . Number of children: Not on file  . Years of education: Not on file  . Highest education level: Not on file  Occupational History  . Not on file  Social Needs  . Financial resource strain: Not on file  . Food insecurity:    Worry: Not on file    Inability: Not on file  . Transportation needs:    Medical: Not on file    Non-medical: Not on file  Tobacco Use  . Smoking status: Never Smoker  .  Smokeless tobacco: Never Used  Substance and Sexual Activity  . Alcohol use: No  . Drug use: No  . Sexual activity: Not Currently  Lifestyle  . Physical activity:    Days per week: Not on file    Minutes per session: Not on file  . Stress: Not on file  Relationships  . Social connections:    Talks on phone: Not on file    Gets together: Not on file    Attends religious service: Not on file    Active member of club or organization: Not on file    Attends meetings of clubs or organizations: Not on file    Relationship status: Not on file  Other Topics Concern  . Not on file  Social History Narrative  . Not on file     Family History: The patient's family history includes Heart attack in her father; Heart disease in her brother; Hyperlipidemia in her father; Tuberculosis in her mother. ROS:   Please see the history of present illness.    All other systems reviewed and are negative.  EKGs/Labs/Other Studies Reviewed:    The following studies were reviewed  Recent Labs: 01/27/2018: Magnesium 2.4; TSH 3.068 01/30/2018: ALT 53 03/04/2018: BUN 11; Creatinine, Ser 0.58; Hemoglobin 13.1; Platelets  277; Potassium 3.7; Sodium 142  Recent Lipid Panel    Component Value Date/Time   CHOL 207 (H) 01/28/2018 0326   TRIG 210 (H) 01/28/2018 0326   HDL 46 01/28/2018 0326   CHOLHDL 4.5 01/28/2018 0326   VLDL 42 (H) 01/28/2018 0326   LDLCALC 119 (H) 01/28/2018 0326    Physical Exam:    VS:  BP 120/80 (BP Location: Right Arm, Patient Position: Sitting, Cuff Size: Normal)   Pulse 86   Ht 5\' 4"  (1.626 m)   Wt 144 lb (65.3 kg)   SpO2 96%   BMI 24.72 kg/m     Wt Readings from Last 3 Encounters:  06/10/18 144 lb (65.3 kg)  03/07/18 143 lb 12.8 oz (65.2 kg)  03/04/18 137 lb 3.2 oz (62.2 kg)     GEN:  Well nourished, well developed in no acute distress HEENT: Normal NECK: No JVD; No carotid bruits LYMPHATICS: No lymphadenopathy CARDIAC: RRR, no murmurs, rubs, gallops RESPIRATORY:  Clear to auscultation without rales, wheezing or rhonchi  ABDOMEN: Soft, non-tender, non-distended MUSCULOSKELETAL:  No edema; No deformity  SKIN: Warm and dry NEUROLOGIC:  Alert and oriented x 3 PSYCHIATRIC:  Normal affect    Signed, Norman HerrlichBrian Elisa Sorlie, MD  06/10/2018 2:43 PM    [facility]

## 2018-06-10 ENCOUNTER — Ambulatory Visit (INDEPENDENT_AMBULATORY_CARE_PROVIDER_SITE_OTHER): Payer: Medicare Other | Admitting: Cardiology

## 2018-06-10 VITALS — BP 120/80 | HR 86 | Ht 64.0 in | Wt 144.0 lb

## 2018-06-10 DIAGNOSIS — E782 Mixed hyperlipidemia: Secondary | ICD-10-CM | POA: Diagnosis not present

## 2018-06-10 DIAGNOSIS — I10 Essential (primary) hypertension: Secondary | ICD-10-CM

## 2018-06-10 DIAGNOSIS — I2510 Atherosclerotic heart disease of native coronary artery without angina pectoris: Secondary | ICD-10-CM

## 2018-06-10 NOTE — Patient Instructions (Signed)
Medication Instructions:  Your physician recommends that you continue on your current medications as directed. Please refer to the Current Medication list given to you today.  If you need a refill on your cardiac medications before your next appointment, please call your pharmacy.   Lab work: Your physician recommends that you return for lab work today: CMP, lipid panel.   If you have labs (blood work) drawn today and your tests are completely normal, you will receive your results only by: Marland Kitchen. MyChart Message (if you have MyChart) OR . A paper copy in the mail If you have any lab test that is abnormal or we need to change your treatment, we will call you to review the results.  Testing/Procedures: None  Follow-Up: At [facility], you and your health needs are our priority.  As part of our continuing mission to provide you with exceptional heart care, we have created designated Provider Care Teams.  These Care Teams include your primary Cardiologist (physician) and Advanced Practice Providers (APPs -  Physician Assistants and Nurse Practitioners) who all work together to provide you with the care you need, when you need it. You will need a follow up appointment in 3 months.

## 2018-06-11 LAB — LIPID PANEL
CHOL/HDL RATIO: 2.6 ratio (ref 0.0–4.4)
CHOLESTEROL TOTAL: 141 mg/dL (ref 100–199)
HDL: 54 mg/dL (ref >39)
LDL CALC: 60 mg/dL (ref 0–99)
Triglycerides: 134 mg/dL (ref 0–149)
VLDL CHOLESTEROL CAL: 27 mg/dL (ref 5–40)

## 2018-06-11 LAB — COMPREHENSIVE METABOLIC PANEL
ALT: 26 IU/L (ref 0–32)
AST: 38 IU/L (ref 0–40)
Albumin/Globulin Ratio: 1.6 (ref 1.2–2.2)
Albumin: 4.3 g/dL (ref 3.5–4.8)
Alkaline Phosphatase: 107 IU/L (ref 39–117)
BUN/Creatinine Ratio: 14 (ref 12–28)
BUN: 11 mg/dL (ref 8–27)
Bilirubin Total: 0.2 mg/dL (ref 0.0–1.2)
CALCIUM: 10.1 mg/dL (ref 8.7–10.3)
CO2: 26 mmol/L (ref 20–29)
CREATININE: 0.76 mg/dL (ref 0.57–1.00)
Chloride: 100 mmol/L (ref 96–106)
GFR calc Af Amer: 91 mL/min/{1.73_m2} (ref >59)
GFR calc non Af Amer: 79 mL/min/{1.73_m2} (ref >59)
GLUCOSE: 85 mg/dL (ref 65–99)
Globulin, Total: 2.7 g/dL (ref 1.5–4.5)
Potassium: 4.8 mmol/L (ref 3.5–5.2)
SODIUM: 141 mmol/L (ref 134–144)
Total Protein: 7 g/dL (ref 6.0–8.5)

## 2018-07-18 ENCOUNTER — Telehealth: Payer: Self-pay | Admitting: Cardiology

## 2018-07-18 NOTE — Telephone Encounter (Signed)
Patient has been having SOB and some chest pain and she took 2  nitro yesterday and she had to wait a while afterr taking the 2nd one for iut to help her. Please cal patient. She has been having th SOB for about 2 months and its starting to come more often.

## 2018-07-19 NOTE — Telephone Encounter (Signed)
See me Brandy Cervantes next week

## 2018-07-19 NOTE — Telephone Encounter (Signed)
Patient is having increased shortness of breath on exertion and at rest. Patient reports having an episode of stabbing left sided chest pain that radiated down her left arm on 07/17/2018. She took two nitroglycerin tablets and the pain resolved but patient is concerned. She states she has taken a total of six nitroglycerin since October 2019. Patient has not been keeping track of her vital signs but verified that she is taking all her medications as prescribed. Will have Dr. Dulce SellarMunley advise.

## 2018-07-19 NOTE — Telephone Encounter (Signed)
Patient has been scheduled to see Dr. Dulce Sellar in the [facility] office on Wednesday, 07/24/2018 at 3:20 pm. Patient verbalized understanding. No further questions.

## 2018-07-23 NOTE — Progress Notes (Signed)
Cardiology Office Note:    Date:  07/24/2018   ID:  Brandy Cervantes, DOB 1945/09/23, MRN [id number]  PCP:  Blase Mess, PA-C  Cardiologist:  Shirlee More, MD    Referring MD: Blase Mess, PA-C    ASSESSMENT:    1. Hypotension, unspecified hypotension type   2. Shortness of breath on exertion   3. Essential hypertension   4. CAD in native artery    PLAN:    In order of problems listed above:  1. She is complaining been diffusely weak home blood pressures been less than 076 systolic less than 226 today this may be something as simple as symptomatic hypotension and will discontinue her calcium channel blocker and follow serial blood pressures at home for response.  The differential diagnosis of her diffuse weakness includes side effects from her analgesic and hypnotics and I cautioned her with these medications.  For completeness check liver function and CBC 2. She is short of breath with activity no overt evidence of heart failure she complains of wheezing will check chest x-ray and proBNP level. 3. Worsened hypotensive will discontinue calcium channel blocker follow serial blood pressures at home 4. Stable continue medical treatment no indication of acute ischemic syndrome.   Next appointment: 1 month   Medication Adjustments/Labs and Tests Ordered: Current medicines are reviewed at length with the patient today.  Concerns regarding medicines are outlined above.  Orders Placed This Encounter  Procedures  . DG Chest 2 View  . Comp Met (CMET)  . CBC  . Pro b natriuretic peptide (BNP)  . EKG 12-Lead   No orders of the defined types were placed in this encounter.   Chief Complaint  Patient presents with  . Follow-up  . Coronary Artery Disease    History of Present Illness:    Brandy Cervantes is a 73 y.o. 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  She complains of shortness of breath intermittent wheezing is no history of congestive heart failure and no edema neck vein distention rales or S3.  On first glance she appears quite pale of them to check a CBC comprehensive panel proBNP and chest x-ray I think the problem here is symptomatic hypotension with amlodipine that will be discontinued.  She has had one episode of chest pain relieved with 2 nitroglycerin Past Medical History:  Diagnosis Date  . Anxiety   . Arthritis    "all over" (02/01/2018)  . 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 Bilateral knee pain 10/28/2013  . Bilateral primary osteoarthritis of knee 11/17/2016  . Chronic back pain    "qwhere" (01/29/2018)  . Chronic pain 10/18/2015  . Degenerative disc disease, cervical 10/28/2013  . Degenerative disc disease, lumbar 10/28/2013  . Depression   . Esophageal dysphagia 12/31/2017  . Essential hypertension 10/18/2015  . Exposure to TB    "my mother died when she was 13 of TB"  .  GAD (generalized anxiety disorder) 10/18/2015  . GERD (gastroesophageal reflux disease) 12/31/2017  . History of gout    "I've had it occasionally" (11/23/2016)  . Hypertension   . Insomnia 10/18/2015  . Migraine    "a few/year; more in the last few weeks" (01/29/2018)  . Mixed hyperlipidemia 10/19/2017  . Neck pain 10/28/2013  . Osteoarthritis 10/18/2015  . Osteoporosis 10/18/2015  . Other chest pain 12/31/2017  . Pain syndrome, chronic 10/28/2013  . Raynaud's disease     feet    Past Surgical History:  Procedure Laterality Date  . CARDIAC CATHETERIZATION    . CORONARY ANGIOPLASTY    . CORONARY STENT INTERVENTION N/A 01/29/2018   Procedure: CORONARY STENT INTERVENTION;  Surgeon: Troy Sine, MD;  Location: [facility];  Service: Cardiovascular;  Laterality: N/A;  . JOINT REPLACEMENT    . LEFT HEART CATH AND CORONARY ANGIOGRAPHY N/A 01/29/2018   Procedure: LEFT HEART CATH AND CORONARY ANGIOGRAPHY;  Surgeon: Troy Sine, MD;  Location: [facility];  Service: Cardiovascular;  Laterality: N/A;  . LEFT HEART CATH AND CORS/GRAFTS ANGIOGRAPHY N/A 03/04/2018   Procedure: LEFT HEART CATH AND CORS/GRAFTS ANGIOGRAPHY;  Surgeon: Nelva Bush, MD;  Location: [facility];  Service: Cardiovascular;  Laterality: N/A;  . MASTOIDECTOMY Right ~ 1955  . TOTAL KNEE ARTHROPLASTY Bilateral 11/22/2016   Procedure: TOTAL KNEE BILATERAL;  Surgeon: Frederik Pear, MD;  Location: [facility];  Service: Orthopedics;  Laterality: Bilateral;    Current Medications: Current Meds  Medication Sig  . Ascorbic Acid (VITAMIN C) 1000 MG tablet Take 1,000 mg by mouth daily.  Marland Kitchen aspirin EC 81 MG tablet Take 1 tablet (81 mg total) by mouth daily.  Marland Kitchen b complex vitamins capsule Take 1 capsule by mouth daily.  . Cholecalciferol (VITAMIN D3) 1000 units CAPS Take 1,000 Units by mouth daily.  . clopidogrel (PLAVIX) 75 MG tablet TAKE 1 TABLET BY MOUTH DAILY  . cyclobenzaprine (FLEXERIL) 5 MG tablet Take 5 mg by mouth daily as needed for muscle spasms.   Mariane Baumgarten Calcium (STOOL SOFTENER PO) Take 1 capsule by mouth daily as needed (for constipation).   . fluticasone (FLONASE) 50 MCG/ACT nasal spray Place 2 sprays into both nostrils daily as needed for allergies.   Marland Kitchen HYDROcodone-acetaminophen (NORCO) 10-325 MG tablet Take 1 tablet by mouth every 6 (six) hours as needed for moderate pain.  . isosorbide mononitrate (IMDUR) 60 MG 24 hr tablet Take 1 tablet (60 mg total) by mouth  daily.  Marland Kitchen morphine (MS CONTIN) 30 MG 12 hr tablet Take 30 mg by mouth every 12 (twelve) hours.  . Multiple Vitamins-Minerals (SENIOR MULTIVITAMIN PLUS) TABS Take 1 tablet by mouth daily.  . nitroGLYCERIN (NITROSTAT) 0.4 MG SL tablet Place 1 tablet (0.4 mg total) under the tongue every 5 (five) minutes x 3 doses as needed for chest pain.  Marland Kitchen OVER THE COUNTER MEDICATION Take 1 tablet by mouth at bedtime as needed (for sleep). Calms Forte  . pantoprazole (PROTONIX) 40 MG tablet Take 1 tablet (40 mg total) by mouth daily.  Marland Kitchen PARoxetine (PAXIL) 10 MG tablet Take 5 mg by mouth 2 (two) times daily.  . ranitidine (ZANTAC) 150 MG tablet Take 150 mg by mouth 2 (two) times daily as needed (takes with nabumetone or excedrin for acid reducer.).   Marland Kitchen rosuvastatin (CRESTOR) 20 MG tablet Take 1 tablet (20 mg total) by mouth at bedtime.  . triamcinolone cream (KENALOG) 0.1 % Apply 1 application topically 2 (  two) times daily as needed (for itching).   . zolpidem (AMBIEN) 10 MG tablet Take 5 mg by mouth at bedtime as needed for sleep.  . [DISCONTINUED] amLODipine (NORVASC) 10 MG tablet Take 1 tablet (10 mg total) by mouth every other day. (Patient taking differently: Take 5 mg by mouth every other day. )     Allergies:   Aspirin; Lisinopril; Topiramate; Alendronate sodium; Celecoxib; Diphenhydramine hcl; Gabapentin; Nsaids; Pravastatin; and Sumatriptan   Social History   Socioeconomic History  . Marital status: Married    Spouse name: Not on file  . Number of children: Not on file  . Years of education: Not on file  . Highest education level: Not on file  Occupational History  . Not on file  Social Needs  . Financial resource strain: Not on file  . Food insecurity:    Worry: Not on file    Inability: Not on file  . Transportation needs:    Medical: Not on file    Non-medical: Not on file  Tobacco Use  . Smoking status: Never Smoker  . Smokeless tobacco: Never Used  Substance and Sexual Activity  .  Alcohol use: No  . Drug use: No  . Sexual activity: Not Currently  Lifestyle  . Physical activity:    Days per week: Not on file    Minutes per session: Not on file  . Stress: Not on file  Relationships  . Social connections:    Talks on phone: Not on file    Gets together: Not on file    Attends religious service: Not on file    Active member of club or organization: Not on file    Attends meetings of clubs or organizations: Not on file    Relationship status: Not on file  Other Topics Concern  . Not on file  Social History Narrative  . Not on file     Family History: The patient's family history includes Heart attack in her father; Heart disease in her brother; Hyperlipidemia in her father; Tuberculosis in her mother. ROS:   Please see the history of present illness.    All other systems reviewed and are negative.  EKGs/Labs/Other Studies Reviewed:    The following studies were reviewed today:  EKG:  EKG ordered today.  The ekg ordered today demonstrates sinus rhythm and normal  Recent Labs: 01/27/2018: Magnesium 2.4; TSH 3.068 03/04/2018: Hemoglobin 13.1; Platelets 277 06/10/2018: ALT 26; BUN 11; Creatinine, Ser 0.76; Potassium 4.8; Sodium 141  Recent Lipid Panel    Component Value Date/Time   CHOL 141 06/10/2018 1456   TRIG 134 06/10/2018 1456   HDL 54 06/10/2018 1456   CHOLHDL 2.6 06/10/2018 1456   CHOLHDL 4.5 01/28/2018 0326   VLDL 42 (H) 01/28/2018 0326   LDLCALC 60 06/10/2018 1456    Physical Exam:    VS:  BP 96/60 (BP Location: Right Arm, Patient Position: Sitting, Cuff Size: Normal)   Pulse 81   Ht 5' 3.75" (1.619 m)   Wt 143 lb (64.9 kg)   SpO2 94%   BMI 24.74 kg/m     Wt Readings from Last 3 Encounters:  07/24/18 143 lb (64.9 kg)  06/10/18 144 lb (65.3 kg)  03/07/18 143 lb 12.8 oz (65.2 kg)     GEN: She appears quite pale and weak well nourished, well developed in no acute distress HEENT: Normal NECK: No JVD; No carotid bruits LYMPHATICS:  No lymphadenopathy CARDIAC: RRR, no murmurs, rubs, gallops RESPIRATORY:  Clear to auscultation without rales, wheezing or rhonchi  ABDOMEN: Soft, non-tender, non-distended MUSCULOSKELETAL:  No edema; No deformity  SKIN: Warm and dry NEUROLOGIC:  Alert and oriented x 3 PSYCHIATRIC:  Normal affect    Signed, Shirlee More, MD  07/24/2018 5:33 PM    [place]

## 2018-07-23 NOTE — Research (Signed)
CADFEM Informed Consent   Subject Name: Brandy Cervantes  Subject met inclusion and exclusion criteria.  The informed consent form, study requirements and expectations were reviewed with the subject and questions and concerns were addressed prior to the signing of the consent form.  The subject verbalized understanding of the trail requirements.  The subject agreed to participate in the CADFEM trial and signed the informed consent.  The informed consent was obtained prior to performance of any protocol-specific procedures for the subject.  A copy of the signed informed consent was given to the subject and a copy was placed in the subject's medical record.  [name] 01/29/2018 9:40 AM

## 2018-07-24 ENCOUNTER — Encounter: Payer: Self-pay | Admitting: Cardiology

## 2018-07-24 ENCOUNTER — Ambulatory Visit (INDEPENDENT_AMBULATORY_CARE_PROVIDER_SITE_OTHER): Payer: Medicare Other | Admitting: Cardiology

## 2018-07-24 ENCOUNTER — Encounter: Payer: Self-pay | Admitting: *Deleted

## 2018-07-24 VITALS — BP 96/60 | HR 81 | Ht 63.75 in | Wt 143.0 lb

## 2018-07-24 DIAGNOSIS — R0602 Shortness of breath: Secondary | ICD-10-CM

## 2018-07-24 DIAGNOSIS — I959 Hypotension, unspecified: Secondary | ICD-10-CM

## 2018-07-24 DIAGNOSIS — I2510 Atherosclerotic heart disease of native coronary artery without angina pectoris: Secondary | ICD-10-CM | POA: Diagnosis not present

## 2018-07-24 DIAGNOSIS — I10 Essential (primary) hypertension: Secondary | ICD-10-CM | POA: Diagnosis not present

## 2018-07-24 HISTORY — DX: Hypotension, unspecified: I95.9

## 2018-07-24 HISTORY — DX: Shortness of breath: R06.02

## 2018-07-24 NOTE — Patient Instructions (Signed)
Medication Instructions:  Your physician has recommended you make the following change in your medication:   STOP amlodipine  If you need a refill on your cardiac medications before your next appointment, please call your pharmacy.   Lab work: Your physician recommends that you return for lab work today: CBC, CMP, ProBNP.   If you have labs (blood work) drawn today and your tests are completely normal, you will receive your results only by: Marland Kitchen MyChart Message (if you have MyChart) OR . A paper copy in the mail If you have any lab test that is abnormal or we need to change your treatment, we will call you to review the results.  Testing/Procedures: You had an EKG today.   A chest x-ray takes a picture of the organs and structures inside the chest, including the heart, lungs, and blood vessels. This test can show several things, including, whether the heart is enlarges; whether fluid is building up in the lungs; and whether pacemaker / defibrillator leads are still in place.  Follow-Up: At [facility], you and your health needs are our priority.  As part of our continuing mission to provide you with exceptional heart care, we have created designated Provider Care Teams.  These Care Teams include your primary Cardiologist (physician) and Advanced Practice Providers (APPs -  Physician Assistants and Nurse Practitioners) who all work together to provide you with the care you need, when you need it. . You will need a follow up appointment in 6 weeks.    Any Other Special Instructions Will Be Listed Below (If Applicable). **Please continue checking your blood pressure at home

## 2018-07-25 ENCOUNTER — Telehealth: Payer: Self-pay

## 2018-07-25 DIAGNOSIS — R0602 Shortness of breath: Secondary | ICD-10-CM

## 2018-07-25 DIAGNOSIS — N289 Disorder of kidney and ureter, unspecified: Secondary | ICD-10-CM

## 2018-07-25 DIAGNOSIS — I10 Essential (primary) hypertension: Secondary | ICD-10-CM

## 2018-07-25 LAB — CBC
Hematocrit: 42.5 % (ref 34.0–46.6)
Hemoglobin: 14.4 g/dL (ref 11.1–15.9)
MCH: 29.8 pg (ref 26.6–33.0)
MCHC: 33.9 g/dL (ref 31.5–35.7)
MCV: 88 fL (ref 79–97)
Platelets: 300 10*3/uL (ref 150–450)
RBC: 4.83 x10E6/uL (ref 3.77–5.28)
RDW: 12.5 % (ref 11.7–15.4)
WBC: 9.5 10*3/uL (ref 3.4–10.8)

## 2018-07-25 LAB — COMPREHENSIVE METABOLIC PANEL
A/G RATIO: 1.5 (ref 1.2–2.2)
ALK PHOS: 104 IU/L (ref 39–117)
ALT: 29 IU/L (ref 0–32)
AST: 38 IU/L (ref 0–40)
Albumin: 4.1 g/dL (ref 3.5–4.8)
BILIRUBIN TOTAL: 0.2 mg/dL (ref 0.0–1.2)
BUN/Creatinine Ratio: 15 (ref 12–28)
BUN: 18 mg/dL (ref 8–27)
CHLORIDE: 101 mmol/L (ref 96–106)
CO2: 27 mmol/L (ref 20–29)
Calcium: 9.5 mg/dL (ref 8.7–10.3)
Creatinine, Ser: 1.21 mg/dL — ABNORMAL HIGH (ref 0.57–1.00)
GFR calc non Af Amer: 45 mL/min/{1.73_m2} — ABNORMAL LOW (ref >59)
GFR, EST AFRICAN AMERICAN: 52 mL/min/{1.73_m2} — AB (ref >59)
Globulin, Total: 2.8 g/dL (ref 1.5–4.5)
Glucose: 120 mg/dL — ABNORMAL HIGH (ref 65–99)
POTASSIUM: 4.5 mmol/L (ref 3.5–5.2)
Sodium: 142 mmol/L (ref 134–144)
TOTAL PROTEIN: 6.9 g/dL (ref 6.0–8.5)

## 2018-07-25 LAB — PRO B NATRIURETIC PEPTIDE: NT-PRO BNP: 187 pg/mL (ref 0–301)

## 2018-07-25 NOTE — Telephone Encounter (Signed)
-----   Message from Baldo Daub, MD sent at 07/25/2018  8:02 AM EST ----- Normal or stable result  The only abnormality is a mild decrease in kidney function may be related to low blood pressure let us recheck her in 2 weeks and asked her to drink an extra 1 to 2 L of fluid a day

## 2018-07-25 NOTE — Telephone Encounter (Signed)
Patient advised of mild decrease in kidney function per Dr Dulce Sellar.  Patient advised to increased her fluid intake everdyay by 1 to 2 liters.  Patient instructed to recheck BMP in 2 weeks.  Patient agreed to plan and verbalized understanding.

## 2018-07-26 ENCOUNTER — Telehealth: Payer: Self-pay

## 2018-07-26 ENCOUNTER — Telehealth: Payer: Self-pay | Admitting: Cardiology

## 2018-07-26 NOTE — Telephone Encounter (Signed)
Left message on patients home phone and cell phone to return call regarding lab results.

## 2018-07-26 NOTE — Telephone Encounter (Signed)
Lab results explained to patient in detail.  Patients questions were answered to her satisfaction.  Patient agrees to plan and verbalized understanding.

## 2018-07-26 NOTE — Telephone Encounter (Signed)
-----   Message from Baldo DaubBrian J Munley, MD sent at 07/26/2018 10:43 AM EST ----- CXR is normal

## 2018-07-26 NOTE — Telephone Encounter (Signed)
Patient called abot blood test that she has a questions about kidney function. Pleae call her.Marland Kitchen

## 2018-07-26 NOTE — Telephone Encounter (Signed)
Patient notified of normal chest xray results from [facility] per Dr Dulce Sellar.  Patient verbalized understanding.

## 2018-09-05 ENCOUNTER — Ambulatory Visit (INDEPENDENT_AMBULATORY_CARE_PROVIDER_SITE_OTHER): Payer: Medicare Other | Admitting: Cardiology

## 2018-09-05 ENCOUNTER — Encounter: Payer: Self-pay | Admitting: Cardiology

## 2018-09-05 VITALS — BP 114/78 | HR 83 | Ht 63.75 in | Wt 143.0 lb

## 2018-09-05 DIAGNOSIS — I952 Hypotension due to drugs: Secondary | ICD-10-CM

## 2018-09-05 DIAGNOSIS — E782 Mixed hyperlipidemia: Secondary | ICD-10-CM

## 2018-09-05 DIAGNOSIS — I10 Essential (primary) hypertension: Secondary | ICD-10-CM | POA: Diagnosis not present

## 2018-09-05 DIAGNOSIS — I2510 Atherosclerotic heart disease of native coronary artery without angina pectoris: Secondary | ICD-10-CM | POA: Diagnosis not present

## 2018-09-05 NOTE — Patient Instructions (Signed)
Medication Instructions:  Your physician recommends that you continue on your current medications as directed. Please refer to the Current Medication list given to you today.  If you need a refill on your cardiac medications before your next appointment, please call your pharmacy.   Lab work: None.  If you have labs (blood work) drawn today and your tests are completely normal, you will receive your results only by: . MyChart Message (if you have MyChart) OR . A paper copy in the mail If you have any lab test that is abnormal or we need to change your treatment, we will call you to review the results.  Testing/Procedures: None.   Follow-Up: At [facility], you and your health needs are our priority.  As part of our continuing mission to provide you with exceptional heart care, we have created designated Provider Care Teams.  These Care Teams include your primary Cardiologist (physician) and Advanced Practice Providers (APPs -  Physician Assistants and Nurse Practitioners) who all work together to provide you with the care you need, when you need it. You will need a follow up appointment in 6 months.     

## 2018-09-05 NOTE — Progress Notes (Signed)
Cardiology Office Note:    Date:  09/05/2018   ID:  Brandy Cervantes, DOB 1946-07-04, MRN [id number]  PCP:  Kyla Balzarine, PA-C  Cardiologist:  Norman Herrlich, MD    Referring MD: Kyla Balzarine, PA-C    ASSESSMENT:    1. CAD in native artery   2. Essential hypertension   3. Hypotension due to drugs   4. Mixed hyperlipidemia    PLAN:    In order of problems listed above:  1. Stable New York Heart Association class I continue treatment including dual antiplatelet and lipid-lowering. 2. Improved blood pressure in target not having hypotension 3. Resolved 4. Stable lipids are at target recent lipid profile 06/10/2018 with an LDL of 60.   Next appointment: 6 months   Medication Adjustments/Labs and Tests Ordered: Current medicines are reviewed at length with the patient today.  Concerns regarding medicines are outlined above.  No orders of the defined types were placed in this encounter.  No orders of the defined types were placed in this encounter.   Chief Complaint  Patient presents with  . Follow-up  . Hypotension    History of Present Illness:    Brandy Cervantes is a 73 y.o. 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 Compliance with diet, lifestyle and medications: Yes  She is improved no further symptomatic hypotension gradually increasing her activities and is able to garden has had no angina edema orthopnea palpitation or syncope. Past Medical History:  Diagnosis Date  . Anxiety   . Arthritis    "all over" (02/01/2018)  . 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 Bilateral knee pain 10/28/2013  . Bilateral primary osteoarthritis of  knee 11/17/2016  . Chronic back pain    "qwhere" (01/29/2018)  . Chronic pain 10/18/2015  . Degenerative disc disease, cervical 10/28/2013  . Degenerative disc disease, lumbar 10/28/2013  . Depression   . Esophageal dysphagia 12/31/2017  . Essential hypertension 10/18/2015  . Exposure to TB    "my mother died when she was 60 of TB"  . GAD (generalized anxiety disorder) 10/18/2015  . GERD (gastroesophageal reflux disease) 12/31/2017  . History of gout    "I've had it occasionally" (11/23/2016)  . Hypertension   . Insomnia 10/18/2015  . Migraine    "a few/year; more in the last few weeks" (01/29/2018)  . Mixed hyperlipidemia 10/19/2017  . Neck pain 10/28/2013  . Osteoarthritis 10/18/2015  . Osteoporosis 10/18/2015  . Other chest pain 12/31/2017  . Pain syndrome, chronic 10/28/2013  . Raynaud's disease    feet    Past Surgical History:  Procedure Laterality Date  . CARDIAC CATHETERIZATION    . CORONARY ANGIOPLASTY    . CORONARY STENT INTERVENTION N/A 01/29/2018   Procedure: CORONARY STENT INTERVENTION;  Surgeon: Lennette Bihari, MD;  Location: [facility] INVASIVE CV LAB;  Service: Cardiovascular;  Laterality: N/A;  . JOINT REPLACEMENT    . LEFT HEART CATH AND CORONARY ANGIOGRAPHY N/A 01/29/2018   Procedure: LEFT HEART CATH AND CORONARY ANGIOGRAPHY;  Surgeon: Lennette Bihari, MD;  Location: [facility] INVASIVE CV LAB;  Service: Cardiovascular;  Laterality: N/A;  . LEFT HEART CATH AND CORS/GRAFTS ANGIOGRAPHY N/A 03/04/2018   Procedure: LEFT HEART  CATH AND CORS/GRAFTS ANGIOGRAPHY;  Surgeon: Yvonne KendallEnd, Christopher, MD;  Location: [facility] INVASIVE CV LAB;  Service: Cardiovascular;  Laterality: N/A;  . MASTOIDECTOMY Right ~ 1955  . TOTAL KNEE ARTHROPLASTY Bilateral 11/22/2016   Procedure: TOTAL KNEE BILATERAL;  Surgeon: Gean Birchwoodowan, Frank, MD;  [facility];  Service: Orthopedics;  Laterality: Bilateral;    Current Medications: Current Meds  Medication Sig  . Ascorbic Acid (VITAMIN C) 1000 MG tablet Take 1,000 mg by mouth daily.  Marland Kitchen.  aspirin EC 81 MG tablet Take 1 tablet (81 mg total) by mouth daily.  Marland Kitchen. b complex vitamins capsule Take 1 capsule by mouth daily.  . Cholecalciferol (VITAMIN D3) 1000 units CAPS Take 1,000 Units by mouth daily.  . clopidogrel (PLAVIX) 75 MG tablet TAKE 1 TABLET BY MOUTH DAILY  . cyclobenzaprine (FLEXERIL) 5 MG tablet Take 5 mg by mouth daily as needed for muscle spasms.   Tery Sanfilippo. Docusate Calcium (STOOL SOFTENER PO) Take 1 capsule by mouth daily as needed (for constipation).   . fluticasone (FLONASE) 50 MCG/ACT nasal spray Place 2 sprays into both nostrils daily as needed for allergies.   Marland Kitchen. HYDROcodone-acetaminophen (NORCO) 10-325 MG tablet Take 1 tablet by mouth every 6 (six) hours as needed for moderate pain.  . isosorbide mononitrate (IMDUR) 60 MG 24 hr tablet Take 1 tablet (60 mg total) by mouth daily.  Marland Kitchen. morphine (MS CONTIN) 30 MG 12 hr tablet Take 30 mg by mouth every 12 (twelve) hours.  . Multiple Vitamins-Minerals (SENIOR MULTIVITAMIN PLUS) TABS Take 1 tablet by mouth daily.  . nabumetone (RELAFEN) 500 MG tablet Take 500 mg by mouth daily as needed.  . nitroGLYCERIN (NITROSTAT) 0.4 MG SL tablet Place 1 tablet (0.4 mg total) under the tongue every 5 (five) minutes x 3 doses as needed for chest pain.  Marland Kitchen. OVER THE COUNTER MEDICATION Take 1 tablet by mouth at bedtime as needed (for sleep). Calms Forte  . pantoprazole (PROTONIX) 40 MG tablet Take 1 tablet (40 mg total) by mouth daily.  Marland Kitchen. PARoxetine (PAXIL) 10 MG tablet Take 5 mg by mouth 2 (two) times daily.  . ranitidine (ZANTAC) 150 MG tablet Take 150 mg by mouth 2 (two) times daily as needed (takes with nabumetone or excedrin for acid reducer.).   Marland Kitchen. rosuvastatin (CRESTOR) 20 MG tablet Take 1 tablet (20 mg total) by mouth at bedtime.  . triamcinolone cream (KENALOG) 0.1 % Apply 1 application topically 2 (two) times daily as needed (for itching).   . zolpidem (AMBIEN) 10 MG tablet Take 5 mg by mouth at bedtime as needed for sleep.     Allergies:    Aspirin; Lisinopril; Topiramate; Alendronate sodium; Celecoxib; Diphenhydramine hcl; Gabapentin; Nsaids; Pravastatin; and Sumatriptan   Social History   Socioeconomic History  . Marital status: Married    Spouse name: Not on file  . Number of children: Not on file  . Years of education: Not on file  . Highest education level: Not on file  Occupational History  . Not on file  Social Needs  . Financial resource strain: Not on file  . Food insecurity:    Worry: Not on file    Inability: Not on file  . Transportation needs:    Medical: Not on file    Non-medical: Not on file  Tobacco Use  . Smoking status: Never Smoker  . Smokeless tobacco: Never Used  Substance and Sexual Activity  . Alcohol use: No  . Drug use: No  . Sexual activity: Not Currently  Lifestyle  . Physical activity:    Days per week: Not on file    Minutes per session: Not on file  . Stress: Not on file  Relationships  . Social connections:    Talks on phone: Not on file    Gets together: Not on file    Attends religious service: Not on file    Active member of club or organization: Not on file    Attends meetings of clubs or organizations: Not on file    Relationship status: Not on file  Other Topics Concern  . Not on file  Social History Narrative  . Not on file     Family History: The patient's family history includes Heart attack in her father; Heart disease in her brother; Hyperlipidemia in her father; Tuberculosis in her mother. ROS:   Please see the history of present illness.    All other systems reviewed and are negative.  EKGs/Labs/Other Studies Reviewed:    The following studies were reviewed today:  Recent Labs: 01/27/2018: Magnesium 2.4; TSH 3.068 07/24/2018: ALT 29; BUN 18; Creatinine, Ser 1.21; Hemoglobin 14.4; NT-Pro BNP 187; Platelets 300; Potassium 4.5; Sodium 142  Recent Lipid Panel    Component Value Date/Time   CHOL 141 06/10/2018 1456   TRIG 134 06/10/2018 1456   HDL 54  06/10/2018 1456   CHOLHDL 2.6 06/10/2018 1456   CHOLHDL 4.5 01/28/2018 0326   VLDL 42 (H) 01/28/2018 0326   LDLCALC 60 06/10/2018 1456    Physical Exam:    VS:  BP 114/78 (BP Location: Left Arm, Patient Position: Sitting, Cuff Size: Normal)   Pulse 83   Ht 5' 3.75" (1.619 m)   Wt 143 lb (64.9 kg)   SpO2 95%   BMI 24.74 kg/m     Wt Readings from Last 3 Encounters:  09/05/18 143 lb (64.9 kg)  07/24/18 143 lb (64.9 kg)  06/10/18 144 lb (65.3 kg)     GEN:  Well nourished, well developed in no acute distress HEENT: Normal NECK: No JVD; No carotid bruits LYMPHATICS: No lymphadenopathy CARDIAC: RRR, no murmurs, rubs, gallops RESPIRATORY:  Clear to auscultation without rales, wheezing or rhonchi  ABDOMEN: Soft, non-tender, non-distended MUSCULOSKELETAL:  No edema; No deformity  SKIN: Warm and dry NEUROLOGIC:  Alert and oriented x 3 PSYCHIATRIC:  Normal affect    Signed, Norman Herrlich, MD  09/05/2018 11:40 AM    [facility]

## 2018-09-06 ENCOUNTER — Ambulatory Visit: Payer: Medicare Other | Admitting: Cardiology

## 2018-09-20 DIAGNOSIS — L03031 Cellulitis of right toe: Secondary | ICD-10-CM | POA: Insufficient documentation

## 2018-09-20 DIAGNOSIS — L84 Corns and callosities: Secondary | ICD-10-CM

## 2018-09-20 HISTORY — DX: Corns and callosities: L84

## 2018-09-20 HISTORY — DX: Cellulitis of right toe: L03.031

## 2018-10-01 DIAGNOSIS — I160 Hypertensive urgency: Secondary | ICD-10-CM | POA: Diagnosis not present

## 2018-10-01 DIAGNOSIS — J189 Pneumonia, unspecified organism: Secondary | ICD-10-CM | POA: Diagnosis not present

## 2018-10-01 DIAGNOSIS — E876 Hypokalemia: Secondary | ICD-10-CM | POA: Diagnosis not present

## 2018-10-01 DIAGNOSIS — Z79899 Other long term (current) drug therapy: Secondary | ICD-10-CM | POA: Diagnosis not present

## 2018-11-21 DIAGNOSIS — W57XXXA Bitten or stung by nonvenomous insect and other nonvenomous arthropods, initial encounter: Secondary | ICD-10-CM

## 2018-11-21 HISTORY — DX: Bitten or stung by nonvenomous insect and other nonvenomous arthropods, initial encounter: W57.XXXA

## 2018-12-17 ENCOUNTER — Other Ambulatory Visit: Payer: Self-pay | Admitting: Cardiology

## 2018-12-30 ENCOUNTER — Telehealth: Payer: Self-pay | Admitting: Cardiology

## 2018-12-30 NOTE — Telephone Encounter (Signed)
Patient is feeling really bad and having severe weakness and SOB and it is getting worse and she is wanting to be seen sooner and she wants to come to [facility], this has happened before and Dr Bettina Gavia had to out her in the hospital, Please call her.Brandy Cervantes

## 2018-12-30 NOTE — Telephone Encounter (Signed)
Reports that she has been going downhill for several months. She has been getting exhausted and having severe shortness of breath doing routine daily living activities at home. She gets exhausted getting up and pouring her coffee, bending over and petting the dog or walking and talking at the same time make her gasp for air.    Also reports occasional constant left anterior ribcage pain sometimes lasting hours which gets as high as 4/10 in intensity. She is not sure if the pain is from her heart or her ribs. She experiences some edema in her fingers but nowhere else. Does not weigh herself of check VS routinely. VS checked on call 133/78, HR 58, wt #138.    She has nitroglycerin and wears it around her neck but has not taken it. Reviewed instructions on  how to take nitroglycerin with patient.     Has office visit scheduled in 02/2019. Would like to see Dr. Bettina Gavia sooner and in the office if possible.    pls advise, tx

## 2018-12-30 NOTE — Telephone Encounter (Signed)
Please have her schedule for an office visit this week we will need to do an EKG in the office

## 2018-12-30 NOTE — Telephone Encounter (Signed)
Phoned patient, scheduled office visit for Thursday 01/02/19 in [facility].

## 2019-01-01 NOTE — Progress Notes (Signed)
Cardiology Office Note:    Date:  01/02/2019   ID:  Brandy Cervantes, DOB 12/16/1945, MRN [id number]  PCP:  Brandy Mess, PA-C  Cardiologist:  Brandy More, MD    Referring MD: Brandy Mess, PA-C    ASSESSMENT:    1. Shortness of breath   2. Fatigue, unspecified type   3. CAD in native artery   4. Essential hypertension   5. Hypotension due to drugs   6. Mixed hyperlipidemia    PLAN:    In order of problems listed above:  1. SOB - Onset 1 month ago. Noted with activity, talking, positional changes. Sleeping in recliner at night. Denies wheeze, cough. Denies LE edema - endorses intermittent swelling in her hands in the morning. No edema on exam. Denies bleeding complications, hematuria, melena.   Etiology HF vs PE vs anemia vs pulmonary disease.  D-dimer, ProBNP, CBC, CXR, Echo ordered today. 2. Fatigue - Onset 1 month ago. States she just doesn't feel she has energy to do anything. Noted stress at home that could be could be contributing.   Plan, as above. Consider additional labs to include B12, vitamin D, TSH if no evident cause from currently ordered testing.  3. CAD - Took nitroglycerin one time Cervantes week ago for "icepick like" pain under her L breast with no change. Would not pursue ischemic evaluation at this time due to workup of SOB - consider ischemic evaluation if no cause of SOB/fatigue identified with above testing.   DAPT aspirin and plavix, no bleeding, 1 year will be completed 01/2019.   GDMT aspirin, statin. BP and HR well controlled, would not add beta blocker at this time with previous hypotension.   Continue Imdur daily and Nitro PRN. 4. HTN - BP log from home well controlled. Continue current antihypertensive regimen. 5. Hypotension due to drugs - Reports intermittent dizziness with positional changes. Encouraged to make positional changes slowly. No hypotensive BP readings. Continue current antihypertensive regimen. 6. HLD - Stable, LDL 60 on 06/10/18.  Continue pravastatin. Recommend low salt, heart healthy diet.  This was Cervantes complicated visit with new and worsening symptoms of shortness of breath lightheadedness and fatigue and requiring further noninvasive evaluation including x-ray ultrasound laboratory test and formulating Cervantes plan for follow-up.  25 minutes was spent with the patient during this process Next appointment: 2 weeks in office after echocardiogram   Medication Adjustments/Labs and Tests Ordered: Current medicines are reviewed at length with the patient today.  Concerns regarding medicines are outlined above.  Orders Placed This Encounter  Procedures  . DG Chest 2 View  . Basic Metabolic Panel (BMET)  . CBC  . D-Dimer, Quantitative  . Pro b natriuretic peptide (BNP)[facility])  . ECHOCARDIOGRAM COMPLETE   No orders of the defined types were placed in this encounter.   Chief Complaint  Patient presents with  . Follow-up    for   . Coronary Artery Disease  . Fatigue    History of Present Illness:    Brandy Cervantes is Cervantes 73 y.o. female with Cervantes hx of CAD s/p PCI DES prox LAD EF 50-55% 01/29/18 and repeat PCI 02/27/18 with patent stent and presumed coronary artery vasospasm, HTN, HLD last seen 09/05/18.   She is seen today after calling the office complaining of severe SOB, fatigue, occasional "ribcage" pain. Prior to onset was able to garden multiple hours without difficulty. Onset one month ago of SOB, fatigue. Reports sleeping in Cervantes chair at night. SOB with exertion,  speaking, and bending over the pet her dogs. Denies LE edema. Intermittent swelling in her hands in the morning. Does endorse some stress at home. Denies cough, wheeze, acute illness. Denies melena, hematura, bleeding problems.   Occasional "ribcage" pain over the last month, intermittent, self-resolves after Cervantes few seconds. Feels like an "ice pick" below her left breast radiating to mid abdomen. Does not radiate to chest. Took nitroglycerin  one time for these episodes, rated 4/10 on pain scale, with no change.   Reports intermittent dizziness. Happens with positional changes. Recommended careful changing of positions.   BP log from home shows HR 60s-70s and BP in good range.   Compliance with diet, lifestyle and medications: Yes Past Medical History:  Diagnosis Date  . Anxiety   . Arthritis    "all over" (02/01/2018)  . 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 Bilateral knee pain 10/28/2013  . Bilateral primary osteoarthritis of knee 11/17/2016  . Chronic back pain    "qwhere" (01/29/2018)  . Chronic pain 10/18/2015  . Degenerative disc disease, cervical 10/28/2013  . Degenerative disc disease, lumbar 10/28/2013  . Depression   . Esophageal dysphagia 12/31/2017  . Essential hypertension 10/18/2015  . Exposure to TB    "my mother died when she was [age] of TB"  . GAD (generalized anxiety disorder) 10/18/2015  . GERD (gastroesophageal reflux disease) 12/31/2017  . History of gout    "I've had it occasionally" (11/23/2016)  . Hypertension   . Insomnia 10/18/2015  . Migraine    "Cervantes few/year; Cervantes in the last few weeks" (01/29/2018)  . Mixed hyperlipidemia 10/19/2017  . Neck pain 10/28/2013  . Osteoarthritis 10/18/2015  . Osteoporosis 10/18/2015  . Other chest pain 12/31/2017  . Pain syndrome, chronic 10/28/2013  . Raynaud's disease    feet    Past Surgical History:  Procedure Laterality Date  . CARDIAC CATHETERIZATION    . CORONARY ANGIOPLASTY    . CORONARY STENT INTERVENTION N/Cervantes 01/29/2018   Procedure: CORONARY STENT INTERVENTION;  Surgeon: Brandy Cervantes, Brandy A, MD;  [facility];  Service: Cardiovascular;  Laterality: N/Cervantes;  . JOINT REPLACEMENT    . LEFT HEART CATH AND CORONARY ANGIOGRAPHY N/Cervantes 01/29/2018   Procedure: LEFT HEART CATH AND CORONARY ANGIOGRAPHY;  Surgeon: Brandy Cervantes, Brandy A, MD;  Location: [facility] INVASIVE CV LAB;  Service: Cardiovascular;  Laterality: N/Cervantes;  . LEFT HEART CATH AND CORS/GRAFTS ANGIOGRAPHY N/Cervantes  03/04/2018   Procedure: LEFT HEART CATH AND CORS/GRAFTS ANGIOGRAPHY;  Surgeon: Brandy Cervantes, Christopher, MD;  Location: [facility] INVASIVE CV LAB;  Service: Cardiovascular;  Laterality: N/Cervantes;  . MASTOIDECTOMY Right ~ 1955  . TOTAL KNEE ARTHROPLASTY Bilateral 11/22/2016   Procedure: TOTAL KNEE BILATERAL;  Surgeon: Gean Birchwoodowan, Frank, MD;  [facility];  Service: Orthopedics;  Laterality: Bilateral;    Current Medications: Current Meds  Medication Sig  . Ascorbic Acid (VITAMIN C) 1000 MG tablet Take 1,000 mg by mouth daily.  Marland Kitchen. aspirin EC 81 MG tablet Take 1 tablet (81 mg total) by mouth daily.  Marland Kitchen. b complex vitamins capsule Take 1 capsule by mouth daily.  . Cholecalciferol (VITAMIN D3) 1000 units CAPS Take 1,000 Units by mouth daily.  . clopidogrel (PLAVIX) 75 MG tablet TAKE 1 TABLET BY MOUTH DAILY  . fluticasone (FLONASE) 50 MCG/ACT nasal spray Place 2 sprays into both nostrils daily as needed for allergies.   Marland Kitchen. HYDROcodone-acetaminophen (NORCO) 10-325 MG tablet Take 1 tablet by mouth every 6 (six) hours as needed for moderate pain.  . isosorbide  mononitrate (IMDUR) 60 MG 24 hr tablet TAKE 1 TABLET BY MOUTH DAILY  . morphine (MS CONTIN) 30 MG 12 hr tablet Take 30 mg by mouth every 12 (twelve) hours.  . Multiple Vitamins-Minerals (SENIOR MULTIVITAMIN PLUS) TABS Take 1 tablet by mouth daily.  . nitroGLYCERIN (NITROSTAT) 0.4 MG SL tablet Place 1 tablet (0.4 mg total) under the tongue every 5 (five) minutes x 3 doses as needed for chest pain.  . pantoprazole (PROTONIX) 40 MG tablet Take 1 tablet (40 mg total) by mouth daily.  Marland Kitchen. PARoxetine (PAXIL) 10 MG tablet Take 5 mg by mouth 2 (two) times daily.  . rosuvastatin (CRESTOR) 20 MG tablet Take 1 tablet (20 mg total) by mouth at bedtime.  . traZODone (DESYREL) 50 MG tablet Take 1 tablet by mouth at bedtime.     Allergies:   Aspirin, Lisinopril, Topiramate, Alendronate sodium, Celecoxib, Diphenhydramine hcl, Gabapentin, Nsaids, Pravastatin, and Sumatriptan   Social  History   Socioeconomic History  . Marital status: Married    Spouse name: Not on file  . Number of children: Not on file  . Years of education: Not on file  . Highest education level: Not on file  Occupational History  . Not on file  Social Needs  . Financial resource strain: Not on file  . Food insecurity    Worry: Not on file    Inability: Not on file  . Transportation needs    Medical: Not on file    Non-medical: Not on file  Tobacco Use  . Smoking status: Never Smoker  . Smokeless tobacco: Never Used  Substance and Sexual Activity  . Alcohol use: No  . Drug use: No  . Sexual activity: Not Currently  Lifestyle  . Physical activity    Days per week: Not on file    Minutes per session: Not on file  . Stress: Not on file  Relationships  . Social Musicianconnections    Talks on phone: Not on file    Gets together: Not on file    Attends religious service: Not on file    Active member of club or organization: Not on file    Attends meetings of clubs or organizations: Not on file    Relationship status: Not on file  Other Topics Concern  . Not on file  Social History Narrative  . Not on file     Family History: The patient's family history includes Heart attack in her father; Heart disease in her brother; Hyperlipidemia in her father; Tuberculosis in her mother. ROS:   Please see the history of present illness.    All other systems reviewed and are negative. Review of Systems  Constitution: Positive for malaise/fatigue. Negative for chills, fever and weight gain.  Cardiovascular: Positive for dyspnea on exertion. Negative for chest pain, irregular heartbeat, leg swelling, near-syncope and palpitations.  Respiratory: Positive for shortness of breath. Negative for cough and wheezing.   Musculoskeletal: Positive for back pain (at baseline, chronic) and myalgias (L ribcage pain). Negative for muscle weakness.  Gastrointestinal: Negative for melena, nausea and vomiting.   Genitourinary: Negative for hematuria.  Neurological: Positive for dizziness ("sometimes"). Negative for light-headedness and weakness.     EKGs/Labs/Other Studies Reviewed:    The following studies were reviewed today:  Cardiac cath 03/04/18 Left Anterior Descending  Prox LAD lesion 0% stenosed  Previously placed Prox LAD stent (unknown type) is widely patent.  Mid LAD lesion 20% stenosed  Mid LAD lesion is 20% stenosed. The lesion  is eccentric.  First Diagonal Branch  Vessel is small in size.  Right Coronary Artery  Prox RCA lesion 20% stenosed  Prox RCA lesion is 20% stenosed. The lesion is focal and eccentric.  Mid RCA lesion 30% stenosed  Mid RCA lesion is 30% stenosed. Lesion became Cervantes apparent following administration of intracoronary nitroglycerin with overall vessel size increasing with nitroglycerin. Residual stenosis may reflect continued spams versus fixed stenosis.   Conclusions: 1. Mild non-obstructive coronary artery disease.  An element of vasospasm is suspected given improvement in some lesions with intracoronary nitroglycerin. 2. Widely patent proximal LAD stent, unchanged in appearance from 01/29/18. 3. Low LVEDP with normal LVEF.   Recommendations: 1. Increase isosorbide mononitrate to 60 mg daily.  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 2. Complete at least 12 months of DAPT from time of PCI last month. 3. Could consider discharge home this afternoon if the patient is able to ambulate without further chest pain.   Recommend uninterrupted dual antiplatelet therapy with Aspirin 81mg  daily and Ticagrelor 90mg  twice daily for Cervantes minimum of 12 months (ACS - Class I recommendation).    EKG:  None today.  Recent Labs: 01/27/2018: Magnesium 2.4; TSH 3.068 07/24/2018: ALT 29; BUN 18; Creatinine, Ser 1.21; Hemoglobin 14.4; NT-Pro BNP 187; Platelets 300; Potassium 4.5; Sodium 142  Recent Lipid Panel    Component Value Date/Time   CHOL  141 06/10/2018 1456   TRIG 134 06/10/2018 1456   HDL 54 06/10/2018 1456   CHOLHDL 2.6 06/10/2018 1456   CHOLHDL 4.5 01/28/2018 0326   VLDL 42 (H) 01/28/2018 0326   LDLCALC 60 06/10/2018 1456    Physical Exam:    VS:  BP 128/80 (BP Location: Right Arm, Patient Position: Sitting, Cuff Size: Normal)   Pulse 76   Temp 98.4 F (36.9 C)   Ht 5' 3.5" (1.613 m)   Wt 141 lb 12.8 oz (64.3 kg)   SpO2 97%   BMI 24.72 kg/m     Wt Readings from Last 3 Encounters:  01/02/19 141 lb 12.8 oz (64.3 kg)  09/05/18 143 lb (64.9 kg)  07/24/18 143 lb (64.9 kg)     GEN:  Well nourished, well developed in no acute distress HEENT: Normal NECK: No JVD; No carotid bruits LYMPHATICS: No lymphadenopathy CARDIAC: RRR, no murmurs, rubs, gallops RESPIRATORY:  Clear to auscultation without rales, wheezing or rhonchi, breathless with long sentences ABDOMEN: Soft, non-tender, non-distended MUSCULOSKELETAL:  No edema; No deformity  SKIN: Warm and dry NEUROLOGIC:  Alert and oriented x 3 PSYCHIATRIC:  Normal affect, anxious    Signed, Norman HerrlichBrian Leah Skora, MD  01/02/2019 11:06 AM    [facility]

## 2019-01-02 ENCOUNTER — Ambulatory Visit (INDEPENDENT_AMBULATORY_CARE_PROVIDER_SITE_OTHER): Payer: Medicare Other | Admitting: Cardiology

## 2019-01-02 ENCOUNTER — Other Ambulatory Visit: Payer: Self-pay

## 2019-01-02 ENCOUNTER — Encounter: Payer: Self-pay | Admitting: Cardiology

## 2019-01-02 ENCOUNTER — Ambulatory Visit (HOSPITAL_BASED_OUTPATIENT_CLINIC_OR_DEPARTMENT_OTHER)
Admission: RE | Admit: 2019-01-02 | Discharge: 2019-01-02 | Disposition: A | Discharge status: Home / Self Care | Payer: Medicare Other | Source: Ambulatory Visit | Attending: Cardiology | Admitting: Cardiology

## 2019-01-02 VITALS — BP 128/80 | HR 76 | Temp 98.4°F | Ht 63.5 in | Wt 141.8 lb

## 2019-01-02 DIAGNOSIS — R5383 Other fatigue: Secondary | ICD-10-CM

## 2019-01-02 DIAGNOSIS — I952 Hypotension due to drugs: Secondary | ICD-10-CM

## 2019-01-02 DIAGNOSIS — I10 Essential (primary) hypertension: Secondary | ICD-10-CM

## 2019-01-02 DIAGNOSIS — I2510 Atherosclerotic heart disease of native coronary artery without angina pectoris: Secondary | ICD-10-CM

## 2019-01-02 DIAGNOSIS — R0602 Shortness of breath: Secondary | ICD-10-CM | POA: Diagnosis not present

## 2019-01-02 DIAGNOSIS — E782 Mixed hyperlipidemia: Secondary | ICD-10-CM

## 2019-01-02 HISTORY — DX: Other fatigue: R53.83

## 2019-01-02 NOTE — Patient Instructions (Addendum)
Medication Instructions:  Your physician recommends that you continue on your current medications as directed. Please refer to the Current Medication list given to you today.  If you need a refill on your cardiac medications before your next appointment, please call your pharmacy.   Lab work: Your physician recommends that you return for lab work in: TODAY  BMP,CBC,Ddimer,Pro BNP  If you have labs (blood work) drawn today and your tests are completely normal, you will receive your results only by: Marland Kitchen. MyChart Message (if you have MyChart) OR . A paper copy in the mail If you have any lab test that is abnormal or we need to change your treatment, we will call you to review the results.  Testing/Procedures: Chest xray  Your physician has requested that you have an echocardiogram. Echocardiography is a painless test that uses sound waves to create images of your heart. It provides your doctor with information about the size and shape of your heart and how well your heart's chambers and valves are working. This procedure takes approximately one hour. There are no restrictions for this procedure.    Follow-Up: At [facility], you and your health needs are our priority.  As part of our continuing mission to provide you with exceptional heart care, we have created designated Provider Care Teams.  These Care Teams include your primary Cardiologist (physician) and Advanced Practice Providers (APPs -  Physician Assistants and Nurse Practitioners) who all work together to provide you with the care you need, when you need it. You will need a follow up appointment in 2 weeks after echo.  Any Other Special Instructions Will Be Listed Below (If Applicable).   Echocardiogram An echocardiogram is a procedure that uses painless sound waves (ultrasound) to produce an image of the heart. Images from an echocardiogram can provide important information about:  Signs of coronary artery disease  (CAD).  Aneurysm detection. An aneurysm is a weak or damaged part of an artery wall that bulges out from the normal force of blood pumping through the body.  Heart size and shape. Changes in the size or shape of the heart can be associated with certain conditions, including heart failure, aneurysm, and CAD.  Heart muscle function.  Heart valve function.  Signs of a past heart attack.  Fluid buildup around the heart.  Thickening of the heart muscle.  A tumor or infectious growth around the heart valves. Tell a health care provider about:  Any allergies you have.  All medicines you are taking, including vitamins, herbs, eye drops, creams, and over-the-counter medicines.  Any blood disorders you have.  Any surgeries you have had.  Any medical conditions you have.  Whether you are pregnant or may be pregnant. What are the risks? Generally, this is a safe procedure. However, problems may occur, including:  Allergic reaction to dye (contrast) that may be used during the procedure. What happens before the procedure? No specific preparation is needed. You may eat and drink normally. What happens during the procedure?   An IV tube may be inserted into one of your veins.  You may receive contrast through this tube. A contrast is an injection that improves the quality of the pictures from your heart.  A gel will be applied to your chest.  A wand-like tool (transducer) will be moved over your chest. The gel will help to transmit the sound waves from the transducer.  The sound waves will harmlessly bounce off of your heart to allow the heart images  to be captured in real-time motion. The images will be recorded on a computer. The procedure may vary among health care providers and hospitals. What happens after the procedure?  You may return to your normal, everyday life, including diet, activities, and medicines, unless your health care provider tells you not to do  that. Summary  An echocardiogram is a procedure that uses painless sound waves (ultrasound) to produce an image of the heart.  Images from an echocardiogram can provide important information about the size and shape of your heart, heart muscle function, heart valve function, and fluid buildup around your heart.  You do not need to do anything to prepare before this procedure. You may eat and drink normally.  After the echocardiogram is completed, you may return to your normal, everyday life, unless your health care provider tells you not to do that. This information is not intended to replace advice given to you by your health care provider. Make sure you discuss any questions you have with your health care provider. Document Released: 06/30/2000 Document Revised: 08/05/2016 Document Reviewed: 08/05/2016 Elsevier Interactive Patient Education  [employer].  '

## 2019-01-03 ENCOUNTER — Ambulatory Visit (HOSPITAL_BASED_OUTPATIENT_CLINIC_OR_DEPARTMENT_OTHER)
Admission: RE | Admit: 2019-01-03 | Discharge: 2019-01-03 | Disposition: A | Discharge status: Home / Self Care | Payer: Medicare Other | Source: Ambulatory Visit | Attending: Cardiology | Admitting: Cardiology

## 2019-01-03 ENCOUNTER — Other Ambulatory Visit: Payer: Self-pay

## 2019-01-03 ENCOUNTER — Encounter (HOSPITAL_COMMUNITY): Payer: Self-pay

## 2019-01-03 ENCOUNTER — Telehealth: Payer: Self-pay | Admitting: *Deleted

## 2019-01-03 ENCOUNTER — Encounter (HOSPITAL_BASED_OUTPATIENT_CLINIC_OR_DEPARTMENT_OTHER): Payer: Self-pay

## 2019-01-03 ENCOUNTER — Emergency Department (HOSPITAL_COMMUNITY)
Admission: EM | Admit: 2019-01-03 | Discharge: 2019-01-03 | Disposition: A | Discharge status: Home / Self Care | Payer: Medicare Other | Attending: Emergency Medicine | Admitting: Emergency Medicine

## 2019-01-03 ENCOUNTER — Encounter: Payer: Self-pay | Admitting: *Deleted

## 2019-01-03 DIAGNOSIS — R0602 Shortness of breath: Secondary | ICD-10-CM

## 2019-01-03 DIAGNOSIS — I10 Essential (primary) hypertension: Secondary | ICD-10-CM | POA: Insufficient documentation

## 2019-01-03 DIAGNOSIS — Z79899 Other long term (current) drug therapy: Secondary | ICD-10-CM | POA: Insufficient documentation

## 2019-01-03 DIAGNOSIS — Z20828 Contact with and (suspected) exposure to other viral communicable diseases: Secondary | ICD-10-CM | POA: Diagnosis not present

## 2019-01-03 DIAGNOSIS — R7989 Other specified abnormal findings of blood chemistry: Secondary | ICD-10-CM

## 2019-01-03 LAB — CBC
Hematocrit: 44.8 % (ref 34.0–46.6)
Hemoglobin: 14.8 g/dL (ref 11.1–15.9)
MCH: 30.1 pg (ref 26.6–33.0)
MCHC: 33 g/dL (ref 31.5–35.7)
MCV: 91 fL (ref 79–97)
Platelets: 259 10*3/uL (ref 150–450)
RBC: 4.91 x10E6/uL (ref 3.77–5.28)
RDW: 12.4 % (ref 11.7–15.4)
WBC: 7 10*3/uL (ref 3.4–10.8)

## 2019-01-03 LAB — BASIC METABOLIC PANEL
BUN/Creatinine Ratio: 15 (ref 12–28)
BUN: 10 mg/dL (ref 8–27)
CO2: 26 mmol/L (ref 20–29)
Calcium: 9 mg/dL (ref 8.7–10.3)
Chloride: 101 mmol/L (ref 96–106)
Creatinine, Ser: 0.67 mg/dL (ref 0.57–1.00)
GFR calc Af Amer: 102 mL/min/{1.73_m2} (ref >59)
GFR calc non Af Amer: 88 mL/min/{1.73_m2} (ref >59)
Glucose: 98 mg/dL (ref 65–99)
Potassium: 4.4 mmol/L (ref 3.5–5.2)
Sodium: 141 mmol/L (ref 134–144)

## 2019-01-03 LAB — D-DIMER, QUANTITATIVE: D-DIMER: 1.68 mg/L FEU — ABNORMAL HIGH (ref 0.00–0.49)

## 2019-01-03 LAB — PRO B NATRIURETIC PEPTIDE: NT-Pro BNP: 332 pg/mL — ABNORMAL HIGH (ref 0–301)

## 2019-01-03 MED ORDER — IOHEXOL 350 MG/ML SOLN
100.0000 mL | Freq: Once | INTRAVENOUS | Status: AC | PRN
  Administered 2019-01-03: 61 mL via INTRAVENOUS

## 2019-01-03 MED ORDER — ALBUTEROL SULFATE HFA 108 (90 BASE) MCG/ACT IN AERS
8.0000 | INHALATION_SPRAY | Freq: Once | RESPIRATORY_TRACT | Status: AC
  Administered 2019-01-03: 19:00:00 8 via RESPIRATORY_TRACT
  Filled 2019-01-03: qty 6.7

## 2019-01-03 NOTE — Telephone Encounter (Signed)
Patient informed of lab results and advised that Dr. Bettina Gavia recommends a CTA chest PE w/wo contrast to rule out pulmonary embolism due to elevated d-dimer. Patient is agreeable and verbalized understanding.   No precert needed per Dorian Pod. Patient will come to the [facility] for testing this afternoon.   No further questions.

## 2019-01-03 NOTE — ED Triage Notes (Signed)
Pt reports SOB on exertion since the beginning of may, pt seen by cardiologist and had blood work done yesterday with an elevated d dimer. Pt had Ct PE study done today, no PE shown but cardiologist told pt to come here for concern of COVID 19. Pt denies cough or fever. Pt in nad at this time.

## 2019-01-03 NOTE — Telephone Encounter (Signed)
-----   Message from Richardo Priest, MD sent at 01/03/2019  7:48 AM EDT ----- Normal or stable result  Good results

## 2019-01-03 NOTE — ED Provider Notes (Signed)
[facility] EMERGENC[facility] Provider Note   CSN: [id number] Arrival date & time: 01/03/19  1714    History   Chief Complaint Chief Complaint  Patient presents with  . Shortness of Breath    HPI Brandy Cervantes is a 73 y.o. female.      Shortness of Breath Severity:  Mild Onset quality:  Gradual Duration:  6 weeks Timing:  Intermittent Progression:  Unchanged Chronicity:  New Context: activity, emotional upset and URI   Context: not known allergens and not smoke exposure   Relieved by:  Nothing Worsened by:  Activity, coughing, deep breathing, exertion, movement, stress, weather changes and emotional stress Ineffective treatments:  Rest, sitting up, position changes and lying down Associated symptoms: no abdominal pain, no chest pain, no cough, no diaphoresis, no fever, no headaches, no neck pain, no PND, no rash, no sore throat, no sputum production, no vomiting and no wheezing   Risk factors: no hx of PE/DVT and no obesity     Past Medical History:  Diagnosis Date  . Anxiety   . Arthritis    "all over" (02/01/2018)  . 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 Bilateral knee pain 10/28/2013  . Bilateral primary osteoarthritis of knee 11/17/2016  . Chronic back pain    "qwhere" (01/29/2018)  . Chronic pain 10/18/2015  . Degenerative disc disease, cervical 10/28/2013  . Degenerative disc disease, lumbar 10/28/2013  . Depression   . Esophageal dysphagia 12/31/2017  . Essential hypertension 10/18/2015  . Exposure to TB    "my mother died when she was [age] of TB"  . GAD (generalized anxiety disorder) 10/18/2015  . GERD (gastroesophageal reflux disease) 12/31/2017  . History of gout    "I've had it occasionally" (11/23/2016)  . Hypertension   . Insomnia 10/18/2015  . Migraine    "a few/year; more in the last few weeks" (01/29/2018)  . Mixed hyperlipidemia 10/19/2017  . Neck pain 10/28/2013  . Osteoarthritis 10/18/2015  . Osteoporosis 10/18/2015  . Other  chest pain 12/31/2017  . Pain syndrome, chronic 10/28/2013  . Raynaud's disease    feet    Patient Active Problem List   Diagnosis Date Noted  . Fatigue 01/02/2019  . Hypotension 07/24/2018  . Shortness of breath 07/24/2018  . Chest pain 03/03/2018  . CAD in native artery 02/04/2018  . Raynaud's disease   . Unstable angina (HCC) 01/27/2018  . Abnormal EKG 01/24/2018  . Esophageal dysphagia 12/31/2017  . GERD (gastroesophageal reflux disease) 12/31/2017  . Other chest pain 12/31/2017  . Mixed hyperlipidemia 10/19/2017  . Bilateral primary osteoarthritis of knee 11/17/2016  . 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 Chronic pain 10/18/2015  . Essential hypertension 10/18/2015  . GAD (generalized anxiety disorder) 10/18/2015  . Insomnia 10/18/2015  . Osteoarthritis 10/18/2015  . Osteoporosis 10/18/2015  . Bilateral knee pain 10/28/2013  . Degenerative disc disease, cervical 10/28/2013  . Degenerative disc disease, lumbar 10/28/2013  . Low back pain 10/28/2013  . Neck pain 10/28/2013  . Pain syndrome, chronic 10/28/2013    Past Surgical History:  Procedure Laterality Date  . CARDIAC CATHETERIZATION    . CORONARY ANGIOPLASTY    . CORONARY STENT INTERVENTION N/A 01/29/2018   Procedure: CORONARY STENT INTERVENTION;  Surgeon: Lennette BihariKelly, Thomas A, MD;  [facility];  Service: Cardiovascular;  Laterality: N/A;  . JOINT REPLACEMENT    . LEFT HEART CATH AND CORONARY ANGIOGRAPHY N/A 01/29/2018   Procedure: LEFT HEART CATH AND CORONARY ANGIOGRAPHY;  Surgeon: Lennette BihariKelly, Thomas A, MD;  [facility];  Service: Cardiovascular;  Laterality: N/A;  . LEFT HEART CATH AND CORS/GRAFTS ANGIOGRAPHY N/A 03/04/2018   Procedure: LEFT HEART CATH AND CORS/GRAFTS ANGIOGRAPHY;  Surgeon: Yvonne KendallEnd, Christopher, MD;  Location: [facility] INVASIVE CV LAB;  Service: Cardiovascular;  Laterality: N/A;  . MASTOIDECTOMY Right ~ 1955  . TOTAL KNEE ARTHROPLASTY Bilateral 11/22/2016   Procedure:  TOTAL KNEE BILATERAL;  Surgeon: Gean Birchwoodowan, Frank, MD;  [facility];  Service: Orthopedics;  Laterality: Bilateral;     OB History   No obstetric history on file.      Home Medications    Prior to Admission medications   Medication Sig Start Date End Date Taking? Authorizing Provider  Ascorbic Acid (VITAMIN C) 1000 MG tablet Take 1,000 mg by mouth daily.    [provider]  aspirin EC 81 MG tablet Take 1 tablet (81 mg total) by mouth daily. 02/05/18   Baldo DaubMunley, Brian J, MD  b complex vitamins capsule Take 1 capsule by mouth daily.    [provider]  Cholecalciferol (VITAMIN D3) 1000 units CAPS Take 1,000 Units by mouth daily.    [provider]  clopidogrel (PLAVIX) 75 MG tablet TAKE 1 TABLET BY MOUTH DAILY 04/18/18   Baldo DaubMunley, Brian J, MD  fluticasone (FLONASE) 50 MCG/ACT nasal spray Place 2 sprays into both nostrils daily as needed for allergies.  08/04/13   [provider]  HYDROcodone-acetaminophen (NORCO) 10-325 MG tablet Take 1 tablet by mouth every 6 (six) hours as needed for moderate pain.    [provider]  isosorbide mononitrate (IMDUR) 60 MG 24 hr tablet TAKE 1 TABLET BY MOUTH DAILY 12/17/18   Baldo DaubMunley, Brian J, MD  morphine (MS CONTIN) 30 MG 12 hr tablet Take 30 mg by mouth every 12 (twelve) hours.    [provider]  Multiple Vitamins-Minerals (SENIOR MULTIVITAMIN PLUS) TABS Take 1 tablet by mouth daily.    [provider]  nitroGLYCERIN (NITROSTAT) 0.4 MG SL tablet Place 1 tablet (0.4 mg total) under the tongue every 5 (five) minutes x 3 doses as needed for chest pain. 01/30/18   [facility], Roe RutherfordAngela Nicole, PA  OVER THE COUNTER MEDICATION Take 1 tablet by mouth at bedtime as needed (for sleep). Calms Forte    [provider]  pantoprazole (PROTONIX) 40 MG tablet Take 1 tablet (40 mg total) by mouth daily. 01/30/18   [facility], Roe RutherfordAngela Nicole, PA  PARoxetine (PAXIL) 10 MG tablet Take 5 mg by mouth 2 (two) times daily. 08/22/16    [provider]  rosuvastatin (CRESTOR) 20 MG tablet Take 1 tablet (20 mg total) by mouth at bedtime. 01/30/18 01/30/19  Marcelino Dusteruke, Angela Nicole, PA  traZODone (DESYREL) 50 MG tablet Take 1 tablet by mouth at bedtime.    [provider]    Family History Family History  Problem Relation Age of Onset  . Tuberculosis Mother   . Heart attack Father   . Hyperlipidemia Father   . Heart disease Brother     Social History Social History   Tobacco Use  . Smoking status: Never Smoker  . Smokeless tobacco: Never Used  Substance Use Topics  . Alcohol use: No  . Drug use: No     Allergies   Aspirin, Lisinopril, Topiramate, Alendronate sodium, Celecoxib, Diphenhydramine hcl, Gabapentin, Nsaids, Pravastatin, and Sumatriptan   Review of Systems Review of Systems  Constitutional: Negative for diaphoresis and fever.  HENT: Negative for sore throat.   Respiratory:  Positive for shortness of breath. Negative for cough, sputum production and wheezing.   Cardiovascular: Negative for chest pain and PND.  Gastrointestinal: Negative for abdominal pain and vomiting.  Musculoskeletal: Negative for neck pain.  Skin: Negative for rash.  Neurological: Negative for headaches.  All other systems reviewed and are negative.    Physical Exam Updated Vital Signs BP (!) 147/82   Pulse 69   Temp 98.3 F (36.8 C) (Oral)   Resp 19   Ht 5' 3.5" (1.613 m)   Wt 64.3 kg   SpO2 97%   BMI 24.72 kg/m   Physical Exam Vitals signs and nursing note reviewed.  Constitutional:      General: She is not in acute distress.    Appearance: She is well-developed.  HENT:     Head: Normocephalic and atraumatic.  Eyes:     Conjunctiva/sclera: Conjunctivae normal.  Neck:     Musculoskeletal: Neck supple.     Vascular: No JVD.  Cardiovascular:     Rate and Rhythm: Normal rate.  Pulmonary:     Effort: Pulmonary effort is normal. No tachypnea, bradypnea, accessory muscle usage or respiratory  distress.     Breath sounds: No decreased breath sounds, wheezing or rhonchi.  Chest:     Chest wall: No tenderness.  Abdominal:     Palpations: Abdomen is soft.     Tenderness: There is no abdominal tenderness.  Musculoskeletal:     Right lower leg: No edema.     Left lower leg: No edema.  Skin:    General: Skin is warm and dry.     Capillary Refill: Capillary refill takes less than 2 seconds.  Neurological:     General: No focal deficit present.     Mental Status: She is alert and oriented to person, place, and time.      ED Treatments / Results  Labs (all labs ordered are listed, but only abnormal results are displayed) Labs Reviewed  NOVEL CORONAVIRUS, NAA (HOSPITAL ORDER, SEND-OUT TO REF LAB)    EKG None  Radiology Dg Chest 2 View  Result Date: 01/02/2019 CLINICAL DATA:  SOB X 6 weeks, getting worse. Dizziness, episodes of chest pain lower anterior left side. Hx of heart stint placement. EXAM: CHEST - 2 VIEW COMPARISON:  10/01/2018 FINDINGS: Cardiac silhouette is normal in size and configuration. No mediastinal or hilar masses. No evidence of adenopathy. Clear lungs.  No pleural effusion or pneumothorax. Skeletal structures are intact. IMPRESSION: No active cardiopulmonary disease. Electronically Signed   By: Amie Portlandavid  Ormond M.D.   On: 01/02/2019 15:13   Ct Angio Chest Pe W Or Wo Contrast  Result Date: 01/03/2019 CLINICAL DATA:  Shortness of breath.  Elevated D-dimer. EXAM: CT ANGIOGRAPHY CHEST WITH CONTRAST TECHNIQUE: Multidetector CT imaging of the chest was performed using the standard protocol during bolus administration of intravenous contrast. Multiplanar CT image reconstructions and MIPs were obtained to evaluate the vascular anatomy. CONTRAST:  61mL OMNIPAQUE IOHEXOL 350 MG/ML SOLN COMPARISON:  None. FINDINGS: Cardiovascular: The heart is normal in size. No pericardial effusion. The aorta is normal in caliber. Scattered atherosclerotic calcifications. Coronary artery  calcifications are noted. A coronary artery stent is noted involving the left circumflex artery. The pulmonary arterial tree is fairly well opacified. No filling defects to suggest pulmonary embolism. Mediastinum/Nodes: No mediastinal or hilar mass or adenopathy. Small scattered lymph nodes are noted. The esophagus is grossly normal. Lungs/Pleura: Patchy and somewhat mosaic pattern of ground-glass attenuation likely due to reactive airways disease/asthma,  respiratory bronchiolitis, cryptogenic organizing pneumonia or hypersensitivity pneumonitis. No focal airspace consolidation or pleural effusion. No worrisome pulmonary lesions. Upper Abdomen: No significant upper abdominal findings. Musculoskeletal: No breast masses, supraclavicular or axillary adenopathy. Thyroid gland appears normal. No significant bony findings. Review of the MIP images confirms the above findings. IMPRESSION: Impression 1. No CT findings for pulmonary embolism. 2. Mild tortuosity and calcification of the thoracic aorta but no aneurysm. 3. Mosaic pattern of ground-glass attenuation in the lungs likely due to reactive airways disease as detailed above. 4. No focal infiltrates or pleural effusions. Aortic Atherosclerosis (ICD10-I70.0). Electronically Signed   By: Marijo Sanes M.D.   On: 01/03/2019 14:42    Procedures Procedures (including critical care time)  Medications Ordered in ED Medications  albuterol (VENTOLIN HFA) 108 (90 Base) MCG/ACT inhaler 8 puff (8 puffs Inhalation Given 01/03/19 1921)     Initial Impression / Assessment and Plan / ED Course  I have reviewed the triage vital signs and the nursing notes.  Pertinent labs & imaging results that were available during my care of the patient were reviewed by me and considered in my medical decision making (see chart for details).        Medical Decision Making: Miriam Liles is a 73 y.o. female who presented to the ED today with shortness of breath.  Past medical  history significant for hyperlipidemia, hypertension, CAD, fatigue Reviewed and confirmed nursing documentation for past medical history, family history, social history.  On my initial exam, the pt was calm, cooperative, conversant, follows commands appropriately, GCS 15, not tachycardic, not hypotensive, afebrile, no increased work of breathing or respiratory distress, no signs of impending respiratory failure.  Seen by cardiology as an outpatient, being worked up for new onset heart failure, d-dimer was elevated so CTA was obtained as an outpatient, negative for VTE but showed groundglass opacities bilaterally, concerning for viral bronchiolitis or viral pneumonia, patient referred to the ED for further evaluation and care  Viral versus bacterial pneumonia considered however patient denies any infectious review of systems, no fevers, chills, cough, will send viral testing Pneumothorax considered however bilateral breath sounds, recent CT scan showed no pneumothorax Volume overload considered however no peripheral edema, no JVD present on physical exam, patient has outpatient echo ordered per cardiology ACS considered, however no concerning left-sided chest pain, will obtain EKG for further assessment, EKG showed sinus rhythm, normal rate, no acute ischemic changes, compared with prior EKG from 03/04/2018, no new emergent changes visualized VTE considered however negative CTA for PE  All radiology and laboratory studies reviewed independently and with my attending physician, agree with reading provided by radiologist unless otherwise noted.  Upon reassessing patient, patient was calm, resting comfortably, no new complaints Based on the above findings, I believe patient is hemodynamically stable for discharge.  Patient educated about specific return precautions for given chief complaint and symptoms.  Patient educated about follow-up with PCP and cardiology.  Patient expressed understanding of return  precautions and need for follow-up.  Patient discharged.  The above care was discussed with and agreed upon by my attending physician. Emergency Department Medication Summary:  Medications  albuterol (VENTOLIN HFA) 108 (90 Base) MCG/ACT inhaler 8 puff (8 puffs Inhalation Given 01/03/19 1921)   Final Clinical Impressions(s) / ED Diagnoses   Final diagnoses:  Shortness of breath    ED Discharge Orders    None       Lonzo Candy, MD 01/03/19 1950    Isla Pence, MD 01/03/19 2242

## 2019-01-03 NOTE — Discharge Instructions (Addendum)

## 2019-01-03 NOTE — ED Notes (Signed)
Patient verbalizes understanding of discharge instructions. Opportunity for questioning and answers were provided. Armband removed by staff, pt discharged from ED.  

## 2019-01-05 LAB — NOVEL CORONAVIRUS, NAA (HOSP ORDER, SEND-OUT TO REF LAB; TAT 18-24 HRS): SARS-CoV-2, NAA: NOT DETECTED

## 2019-01-09 ENCOUNTER — Other Ambulatory Visit: Payer: Self-pay | Admitting: Cardiology

## 2019-01-09 MED ORDER — ISOSORBIDE MONONITRATE ER 60 MG PO TB24: 60.0000 mg | ORAL_TABLET | Freq: Every day | ORAL | 1 refills | Status: DC

## 2019-01-09 MED ORDER — CLOPIDOGREL BISULFATE 75 MG PO TABS: 75.0000 mg | ORAL_TABLET | Freq: Every day | ORAL | 0 refills | Status: DC

## 2019-01-09 NOTE — Telephone Encounter (Addendum)
°*  STAT* If patient is at the pharmacy, call can be transferred to refill team.   1. Which medications need to be refilled? (please list name of each medication and dose if known)  isosorbide mononitrate (IMDUR) 60 MG 24 hr tablet  clopidogrel (PLAVIX) 75 MG tablet    2. Which pharmacy/location (including street and city if local pharmacy) is medication to be sent to? [facility] 3. Do they need a 30 day or 90 day supply? 90 day

## 2019-01-13 ENCOUNTER — Other Ambulatory Visit: Payer: Self-pay | Admitting: *Deleted

## 2019-01-13 ENCOUNTER — Telehealth: Payer: Self-pay | Admitting: Cardiology

## 2019-01-13 MED ORDER — ASPIRIN EC 81 MG PO TBEC: 81.0000 mg | DELAYED_RELEASE_TABLET | Freq: Every day | ORAL | 1 refills | Status: DC

## 2019-01-13 NOTE — Telephone Encounter (Signed)
°*  STAT* If patient is at the pharmacy, call can be transferred to refill team.   1. Which medications need to be refilled? (please list name of each medication and dose if known) Aspirin EC tab 81mg  tablet once daily  2. Which pharmacy/location (including street and city if local pharmacy) is medication to be sent to? [facility]  3. Do they need a 30 day or 90 day supply? [place]

## 2019-01-13 NOTE — Telephone Encounter (Signed)
Aspirin refilled at [facility]

## 2019-02-10 ENCOUNTER — Other Ambulatory Visit: Payer: Self-pay

## 2019-02-10 MED ORDER — PANTOPRAZOLE SODIUM 40 MG PO TBEC: 40.0000 mg | DELAYED_RELEASE_TABLET | Freq: Every day | ORAL | 3 refills | Status: DC

## 2019-02-13 ENCOUNTER — Other Ambulatory Visit: Payer: Self-pay

## 2019-02-13 ENCOUNTER — Ambulatory Visit (INDEPENDENT_AMBULATORY_CARE_PROVIDER_SITE_OTHER): Payer: Medicare Other

## 2019-02-13 DIAGNOSIS — R0602 Shortness of breath: Secondary | ICD-10-CM

## 2019-02-13 DIAGNOSIS — I2510 Atherosclerotic heart disease of native coronary artery without angina pectoris: Secondary | ICD-10-CM | POA: Diagnosis not present

## 2019-02-13 DIAGNOSIS — I10 Essential (primary) hypertension: Secondary | ICD-10-CM | POA: Diagnosis not present

## 2019-02-13 NOTE — Progress Notes (Signed)
Complete echocardiogram has been performed.  Jimmy Gedeon Brandow RDCS, RVT 

## 2019-02-14 ENCOUNTER — Telehealth: Payer: Self-pay

## 2019-02-14 NOTE — Telephone Encounter (Signed)
-----   Message from Richardo Priest, MD sent at 02/13/2019  5:08 PM EDT ----- Normal or stable result  Good result echo is reassuring ejection fraction is normal.

## 2019-02-14 NOTE — Telephone Encounter (Signed)
Information relayed, no further questions.  

## 2019-02-24 ENCOUNTER — Ambulatory Visit: Payer: Medicare Other | Admitting: Cardiology

## 2019-03-10 NOTE — Progress Notes (Signed)
Cardiology Office Note:    Date:  03/11/2019   ID:  Brandy FinlandJanice Cervantes, DOB [birth year], MRN [id number]  PCP:  Kyla BalzarineMorehart, Jodi, PA-C  Cardiologist:  Norman HerrlichBrian Munley, MD    Referring MD: Kyla BalzarineMorehart, Jodi, PA-C    ASSESSMENT:    1. CAD in native artery   2. Essential hypertension   3. Mixed hyperlipidemia    PLAN:    In order of problems listed above:  1. Stable asymptomatic New York Heart Association class I continue her current treatment aspect oral nitrate well-tolerated no recurrent angina and after good discussion of benefit we will resume low-dose statin rosuvastatin 2 days/week we will check lipids next visit 2. Stable continue current treatment she has had no recurrent orthostatic hypotension or syncope 3. Poorly controlled resume statin   Next appointment: 6 months   Medication Adjustments/Labs and Tests Ordered: Current medicines are reviewed at length with the patient today.  Concerns regarding medicines are outlined above.  No orders of the defined types were placed in this encounter.  No orders of the defined types were placed in this encounter.   No chief complaint on file.   History of Present Illness:    Brandy Cervantes is a 73 y.o. 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  Her d-dimer was elevated and chest CTA showed no evidence of pulmonary embolism.  Lung parenchyma was abnormal with reactive airway disease representing asthma bronchiolitis cryptogenic organizing pneumonia or hypersensitivity pneumonitis.  Her test for COVID-19 was negative.  Echocardiogram showed normal left ventricular size function and no findings of significant valvular heart disease pulmonary hypertension or heart failure.  Compliance with diet, lifestyle and medications: No she stopped taking a statin she just did not understand it was important   Fortunately doing well no recurrent hypotension shortness of breath chest pain palpitation or syncope. Past Medical History:  Diagnosis Date  . Anxiety   . Arthritis    "all over" (02/01/2018)  . 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 Bilateral knee pain 10/28/2013  . Bilateral primary osteoarthritis of knee 11/17/2016  . Chronic back pain    "qwhere" (01/29/2018)  . Chronic pain 10/18/2015  . Degenerative disc disease, cervical 10/28/2013  . Degenerative disc disease, lumbar 10/28/2013  . Depression   . Esophageal dysphagia 12/31/2017  . Essential hypertension 10/18/2015  . Exposure to TB    "my mother died when she was [age] of TB"  . GAD (generalized anxiety disorder) 10/18/2015  . GERD (gastroesophageal reflux disease) 12/31/2017  . History of gout    "I've had it occasionally" (11/23/2016)  . Hypertension   . Insomnia 10/18/2015  . Migraine    "a few/year; more in the last few weeks" (01/29/2018)  . Mixed hyperlipidemia 10/19/2017  . Neck pain 10/28/2013  . Osteoarthritis 10/18/2015  . Osteoporosis 10/18/2015  . Other chest pain 12/31/2017  . Pain syndrome, chronic 10/28/2013  . Raynaud's disease    feet    Past Surgical History:  Procedure Laterality Date  . CARDIAC CATHETERIZATION    . CORONARY ANGIOPLASTY    . CORONARY STENT INTERVENTION N/A 01/29/2018   Procedure: CORONARY STENT INTERVENTION;  Surgeon: Lennette BihariKelly, Thomas A, MD;  [facility];  Service: Cardiovascular;  Laterality: N/A;  . JOINT REPLACEMENT    . LEFT HEART CATH AND CORONARY ANGIOGRAPHY N/A 01/29/2018  Procedure: LEFT HEART CATH AND CORONARY ANGIOGRAPHY;  Surgeon: Troy Sine, MD;  Location: [facility];  Service: Cardiovascular;  Laterality: N/A;  . LEFT HEART CATH AND CORS/GRAFTS ANGIOGRAPHY N/A 03/04/2018   Procedure: LEFT HEART CATH AND CORS/GRAFTS ANGIOGRAPHY;  Surgeon: Nelva Bush, MD;  Location: [place] [facility];  Service: Cardiovascular;  Laterality: N/A;  . MASTOIDECTOMY Right ~  1955  . TOTAL KNEE ARTHROPLASTY Bilateral 11/22/2016   Procedure: TOTAL KNEE BILATERAL;  Surgeon: Frederik Pear, MD;  Location: [facility];  Service: Orthopedics;  Laterality: Bilateral;    Current Medications: Current Meds  Medication Sig  . Ascorbic Acid (VITAMIN C) 1000 MG tablet Take 1,000 mg by mouth daily.  Marland Kitchen aspirin EC 81 MG tablet Take 1 tablet (81 mg total) by mouth daily.  Marland Kitchen b complex vitamins capsule Take 1 capsule by mouth daily.  . Cholecalciferol (VITAMIN D3) 1000 units CAPS Take 1,000 Units by mouth daily.  . fluticasone (FLONASE) 50 MCG/ACT nasal spray Place 2 sprays into both nostrils daily as needed for allergies.   Marland Kitchen HYDROcodone-acetaminophen (NORCO) 10-325 MG tablet Take 1 tablet by mouth every 6 (six) hours as needed for moderate pain.  . isosorbide mononitrate (IMDUR) 60 MG 24 hr tablet Take 1 tablet (60 mg total) by mouth daily.  Marland Kitchen morphine (MS CONTIN) 30 MG 12 hr tablet Take 30 mg by mouth every 12 (twelve) hours.  . Multiple Vitamins-Minerals (SENIOR MULTIVITAMIN PLUS) TABS Take 1 tablet by mouth daily.  . nitroGLYCERIN (NITROSTAT) 0.4 MG SL tablet Place 1 tablet (0.4 mg total) under the tongue every 5 (five) minutes x 3 doses as needed for chest pain.  Marland Kitchen OVER THE COUNTER MEDICATION Take 1 tablet by mouth at bedtime as needed (for sleep). Calms Forte  . pantoprazole (PROTONIX) 40 MG tablet Take 1 tablet (40 mg total) by mouth daily.  Marland Kitchen PARoxetine (PAXIL) 10 MG tablet Take 5 mg by mouth 2 (two) times daily.  Marland Kitchen SPIRIVA RESPIMAT 1.25 MCG/ACT AERS Inhale 2 puffs into the lungs daily.  . traZODone (DESYREL) 50 MG tablet Take 1 tablet by mouth at bedtime.     Allergies:   Aspirin, Lisinopril, Topiramate, Alendronate sodium, Celecoxib, Diphenhydramine hcl, Gabapentin, Nsaids, Pravastatin, and Sumatriptan   Social History   Socioeconomic History  . Marital status: Married    Spouse name: Not on file  . Number of children: Not on file  . Years of education: Not on file  .  Highest education level: Not on file  Occupational History  . Not on file  Social Needs  . Financial resource strain: Not on file  . Food insecurity    Worry: Not on file    Inability: Not on file  . Transportation needs    Medical: Not on file    Non-medical: Not on file  Tobacco Use  . Smoking status: Never Smoker  . Smokeless tobacco: Never Used  Substance and Sexual Activity  . Alcohol use: No  . Drug use: No  . Sexual activity: Not Currently  Lifestyle  . Physical activity    Days per week: Not on file    Minutes per session: Not on file  . Stress: Not on file  Relationships  . Social Herbalist on phone: Not on file    Gets together: Not on file    Attends religious service: Not on file    Active member of club or organization: Not on file    Attends meetings  of clubs or organizations: Not on file    Relationship status: Not on file  Other Topics Concern  . Not on file  Social History Narrative  . Not on file     Family History: The patient's family history includes Heart attack in her father; Heart disease in her brother; Hyperlipidemia in her father; Tuberculosis in her mother. ROS:   Please see the history of present illness.    All other systems reviewed and are negative.  EKGs/Labs/Other Studies Reviewed:    The following studies were reviewed today:  Recent Labs: 07/24/2018: ALT 29 01/02/2019: BUN 10; Creatinine, Ser 0.67; Hemoglobin 14.8; NT-Pro BNP 332; Platelets 259; Potassium 4.4; Sodium 141  Recent Lipid Panel    Component Value Date/Time   CHOL 141 06/10/2018 1456   TRIG 134 06/10/2018 1456   HDL 54 06/10/2018 1456   CHOLHDL 2.6 06/10/2018 1456   CHOLHDL 4.5 01/28/2018 0326   VLDL 42 (H) 01/28/2018 0326   LDLCALC 60 06/10/2018 1456    Physical Exam:    VS:  BP 118/84 (BP Location: Left Arm, Patient Position: Sitting, Cuff Size: Normal)   Pulse 80   Ht 5' 3.5" (1.613 m)   Wt 142 lb 3.2 oz (64.5 kg)   SpO2 98%   BMI 24.79  kg/m     Wt Readings from Last 3 Encounters:  03/11/19 142 lb 3.2 oz (64.5 kg)  01/03/19 141 lb 12.8 oz (64.3 kg)  01/02/19 141 lb 12.8 oz (64.3 kg)     GEN:  Well nourished, well developed in no acute distress HEENT: Normal NECK: No JVD; No carotid bruits LYMPHATICS: No lymphadenopathy CARDIAC: RRR, no murmurs, rubs, gallops RESPIRATORY:  Clear to auscultation without rales, wheezing or rhonchi  ABDOMEN: Soft, non-tender, non-distended MUSCULOSKELETAL:  No edema; No deformity  SKIN: Warm and dry NEUROLOGIC:  Alert and oriented x 3 PSYCHIATRIC:  Normal affect    Signed, Norman HerrlichBrian Munley, MD  03/11/2019 12:04 PM    [facility]

## 2019-03-11 ENCOUNTER — Encounter: Payer: Self-pay | Admitting: Cardiology

## 2019-03-11 ENCOUNTER — Other Ambulatory Visit: Payer: Self-pay

## 2019-03-11 ENCOUNTER — Ambulatory Visit (INDEPENDENT_AMBULATORY_CARE_PROVIDER_SITE_OTHER): Payer: Medicare Other | Admitting: Cardiology

## 2019-03-11 VITALS — BP 118/84 | HR 80 | Ht 63.5 in | Wt 142.2 lb

## 2019-03-11 DIAGNOSIS — E782 Mixed hyperlipidemia: Secondary | ICD-10-CM | POA: Diagnosis not present

## 2019-03-11 DIAGNOSIS — I2510 Atherosclerotic heart disease of native coronary artery without angina pectoris: Secondary | ICD-10-CM | POA: Diagnosis not present

## 2019-03-11 DIAGNOSIS — I10 Essential (primary) hypertension: Secondary | ICD-10-CM | POA: Diagnosis not present

## 2019-03-11 MED ORDER — ROSUVASTATIN CALCIUM 20 MG PO TABS: 20.0000 mg | ORAL_TABLET | ORAL | 1 refills | Status: DC

## 2019-03-11 NOTE — Patient Instructions (Signed)
Medication Instructions:  Your physician has recommended you make the following change in your medication:   RESTART rosuvastatin (crestor) 20 mg: Take 1 tablet twice weekly  If you need a refill on your cardiac medications before your next appointment, please call your pharmacy.   Lab work: None  If you have labs (blood work) drawn today and your tests are completely normal, you will receive your results only by: Marland Kitchen MyChart Message (if you have MyChart) OR . A paper copy in the mail If you have any lab test that is abnormal or we need to change your treatment, we will call you to review the results.  Testing/Procedures: None  Follow-Up: At [facility], you and your health needs are our priority.  As part of our continuing mission to provide you with exceptional heart care, we have created designated Provider Care Teams.  These Care Teams include your primary Cardiologist (physician) and Advanced Practice Providers (APPs -  Physician Assistants and Nurse Practitioners) who all work together to provide you with the care you need, when you need it. You will need a follow up appointment in 6 months.  Please call our office 2 months in advance to schedule this appointment.

## 2019-03-19 ENCOUNTER — Other Ambulatory Visit: Payer: Self-pay | Admitting: Cardiology

## 2019-03-20 MED ORDER — CLOPIDOGREL BISULFATE 75 MG PO TABS: 75.0000 mg | ORAL_TABLET | Freq: Every day | ORAL | 1 refills | Status: DC

## 2019-03-20 MED ORDER — ISOSORBIDE MONONITRATE ER 60 MG PO TB24: 60.0000 mg | ORAL_TABLET | Freq: Every day | ORAL | 1 refills | Status: DC

## 2019-03-20 NOTE — Telephone Encounter (Signed)
Per Dr Bettina Gavia patient should be taking clopidogrel 75mg  one tablet daily.  Rx sent to pharmacy as requested.

## 2019-03-20 NOTE — Addendum Note (Signed)
Addended by: Stevan Born on: 03/20/2019 10:17 AM   Modules accepted: Orders

## 2019-07-30 DIAGNOSIS — J479 Bronchiectasis, uncomplicated: Secondary | ICD-10-CM

## 2019-07-30 HISTORY — DX: Bronchiectasis, uncomplicated: J47.9

## 2019-08-04 ENCOUNTER — Other Ambulatory Visit: Payer: Self-pay | Admitting: Cardiology

## 2019-08-04 MED ORDER — NITROGLYCERIN 0.4 MG SL SUBL: 0.4000 mg | SUBLINGUAL_TABLET | SUBLINGUAL | 2 refills | Status: DC | PRN

## 2019-08-07 ENCOUNTER — Telehealth: Payer: Self-pay | Admitting: Cardiology

## 2019-08-07 NOTE — Telephone Encounter (Signed)
Pt c/o of Chest Pain: STAT if CP now or developed within 24 hours  1. Are you having CP right now? No  2. Are you experiencing any other symptoms (ex. SOB, nausea, vomiting, sweating)? No  3. How long have you been experiencing CP? Past few hours  4. Is your CP continuous or coming and going? Coming and going  5. Have you taken Nitroglycerin? Yes   Patient's husband, Chrissie Noa, states pharmacy has not yet received prescription refill order. He states the remaining Nitroglycerin medication is expired and therefore medication is no longer effective. Please call.   *STAT* If patient is at the pharmacy, call can be transferred to refill team.   1. Which medications need to be refilled? (please list name of each medication and dose if known) nitroGLYCERIN (NITROSTAT) 0.4 MG SL tablet [[id number]]  2. Which pharmacy/location (including street and city if local pharmacy) is medication to be sent to? [facility] - [place] - [address]  [address] [place], [place] [address]   3. Do they need a 30 day or 90 day supply? 30 day supply  ?

## 2019-08-07 NOTE — Telephone Encounter (Addendum)
LMTCB ....prescription for Nitro was sent in on 08/04/19.

## 2019-08-07 NOTE — Telephone Encounter (Signed)
Spoke with the pt and she has been able to pick up her Nitro.. she has chest pressure occassionally and she says it usually feels better after one nitro and after relaxing. She denies dizziness, sob.Marland Kitchen and says this is not new for her and it does not happen very often. Pt to call back if anything worsens so she can be seen and if the pressure worsens after hours or anytime she can always call EMS or go to the ED.   Pt verbalized understanding and agrees and says she does not need to be seen at this time.   Will forward to Dr. Dulce Sellar for his review and knowledge.

## 2019-08-08 ENCOUNTER — Other Ambulatory Visit: Payer: Self-pay

## 2019-08-08 ENCOUNTER — Emergency Department (HOSPITAL_COMMUNITY): Payer: Medicare Other

## 2019-08-08 ENCOUNTER — Telehealth: Payer: Self-pay | Admitting: Cardiology

## 2019-08-08 ENCOUNTER — Emergency Department (HOSPITAL_COMMUNITY)
Admission: EM | Admit: 2019-08-08 | Discharge: 2019-08-08 | Disposition: A | Discharge status: Home / Self Care | Payer: Medicare Other | Attending: Emergency Medicine | Admitting: Emergency Medicine

## 2019-08-08 DIAGNOSIS — I25110 Atherosclerotic heart disease of native coronary artery with unstable angina pectoris: Secondary | ICD-10-CM | POA: Diagnosis not present

## 2019-08-08 DIAGNOSIS — Z79899 Other long term (current) drug therapy: Secondary | ICD-10-CM | POA: Diagnosis not present

## 2019-08-08 DIAGNOSIS — R079 Chest pain, unspecified: Secondary | ICD-10-CM

## 2019-08-08 DIAGNOSIS — Z96653 Presence of artificial knee joint, bilateral: Secondary | ICD-10-CM | POA: Insufficient documentation

## 2019-08-08 DIAGNOSIS — I10 Essential (primary) hypertension: Secondary | ICD-10-CM | POA: Insufficient documentation

## 2019-08-08 DIAGNOSIS — Z7902 Long term (current) use of antithrombotics/antiplatelets: Secondary | ICD-10-CM | POA: Insufficient documentation

## 2019-08-08 DIAGNOSIS — R0789 Other chest pain: Secondary | ICD-10-CM | POA: Insufficient documentation

## 2019-08-08 DIAGNOSIS — Z7982 Long term (current) use of aspirin: Secondary | ICD-10-CM | POA: Insufficient documentation

## 2019-08-08 LAB — BASIC METABOLIC PANEL
Anion gap: 10 (ref 5–15)
BUN: 8 mg/dL (ref 8–23)
CO2: 25 mmol/L (ref 22–32)
Calcium: 9.2 mg/dL (ref 8.9–10.3)
Chloride: 106 mmol/L (ref 98–111)
Creatinine, Ser: 0.73 mg/dL (ref 0.44–1.00)
GFR calc Af Amer: >60 mL/min (ref >60)
GFR calc non Af Amer: >60 mL/min (ref >60)
Glucose, Bld: 119 mg/dL — ABNORMAL HIGH (ref 70–99)
Potassium: 4.2 mmol/L (ref 3.5–5.1)
Sodium: 141 mmol/L (ref 135–145)

## 2019-08-08 LAB — CBC
HCT: 46 % (ref 36.0–46.0)
Hemoglobin: 14.8 g/dL (ref 12.0–15.0)
MCH: 30.5 pg (ref 26.0–34.0)
MCHC: 32.2 g/dL (ref 30.0–36.0)
MCV: 94.7 fL (ref 80.0–100.0)
Platelets: 297 10*3/uL (ref 150–400)
RBC: 4.86 MIL/uL (ref 3.87–5.11)
RDW: 12.5 % (ref 11.5–15.5)
WBC: 7 10*3/uL (ref 4.0–10.5)
nRBC: 0 % (ref 0.0–0.2)

## 2019-08-08 LAB — TROPONIN I (HIGH SENSITIVITY)
Troponin I (High Sensitivity): <2 ng/L (ref <18)
Troponin I (High Sensitivity): 3 ng/L (ref <18)

## 2019-08-08 MED ORDER — SODIUM CHLORIDE 0.9% FLUSH: 3.0000 mL | Freq: Once | INTRAVENOUS | Status: DC

## 2019-08-08 NOTE — Discharge Instructions (Addendum)
The tests today were normal.  Please follow up with your cardiologist for further evaluation as we discussed.  Return to the ED for worsening symptoms

## 2019-08-08 NOTE — ED Notes (Signed)
Pt d/c home per MD order. Discharge summary reviewed with pt, pt verbalizes understanding. Ambulatory. Reports discharge ride home.

## 2019-08-08 NOTE — ED Triage Notes (Signed)
Pt here from home with c/o chest pain off and on for 4 days , pt has taken 2 nitro today and  Yesterday  History of 1 stent

## 2019-08-08 NOTE — Telephone Encounter (Signed)
Patient was sent to me by phone from [facility] and she is having constant chest pain and took 2 nitro yesterday and it did not help. I told her that if she is having active chest pain to go to the [facility] emergency room and she agreed she would procedd to go to the ER at [facility]. A triage call was placed by the Telephone team in [place]. She was feeling bad and I explained that if something was going on it would be better to go to Er so they may start meds if anything serious. She agreed. Informed her Rn would triage call also.Marland Kitchen

## 2019-08-08 NOTE — ED Notes (Signed)
EDP at bedside  

## 2019-08-08 NOTE — ED Provider Notes (Signed)
[facility] EMERGENCY DEPARTMENT Provider Note   CSN: [id number] Arrival date & time: 08/08/19  1220     History No chief complaint on file.   Brandy Cervantes is a 74 y.o. female.  HPI Pt states she has been having chest pain for the last 4 days.  The ache in her chest has been constant.  SHe has some episodes where it is more intense.  She has been taking NTG without relief.  Today it was more sharp.  SHe has history of CAD.  SHe called her doctor who instructed her to come to the ED. HPI: A 74 year old patient with a history of hypercholesterolemia presents for evaluation of chest pain. Initial onset of pain was approximately 3-6 hours ago. The patient's chest pain is described as heaviness/pressure/tightness, is sharp and is not worse with exertion. The patient's chest pain is middle- or left-sided, is not well-localized and does not radiate to the arms/jaw/neck. The patient does not complain of nausea and denies diaphoresis. The patient has no history of stroke, has no history of peripheral artery disease, has not smoked in the past 90 days, denies any history of treated diabetes, has no relevant family history of coronary artery disease (first degree relative at less than age 95), is not hypertensive and does not have an elevated BMI (>=30).   Past Medical History:  Diagnosis Date  . Anxiety   . Arthritis    "all over" (02/01/2018)  . 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 Bilateral knee pain 10/28/2013  . Bilateral primary osteoarthritis of knee 11/17/2016  . Chronic back pain    "qwhere" (01/29/2018)  . Chronic pain 10/18/2015  . Degenerative disc disease, cervical 10/28/2013  . Degenerative disc disease, lumbar 10/28/2013  . Depression   . Esophageal dysphagia 12/31/2017  . Essential hypertension 10/18/2015  . Exposure to TB    "my mother died when she was 65 of TB"  . GAD (generalized anxiety disorder) 10/18/2015  . GERD (gastroesophageal reflux disease)  12/31/2017  . History of gout    "I've had it occasionally" (11/23/2016)  . Hypertension   . Insomnia 10/18/2015  . Migraine    "a few/year; more in the last few weeks" (01/29/2018)  . Mixed hyperlipidemia 10/19/2017  . Neck pain 10/28/2013  . Osteoarthritis 10/18/2015  . Osteoporosis 10/18/2015  . Other chest pain 12/31/2017  . Pain syndrome, chronic 10/28/2013  . Raynaud's disease    feet    Patient Active Problem List   Diagnosis Date Noted  . Fatigue 01/02/2019  . Hypotension 07/24/2018  . Shortness of breath 07/24/2018  . Chest pain 03/03/2018  . CAD in native artery 02/04/2018  . Raynaud's disease   . Unstable angina (HCC) 01/27/2018  . Abnormal EKG 01/24/2018  . Esophageal dysphagia 12/31/2017  . GERD (gastroesophageal reflux disease) 12/31/2017  . Other chest pain 12/31/2017  . Mixed hyperlipidemia 10/19/2017  . Bilateral primary osteoarthritis of knee 11/17/2016  . 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 Chronic pain 10/18/2015  . Essential hypertension 10/18/2015  . GAD (generalized anxiety disorder) 10/18/2015  . Insomnia 10/18/2015  . Osteoarthritis 10/18/2015  . Osteoporosis 10/18/2015  . Bilateral knee pain 10/28/2013  . Degenerative disc disease, cervical 10/28/2013  . Degenerative disc disease, lumbar 10/28/2013  . Low back pain 10/28/2013  . Neck pain 10/28/2013  . Pain syndrome, chronic 10/28/2013    Past Surgical History:  Procedure Laterality Date  . CARDIAC CATHETERIZATION    . CORONARY ANGIOPLASTY    .  CORONARY STENT INTERVENTION N/A 01/29/2018   Procedure: CORONARY STENT INTERVENTION;  Surgeon: Lennette Bihari, MD;  Location: [facility] INVASIVE CV LAB;  Service: Cardiovascular;  Laterality: N/A;  . JOINT REPLACEMENT    . LEFT HEART CATH AND CORONARY ANGIOGRAPHY N/A 01/29/2018   Procedure: LEFT HEART CATH AND CORONARY ANGIOGRAPHY;  Surgeon: Lennette Bihari, MD;  Location: [facility] INVASIVE CV LAB;  Service: Cardiovascular;  Laterality: N/A;  . LEFT HEART CATH  AND CORS/GRAFTS ANGIOGRAPHY N/A 03/04/2018   Procedure: LEFT HEART CATH AND CORS/GRAFTS ANGIOGRAPHY;  Surgeon: [name], MD;  Location: [facility] INVASIVE CV LAB;  Service: Cardiovascular;  Laterality: N/A;  . MASTOIDECTOMY Right ~ 1955  . TOTAL KNEE ARTHROPLASTY Bilateral 11/22/2016   Procedure: TOTAL KNEE BILATERAL;  Surgeon: Gean Birchwood, MD;  Location: [facility] OR;  Service: Orthopedics;  Laterality: Bilateral;     OB History   No obstetric history on file.     Family History  Problem Relation Age of Onset  . Tuberculosis Mother   . Heart attack Father   . Hyperlipidemia Father   . Heart disease Brother     Social History   Tobacco Use  . Smoking status: Never Smoker  . Smokeless tobacco: Never Used  Substance Use Topics  . Alcohol use: No  . Drug use: No    Home Medications Prior to Admission medications   Medication Sig Start Date End Date Taking? Authorizing Provider  Ascorbic Acid (VITAMIN C) 1000 MG tablet Take 1,000 mg by mouth daily.    [provider]  aspirin EC 81 MG tablet Take 1 tablet (81 mg total) by mouth daily. 01/13/19   Baldo Daub, MD  b complex vitamins capsule Take 1 capsule by mouth daily.    [provider]  Cholecalciferol (VITAMIN D3) 1000 units CAPS Take 1,000 Units by mouth daily.    [provider]  clopidogrel (PLAVIX) 75 MG tablet Take 1 tablet (75 mg total) by mouth daily. 03/20/19   Baldo Daub, MD  fluticasone (FLONASE) 50 MCG/ACT nasal spray Place 2 sprays into both nostrils daily as needed for allergies.  08/04/13   [provider]  HYDROcodone-acetaminophen (NORCO) 10-325 MG tablet Take 1 tablet by mouth every 6 (six) hours as needed for moderate pain.    [provider]  isosorbide mononitrate (IMDUR) 60 MG 24 hr tablet Take 1 tablet (60 mg total) by mouth daily. 03/20/19   Baldo Daub, MD  morphine (MS CONTIN) 30 MG 12 hr tablet Take 30 mg by mouth every 12 (twelve) hours.    [provider]  Multiple Vitamins-Minerals (SENIOR MULTIVITAMIN PLUS) TABS Take 1 tablet by mouth daily.    [provider]  nitroGLYCERIN (NITROSTAT) 0.4 MG SL tablet Place 1 tablet (0.4 mg total) under the tongue every 5 (five) minutes x 3 doses as needed for chest pain. 08/04/19   Baldo Daub, MD  OVER THE COUNTER MEDICATION Take 1 tablet by mouth at bedtime as needed (for sleep). Calms Forte    [provider]  pantoprazole (PROTONIX) 40 MG tablet Take 1 tablet (40 mg total) by mouth daily. 02/10/19   [facility], Roe Rutherford, PA  PARoxetine (PAXIL) 10 MG tablet Take 5 mg by mouth 2 (two) times daily. 08/22/16   [provider]  rosuvastatin (CRESTOR) 20 MG tablet Take 1 tablet (20 mg total) by mouth 2 (two) times a week. 03/13/19 03/12/20  Baldo Daub, MD  SPIRIVA RESPIMAT 1.25 MCG/ACT AERS Inhale  2 puffs into the lungs daily. 01/29/19   [provider]  traZODone (DESYREL) 50 MG tablet Take 1 tablet by mouth at bedtime.    [provider]    Allergies    Aspirin, Lisinopril, Topiramate, Alendronate sodium, Celecoxib, Diphenhydramine hcl, Gabapentin, Nsaids, Pravastatin, and Sumatriptan  Review of Systems   Review of Systems  All other systems reviewed and are negative.   Physical Exam Updated Vital Signs BP (!) 145/89   Pulse (!) 54   Temp 98.4 F (36.9 C) (Oral)   Resp 16   SpO2 96%   Physical Exam Vitals and nursing note reviewed.  Constitutional:      General: She is not in acute distress.    Appearance: She is well-developed.  HENT:     Head: Normocephalic and atraumatic.     Right Ear: External ear normal.     Left Ear: External ear normal.  Eyes:     General: No scleral icterus.       Right eye: No discharge.        Left eye: No discharge.     Conjunctiva/sclera: Conjunctivae normal.  Neck:     Trachea: No tracheal deviation.  Cardiovascular:     Rate and Rhythm: Normal rate and regular rhythm.  Pulmonary:     Effort:  Pulmonary effort is normal. No respiratory distress.     Breath sounds: Normal breath sounds. No stridor. No wheezing or rales.  Abdominal:     General: Bowel sounds are normal. There is no distension.     Palpations: Abdomen is soft.     Tenderness: There is no abdominal tenderness. There is no guarding or rebound.  Musculoskeletal:        General: No tenderness.     Cervical back: Neck supple.  Skin:    General: Skin is warm and dry.     Findings: No rash.  Neurological:     Mental Status: She is alert.     Cranial Nerves: No cranial nerve deficit (no facial droop, extraocular movements intact, no slurred speech).     Sensory: No sensory deficit.     Motor: No abnormal muscle tone or seizure activity.     Coordination: Coordination normal.     ED Results / Procedures / Treatments   Labs (all labs ordered are listed, but only abnormal results are displayed) Labs Reviewed  BASIC METABOLIC PANEL - Abnormal; Notable for the following components:      Result Value   Glucose, Bld 119 (*)    All other components within normal limits  CBC  TROPONIN I (HIGH SENSITIVITY)  TROPONIN I (HIGH SENSITIVITY)    EKG EKG Interpretation  Date/Time:  Friday August 08 2019 12:22:12 EST Ventricular Rate:  73 PR Interval:  138 QRS Duration: 76 QT Interval:  396 QTC Calculation: 436 R Axis:   71 Text Interpretation: Normal sinus rhythm Normal ECG No significant change since last tracing Confirmed by Dorie Rank [phone]) on 08/08/2019 12:57:23 PM   Radiology DG Chest 2 View  Result Date: 08/08/2019 CLINICAL DATA:  Chest pain, shortness of breath. EXAM: CHEST - 2 VIEW COMPARISON:  January 02, 2019. FINDINGS: The heart size and mediastinal contours are within normal limits. Both lungs are clear. No pneumothorax or pleural effusion is noted. The visualized skeletal structures are unremarkable. IMPRESSION: No active cardiopulmonary disease. Electronically Signed   By: Marijo Conception M.D.   On:  08/08/2019 13:02    Procedures Procedures (including critical care  time)  Medications Ordered in ED Medications  sodium chloride flush (NS) 0.9 % injection 3 mL (has no administration in time range)    ED Course  I have reviewed the triage vital signs and the nursing notes.  Pertinent labs & imaging results that were available during my care of the patient were reviewed by me and considered in my medical decision making (see chart for details).    MDM Rules/Calculators/A&P HEAR Score: 4                    Patient presented to ED with complaints of chest pain.  She has a history of coronary artery disease with prior stents.  Heart score moderate risk.  Serial troponins fortunately are normal.  Considering the fact that the symptoms have been ongoing for 4 days I am reassured by her normal blood tests.  I doubt that her symptoms are related to acute coronary syndrome.  Patient appears stable for discharge and we discussed close cardiology follow-up.  Warning signs precautions discussed. Final Clinical Impression(s) / ED Diagnoses Final diagnoses:  Chest pain, unspecified type    Rx / DC Orders ED Discharge Orders    None       Linwood Dibbles, MD 08/08/19 1557

## 2019-08-08 NOTE — Telephone Encounter (Signed)
Pt c/o of Chest Pain: STAT if CP now or developed within 24 hours  1. Are you having CP right now?  Chest Pressure- having it right now  2. Are you experiencing any other symptoms (ex. SOB, nausea, vomiting, sweating)? Not at this moment- have shortness of breath when she bends over  3. How long have you been experiencing CP?  This is the 3rd day  4. Is your CP continuous or coming and going?pressure  constantly   5. Have you taken Nitroglycerin? Took it 2 times yesterday, it did not relief pressure ?

## 2019-09-09 ENCOUNTER — Other Ambulatory Visit: Payer: Self-pay | Admitting: Cardiology

## 2019-10-07 NOTE — Progress Notes (Signed)
Cardiology Office Note:    Date:  10/08/2019   ID:  Brandy Cervantes, DOB [birth year], MRN [id number]  PCP:  Kyla Balzarine, PA-C  Cardiologist:  Norman Herrlich, MD    Referring MD: Kyla Balzarine, PA-C    ASSESSMENT:    1. CAD in native artery   2. Mixed hyperlipidemia    PLAN:    In order of problems listed above:  1. Stable CAD she had follow-up coronary angiography after PCI will continue to repeat ischemia evaluation she will continue her medical treatment with a dual antiplatelet and lipid-lowering treatment.  She also takes oral nitrates and statin intolerant of beta-blockers will continue. 2. Stable continue her high intensity statin check lipid profile K and liver function for safety today   Next appointment: Next months   Medication Adjustments/Labs and Tests Ordered: Current medicines are reviewed at length with the patient today.  Concerns regarding medicines are outlined above.  Orders Placed This Encounter  Procedures  . Comprehensive metabolic panel  . Lipid panel   No orders of the defined types were placed in this encounter.   No chief complaint on file.   History of Present Illness:    Brandy Cervantes is a 74 y.o. 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 She was seen 01/02/2019 for SOB.  Her d-dimer was elevated and chest CTA showed no evidence of pulmonary embolism.  Lung parenchyma was abnormal with reactive airway disease representing asthma bronchiolitis cryptogenic organizing pneumonia or hypersensitivity pneumonitis.  Her test for COVID-19 was negative.  Echocardiogram showed normal left ventricular size function and no findings of significant valvular heart disease pulmonary hypertension or heart failure.  She last seen 03/11/2019. Compliance with diet, lifestyle and medications: Yes She was seen emergency room 08/07/2018, chest pain  high-sensitivity troponin baseline repeat were normal EKG personally reviewed showed sinus rhythm no ST elevation or T wave inversion.  2 mo ago  (08/08/19) 2 mo ago  (08/08/19)   Troponin I (High Sensitivity) <18 ng/L 3  <2 CM   I reviewed her ED results with patient.  She has had no further chest pain.  She has stress at home with concerns mammogram with her who is in a transition phase of life.  No further angina dyspnea palpitation or syncope. Past Medical History:  Diagnosis Date  . Abnormal EKG 01/24/2018  . Anxiety   . Arthritis    "all over" (02/01/2018)  . 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 Bilateral knee pain 10/28/2013  . Bilateral primary osteoarthritis of knee 11/17/2016  . Bronchiectasis without complication (HCC) 07/30/2019   Formatting of this note might be different from the original. Chest CT 06/20/19  . CAD in native artery 02/04/2018  . Cellulitis of toe, right 09/20/2018  . Chest pain 03/03/2018  . Chronic back pain    "qwhere" (01/29/2018)  . Chronic pain 10/18/2015  . Corns of multiple toes 09/20/2018  . Degenerative disc disease, cervical 10/28/2013  . Degenerative disc disease, lumbar 10/28/2013  . Depression   . Esophageal dysphagia 12/31/2017  . Essential hypertension 10/18/2015  . Exposure to TB    "my mother died when she was 23 of TB"  . Fatigue 01/02/2019  . GAD (generalized anxiety disorder) 10/18/2015  . GERD (gastroesophageal reflux disease) 12/31/2017  . History of gout    "I've had it occasionally" (11/23/2016)  . Hypertension   .  Hypotension 07/24/2018  . Insomnia 10/18/2015  . Low back pain 10/28/2013  . Migraine    "a few/year; more in the last few weeks" (01/29/2018)  . Mixed hyperlipidemia 10/19/2017  . Neck pain 10/28/2013  . Osteoarthritis 10/18/2015  . Osteoporosis 10/18/2015  . Other chest pain 12/31/2017  . Pain syndrome, chronic 10/28/2013  . Raynaud's disease    feet  . Shortness of breath 07/24/2018  . Tick bite 11/21/2018   Formatting of this note  might be different from the original. 2020: right flank  . Unstable angina (HCC) 01/27/2018    Past Surgical History:  Procedure Laterality Date  . CARDIAC CATHETERIZATION    . CORONARY ANGIOPLASTY    . CORONARY STENT INTERVENTION N/A 01/29/2018   Procedure: CORONARY STENT INTERVENTION;  Surgeon: Lennette Bihari, MD;  Location: [facility] INVASIVE CV LAB;  Service: Cardiovascular;  Laterality: N/A;  . JOINT REPLACEMENT    . LEFT HEART CATH AND CORONARY ANGIOGRAPHY N/A 01/29/2018   Procedure: LEFT HEART CATH AND CORONARY ANGIOGRAPHY;  Surgeon: Lennette Bihari, MD;  Location: [facility] INVASIVE CV LAB;  Service: Cardiovascular;  Laterality: N/A;  . LEFT HEART CATH AND CORS/GRAFTS ANGIOGRAPHY N/A 03/04/2018   Procedure: LEFT HEART CATH AND CORS/GRAFTS ANGIOGRAPHY;  Surgeon: [name], MD;  Location: [facility] INVASIVE CV LAB;  Service: Cardiovascular;  Laterality: N/A;  . MASTOIDECTOMY Right ~ 1955  . TOTAL KNEE ARTHROPLASTY Bilateral 11/22/2016   Procedure: TOTAL KNEE BILATERAL;  Surgeon: Gean Birchwood, MD;  Location: [facility] OR;  Service: Orthopedics;  Laterality: Bilateral;    Current Medications: Current Meds  Medication Sig  . Ascorbic Acid (VITAMIN C) 1000 MG tablet Take 1,000 mg by mouth daily.  Marland Kitchen aspirin EC 81 MG tablet Take 1 tablet (81 mg total) by mouth daily.  Marland Kitchen b complex vitamins capsule Take 1 capsule by mouth daily.  . Cholecalciferol (VITAMIN D3) 1000 units CAPS Take 1,000 Units by mouth daily.  . clopidogrel (PLAVIX) 75 MG tablet Take 1 tablet (75 mg total) by mouth daily.  . fluticasone (FLONASE) 50 MCG/ACT nasal spray Place 2 sprays into both nostrils daily as needed for allergies.   Marland Kitchen HYDROcodone-acetaminophen (NORCO) 10-325 MG tablet Take 1 tablet by mouth every 6 (six) hours as needed for moderate pain.  . isosorbide mononitrate (IMDUR) 60 MG 24 hr tablet Take 1 tablet (60 mg total) by mouth daily.  Marland Kitchen morphine (MS CONTIN) 30 MG 12 hr tablet Take 30 mg by mouth every 12 (twelve) hours.  .  Multiple Vitamins-Minerals (SENIOR MULTIVITAMIN PLUS) TABS Take 1 tablet by mouth daily.  . nitroGLYCERIN (NITROSTAT) 0.4 MG SL tablet Place 1 tablet (0.4 mg total) under the tongue every 5 (five) minutes x 3 doses as needed for chest pain.  Marland Kitchen OVER THE COUNTER MEDICATION Take 1 tablet by mouth at bedtime as needed (for sleep). Calms Forte  . pantoprazole (PROTONIX) 40 MG tablet Take 1 tablet (40 mg total) by mouth daily.  Marland Kitchen PARoxetine (PAXIL) 10 MG tablet Take 5 mg by mouth 2 (two) times daily.  . rosuvastatin (CRESTOR) 20 MG tablet TAKE 1 TABLET BY MOUTH 2 TIMES A WEEK  . SPIRIVA RESPIMAT 1.25 MCG/ACT AERS Inhale 2 puffs into the lungs daily.  . traZODone (DESYREL) 50 MG tablet Take 1 tablet by mouth at bedtime.     Allergies:   Aspirin, Lisinopril, Topiramate, Alendronate sodium, Celecoxib, Diphenhydramine hcl, Gabapentin, Nsaids, Pravastatin, and Sumatriptan   Social History   Socioeconomic History  . Marital status: Married  Spouse name: Not on file  . Number of children: Not on file  . Years of education: Not on file  . Highest education level: Not on file  Occupational History  . Not on file  Tobacco Use  . Smoking status: Never Smoker  . Smokeless tobacco: Never Used  Substance and Sexual Activity  . Alcohol use: No  . Drug use: No  . Sexual activity: Not Currently  Other Topics Concern  . Not on file  Social History Narrative  . Not on file   Social Determinants of Health   Financial Resource Strain:   . Difficulty of Paying Living Expenses:   Food Insecurity:   . Worried About Charity fundraiser in the Last Year:   . Arboriculturist in the Last Year:   Transportation Needs:   . Film/video editor (Medical):   Marland Kitchen Lack of Transportation (Non-Medical):   Physical Activity:   . Days of Exercise per Week:   . Minutes of Exercise per Session:   Stress:   . Feeling of Stress :   Social Connections:   . Frequency of Communication with Friends and Family:   .  Frequency of Social Gatherings with Friends and Family:   . Attends Religious Services:   . Active Member of Clubs or Organizations:   . Attends Archivist Meetings:   Marland Kitchen Marital Status:      Family History: The patient's family history includes Heart attack in her father; Heart disease in her brother; Hyperlipidemia in her father; Tuberculosis in her mother. ROS:   Please see the history of present illness.    All other systems reviewed and are negative.  EKGs/Labs/Other Studies Reviewed:    The following studies were reviewed today:   Recent Labs: 01/02/2019: NT-Pro BNP 332 08/08/2019: BUN 8; Creatinine, Ser 0.73; Hemoglobin 14.8; Platelets 297; Potassium 4.2; Sodium 141  Recent Lipid Panel    Component Value Date/Time   CHOL 141 06/10/2018 1456   TRIG 134 06/10/2018 1456   HDL 54 06/10/2018 1456   CHOLHDL 2.6 06/10/2018 1456   CHOLHDL 4.5 01/28/2018 0326   VLDL 42 (H) 01/28/2018 0326   LDLCALC 60 06/10/2018 1456    Physical Exam:    VS:  BP 126/62   Pulse 76   Ht 5' 3.5" (1.613 m)   Wt 147 lb 9.6 oz (67 kg)   SpO2 97%   BMI 25.74 kg/m     Wt Readings from Last 3 Encounters:  10/08/19 147 lb 9.6 oz (67 kg)  03/11/19 142 lb 3.2 oz (64.5 kg)  01/03/19 141 lb 12.8 oz (64.3 kg)     GEN:  Well nourished, well developed in no acute distress HEENT: Normal NECK: No JVD; No carotid bruits LYMPHATICS: No lymphadenopathy CARDIAC: RRR, no murmurs, rubs, gallops RESPIRATORY:  Clear to auscultation without rales, wheezing or rhonchi  ABDOMEN: Soft, non-tender, non-distended MUSCULOSKELETAL:  No edema; No deformity  SKIN: Warm and dry NEUROLOGIC:  Alert and oriented x 3 PSYCHIATRIC:  Normal affect    Signed, Shirlee More, MD  10/08/2019 2:42 PM    [facility]

## 2019-10-08 ENCOUNTER — Ambulatory Visit: Payer: Medicare Other | Admitting: Cardiology

## 2019-10-08 ENCOUNTER — Encounter (INDEPENDENT_AMBULATORY_CARE_PROVIDER_SITE_OTHER): Payer: Self-pay

## 2019-10-08 ENCOUNTER — Other Ambulatory Visit: Payer: Self-pay

## 2019-10-08 VITALS — BP 126/62 | HR 76 | Ht 63.5 in | Wt 147.6 lb

## 2019-10-08 DIAGNOSIS — E782 Mixed hyperlipidemia: Secondary | ICD-10-CM | POA: Diagnosis not present

## 2019-10-08 DIAGNOSIS — I2510 Atherosclerotic heart disease of native coronary artery without angina pectoris: Secondary | ICD-10-CM | POA: Diagnosis not present

## 2019-10-08 NOTE — Patient Instructions (Signed)
Medication Instructions:  Your physician recommends that you continue on your current medications as directed. Please refer to the Current Medication list given to you today.  *If you need a refill on your cardiac medications before your next appointment, please call your pharmacy*   Lab Work: Lipids & Cmp   If you have labs (blood work) drawn today and your tests are completely normal, you will receive your results only by: Marland Kitchen MyChart Message (if you have MyChart) OR . A paper copy in the mail If you have any lab test that is abnormal or we need to change your treatment, we will call you to review the results.   Testing/Procedures: None ordered   Follow-Up: At [facility], you and your health needs are our priority.  As part of our continuing mission to provide you with exceptional heart care, we have created designated Provider Care Teams.  These Care Teams include your primary Cardiologist (physician) and Advanced Practice Providers (APPs -  Physician Assistants and Nurse Practitioners) who all work together to provide you with the care you need, when you need it.  We recommend signing up for the patient portal called "MyChart".  Sign up information is provided on this After Visit Summary.  MyChart is used to connect with patients for Virtual Visits (Telemedicine).  Patients are able to view lab/test results, encounter notes, upcoming appointments, etc.  Non-urgent messages can be sent to your provider as well.   To learn more about what you can do with MyChart, go to [email or web address].    Your next appointment:   6 month(s)  The format for your next appointment:   In Person  Provider:   Norman Herrlich, MD   Other Instructions None

## 2019-10-09 LAB — COMPREHENSIVE METABOLIC PANEL
ALT: 21 IU/L (ref 0–32)
AST: 32 IU/L (ref 0–40)
Albumin/Globulin Ratio: 1.9 (ref 1.2–2.2)
Albumin: 4.2 g/dL (ref 3.7–4.7)
Alkaline Phosphatase: 101 IU/L (ref 39–117)
BUN/Creatinine Ratio: 24 (ref 12–28)
BUN: 17 mg/dL (ref 8–27)
Bilirubin Total: 0.3 mg/dL (ref 0.0–1.2)
CO2: 23 mmol/L (ref 20–29)
Calcium: 9.2 mg/dL (ref 8.7–10.3)
Chloride: 103 mmol/L (ref 96–106)
Creatinine, Ser: 0.72 mg/dL (ref 0.57–1.00)
GFR calc Af Amer: 96 mL/min/{1.73_m2} (ref >59)
GFR calc non Af Amer: 83 mL/min/{1.73_m2} (ref >59)
Globulin, Total: 2.2 g/dL (ref 1.5–4.5)
Glucose: 79 mg/dL (ref 65–99)
Potassium: 4.4 mmol/L (ref 3.5–5.2)
Sodium: 141 mmol/L (ref 134–144)
Total Protein: 6.4 g/dL (ref 6.0–8.5)

## 2019-10-09 LAB — LIPID PANEL
Chol/HDL Ratio: 3.2 ratio (ref 0.0–4.4)
Cholesterol, Total: 180 mg/dL (ref 100–199)
HDL: 57 mg/dL (ref >39)
LDL Chol Calc (NIH): 92 mg/dL (ref 0–99)
Triglycerides: 184 mg/dL — ABNORMAL HIGH (ref 0–149)
VLDL Cholesterol Cal: 31 mg/dL (ref 5–40)

## 2019-12-16 ENCOUNTER — Telehealth: Payer: Self-pay | Admitting: Cardiology

## 2019-12-16 NOTE — Telephone Encounter (Signed)
Pt c/o medication issue:  1. Name of Medication: pantoprazole (PROTONIX) 40 MG tablet  2. How are you currently taking this medication (dosage and times per day)? As directed  3. Are you having a reaction (difficulty breathing--STAT)? No  4. What is your medication issue? Patient states she has been experiencing dizziness and headaches and she assumes the medication is the cause of these symptoms. Patient is requesting to take an alternative medication that will not cause symptoms. Please call.    STAT if patient feels like he/she is going to faint   1) Are you dizzy now? No  2) Do you feel faint or have you passed out? No  3) Do you have any other symptoms? Headaches  4) Have you checked your HR and BP (record if available)?  BP: 123/80

## 2019-12-17 NOTE — Telephone Encounter (Signed)
Spoke to the patient just now and let her know that Dr. Servando Salina is recommending for her PCP to manage her transition from Protonix to another medication. She verbalizes understanding and does not have any other issues or concerns at this time.    Encouraged patient to call back with any questions or concerns.

## 2019-12-17 NOTE — Telephone Encounter (Signed)
Please let the patient know that I would prefer she had her PCP  Change her Protonix. I agree if his making her dizzy not to take it but she will need another PPI

## 2019-12-22 DIAGNOSIS — R6889 Other general symptoms and signs: Secondary | ICD-10-CM

## 2019-12-22 HISTORY — DX: Other general symptoms and signs: R68.89

## 2020-01-06 DIAGNOSIS — R630 Anorexia: Secondary | ICD-10-CM | POA: Insufficient documentation

## 2020-01-06 HISTORY — DX: Anorexia: R63.0

## 2020-01-08 DIAGNOSIS — F32A Depression, unspecified: Secondary | ICD-10-CM

## 2020-01-08 DIAGNOSIS — F419 Anxiety disorder, unspecified: Secondary | ICD-10-CM | POA: Insufficient documentation

## 2020-01-08 DIAGNOSIS — F3340 Major depressive disorder, recurrent, in remission, unspecified: Secondary | ICD-10-CM | POA: Insufficient documentation

## 2020-01-08 HISTORY — DX: Major depressive disorder, recurrent, in remission, unspecified: F33.40

## 2020-01-08 HISTORY — DX: Anxiety disorder, unspecified: F41.9

## 2020-01-08 HISTORY — DX: Depression, unspecified: F32.A

## 2020-02-02 ENCOUNTER — Telehealth: Payer: Self-pay | Admitting: Cardiology

## 2020-02-02 NOTE — Telephone Encounter (Signed)
Pt c/o of Chest Pain: STAT if CP now or developed within 24 hours  1. Are you having CP right now? no  2. Are you experiencing any other symptoms (ex. SOB, nausea, vomiting, sweating)? no  3. How long have you been experiencing CP? ~ 1 day  4. Is your CP continuous or coming and going? Coming and going. Pt said it was a quick, sharp pain that felt like a dart was being thrown at her chest. It was a quick sharp pain.   5. Have you taken Nitroglycerin? Yes.   Pt had this happen 4x yesterday and did not take any nitro. Pt had this pain 3x 4 hrs later and took two and her CP settled down. An hour later the pain came back and pt took two more Nitroglycerin. Pt took a total of 4 Nitroglycerin     Pt was told by her [place] to call EMS or go to the ER. The EMS came to her house and took her vitals and everything was fine. She did not feel it was necessary to go to the hospital. She just wanted to make Dr. Dulce Sellar aware of what happened

## 2020-02-02 NOTE — Telephone Encounter (Signed)
Pt had some sharp shooting chest pain a few times yesterday. It was short lived each time but was coming and going.   Pt had this happen 4x yesterday and did not take any nitro. Pt had this pain 3x 4 hrs later and took two and her CP settled down. An hour later the pain came back and pt took two more Nitroglycerin. Pt took a total of 4 Nitroglycerin    Pt was told by her [place] to call EMS or go to the ER. The EMS came to her house and took her vitals and everything was fine. She did not feel it was necessary to go to the hospital. She just wanted to make Dr. Dulce Sellar aware of what happened   EMS told her it may be from indigestion or dehydration. Pt admits that she was very dehydrated and upped her water intake and feels much better today. She denies any CP, SOB, swelling or N/V since the incident yesterday. She is feeling much better today.   I advised patient that if the pain comes back, she should call EMS. She verbalized understanding.

## 2020-03-11 ENCOUNTER — Other Ambulatory Visit: Payer: Self-pay

## 2020-03-11 NOTE — Telephone Encounter (Signed)
Can you please contact the pt as well, she called very upset about this matter. Thanks

## 2020-03-11 NOTE — Telephone Encounter (Signed)
Refill request from [facility] for Protonix 40 mg denied. Per note from Dr Servando Salina patient instructed to contact primary care. Last refill was sent by Marcelino Duster, PA.  Message sent to pharmacy for patient as well.

## 2020-03-11 NOTE — Telephone Encounter (Signed)
Pt calling requesting a refill on pantoprazole 40 mg tablet. 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 Dulce Sellar

## 2020-03-12 ENCOUNTER — Telehealth: Payer: Self-pay | Admitting: *Deleted

## 2020-03-12 NOTE — Telephone Encounter (Signed)
*  STAT* If patient is at the pharmacy, call can be transferred to refill team.   1. Which medications need to be refilled? (please list name of each medication and dose if known) Pantoprazole 40 mg  2. Which pharmacy/location (including street and city if local pharmacy) is medication to be sent to?[facility]  3. Do they need a 30 day or 90 day supply? 90

## 2020-03-12 NOTE — Telephone Encounter (Signed)
Refill for Pantoprazole denied. Note for pt to contact PCP for this medication.

## 2020-03-15 ENCOUNTER — Other Ambulatory Visit: Payer: Self-pay | Admitting: Cardiology

## 2020-03-15 NOTE — Telephone Encounter (Signed)
Rx refill sent to pharmacy. 

## 2020-03-31 ENCOUNTER — Other Ambulatory Visit: Payer: Self-pay | Admitting: Cardiology

## 2020-04-02 ENCOUNTER — Other Ambulatory Visit: Payer: Self-pay

## 2020-04-02 DIAGNOSIS — I10 Essential (primary) hypertension: Secondary | ICD-10-CM | POA: Insufficient documentation

## 2020-04-02 DIAGNOSIS — G8929 Other chronic pain: Secondary | ICD-10-CM | POA: Insufficient documentation

## 2020-04-02 DIAGNOSIS — Z201 Contact with and (suspected) exposure to tuberculosis: Secondary | ICD-10-CM | POA: Insufficient documentation

## 2020-04-02 DIAGNOSIS — M549 Dorsalgia, unspecified: Secondary | ICD-10-CM | POA: Insufficient documentation

## 2020-04-02 DIAGNOSIS — G43909 Migraine, unspecified, not intractable, without status migrainosus: Secondary | ICD-10-CM | POA: Insufficient documentation

## 2020-04-02 DIAGNOSIS — Z8739 Personal history of other diseases of the musculoskeletal system and connective tissue: Secondary | ICD-10-CM | POA: Insufficient documentation

## 2020-04-02 DIAGNOSIS — M1990 Unspecified osteoarthritis, unspecified site: Secondary | ICD-10-CM | POA: Insufficient documentation

## 2020-04-02 DIAGNOSIS — F419 Anxiety disorder, unspecified: Secondary | ICD-10-CM | POA: Insufficient documentation

## 2020-04-02 DIAGNOSIS — F32A Depression, unspecified: Secondary | ICD-10-CM | POA: Insufficient documentation

## 2020-04-04 NOTE — Progress Notes (Signed)
Cardiology Office Note:    Date:  04/05/2020   ID:  Brandy Cervantes, DOB December 28, 1945, MRN [id number]  PCP:  Kyla Balzarine, PA-C  Cardiologist:  Norman Herrlich, MD    Referring MD: Kyla Balzarine, PA-C    ASSESSMENT:    1. CAD in native artery   2. Mixed hyperlipidemia   3. Essential hypertension    PLAN:    In order of problems listed above:  1. Stable CAD at this time I continue medical therapy including her dual antiplatelet oral nitrate and lipid-lowering. 2. Stable tolerates statin without muscle pain or weakness check liver function lipid profile 3. BP at target continue current treatment 4. Shortness of breath abnormal CT recheck if worsen will need pulmonary evaluation   Next appointment: 6 months   Medication Adjustments/Labs and Tests Ordered: Current medicines are reviewed at length with the patient today.  Concerns regarding medicines are outlined above.  No orders of the defined types were placed in this encounter.  No orders of the defined types were placed in this encounter.   Chief Complaint  Patient presents with   Follow-up   Coronary Artery Disease    History of Present Illness:    Brandy Cervantes is a 74 y.o. 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 She was seen 01/02/2019 for SOB.  Her d-dimer was elevated and chest CTA showed no evidence of pulmonary embolism.  Lung parenchyma was abnormal with reactive airway disease representing asthma bronchiolitis cryptogenic organizing pneumonia or hypersensitivity pneumonitis.  Her test for COVID-19 was negative.  Echocardiogram showed normal left ventricular size function and no findings of significant valvular heart disease pulmonary hypertension or heart failure She was last seen 10/08/2019 following an emergency room visit for chest pain.  Compliance with diet, lifestyle and medications:  Yes  She is seen in follow-up her daughter is present under intense stress intermittently perhaps once a month as angina nonexertional relieved nitroglycerin.  She has bought over-the-counter oxygen and uses it as needed and finds her self breathless intermittently and with physical activity.  CTA of the chest a year ago showed abnormality representing hypersensitivity or organizing pneumonia.  Is to have a follow-up CT done without contrast high resolution.  Her pattern of atypical angina has not changed.  She also has a chronic cough but no wheezing.  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 Chest CT 06/20/19   CAD in native artery 02/04/2018   Cellulitis of toe, right 09/20/2018   Chest pain 03/03/2018   Chronic back pain    "qwhere" (01/29/2018)   Chronic pain 10/18/2015   Corns of multiple toes 09/20/2018   Decrease in appetite 01/06/2020   Degenerative disc disease, cervical 10/28/2013   Degenerative disc disease, lumbar 10/28/2013   Depression    Esophageal dysphagia 12/31/2017   Essential hypertension 10/18/2015   Exposure to TB    "my mother died when she was 32 of TB"  Fatigue 01/02/2019   Forgetfulness 12/22/2019   GAD (generalized anxiety disorder) 10/18/2015   GERD (gastroesophageal reflux disease) 12/31/2017   History of gout    "I've had it occasionally" (11/23/2016)   Hypertension    Hypotension 07/24/2018   Insomnia 10/18/2015   Low back pain 10/28/2013   Migraine    "a few/year; more in the last few weeks" (01/29/2018)    Mixed hyperlipidemia 10/19/2017   Neck pain 10/28/2013   Osteoarthritis 10/18/2015   Osteoporosis 10/18/2015   Other chest pain 12/31/2017   Other fatigue 01/02/2019   Pain syndrome, chronic 10/28/2013   Raynaud's disease    feet   Shortness of breath 07/24/2018   Tick bite 11/21/2018   Formatting of this note might be different from the original. 2020: right flank   Unstable angina (HCC) 01/27/2018    Past Surgical History:  Procedure Laterality Date   CARDIAC CATHETERIZATION     CORONARY ANGIOPLASTY     CORONARY STENT INTERVENTION N/A 01/29/2018   Procedure: CORONARY STENT INTERVENTION;  Surgeon: Lennette Bihari, MD;  Location: [facility];  Service: Cardiovascular;  Laterality: N/A;   JOINT REPLACEMENT     LEFT HEART CATH AND CORONARY ANGIOGRAPHY N/A 01/29/2018   Procedure: LEFT HEART CATH AND CORONARY ANGIOGRAPHY;  Surgeon: Lennette Bihari, MD;  Location: [facility];  Service: Cardiovascular;  Laterality: N/A;   LEFT HEART CATH AND CORS/GRAFTS ANGIOGRAPHY N/A 03/04/2018   Procedure: LEFT HEART CATH AND CORS/GRAFTS ANGIOGRAPHY;  Surgeon: [name], MD;  Location: [facility];  Service: Cardiovascular;  Laterality: N/A;   MASTOIDECTOMY Right ~ 1955   TOTAL KNEE ARTHROPLASTY Bilateral 11/22/2016   Procedure: TOTAL KNEE BILATERAL;  Surgeon: Gean Birchwood, MD;  Location: [facility] OR;  Service: Orthopedics;  Laterality: Bilateral;    Current Medications: Current Meds  Medication Sig   Ascorbic Acid (VITAMIN C) 1000 MG tablet Take 1,000 mg by mouth daily.   aspirin EC 81 MG tablet Take 1 tablet (81 mg total) by mouth daily.   b complex vitamins capsule Take 1 capsule by mouth daily.   Cholecalciferol (VITAMIN D3) 1000 units CAPS Take 1,000 Units by mouth daily.   clopidogrel (PLAVIX) 75 MG tablet TAKE 1 TABLET BY MOUTH DAILY   HYDROcodone-acetaminophen (NORCO) 10-325 MG tablet Take 1 tablet by mouth every 6 (six) hours as needed for moderate pain.    isosorbide mononitrate (IMDUR) 60 MG 24 hr tablet Take 1 tablet (60 mg total) by mouth daily.   morphine (MS CONTIN) 30 MG 12 hr tablet Take 30 mg by mouth every 12 (twelve) hours.   Multiple Vitamins-Minerals (SENIOR MULTIVITAMIN PLUS) TABS Take 1 tablet by mouth daily.   nitroGLYCERIN (NITROSTAT) 0.4 MG SL tablet Place 1 tablet (0.4 mg total) under the tongue every 5 (five) minutes x 3 doses as needed for chest pain.   OVER THE COUNTER MEDICATION Take 1 tablet by mouth at bedtime as needed (for sleep). Calms Forte   pantoprazole (PROTONIX) 40 MG tablet Take 1 tablet (40 mg total) by mouth daily.   PARoxetine (PAXIL) 20 MG tablet Take 20 mg by mouth daily.   rosuvastatin (CRESTOR) 20 MG tablet TAKE 1 TABLET BY MOUTH 2 TIMES A WEEK   tiZANidine (ZANAFLEX) 2 MG tablet 2 mg every 6 (six) hours as needed.    traZODone (DESYREL) 100 MG tablet Take 100 mg by mouth at bedtime.   TYLENOL PM EXTRA STRENGTH 500-25 MG TABS tablet Take 2 tablets by mouth  at bedtime.     Allergies:   Aspirin, Lisinopril, Topiramate, Alendronate sodium, Celecoxib, Diphenhydramine hcl, Gabapentin, Nsaids, Pravastatin, and Sumatriptan   Social History   Socioeconomic History   Marital status: Married    Spouse name: Not on file   Number of children: Not on file   Years of education: Not on file   Highest education level: Not on file  Occupational History   Not on file  Tobacco Use   Smoking status: Never Smoker   Smokeless tobacco: Never Used  Vaping Use   Vaping Use: Never used  Substance and Sexual Activity   Alcohol use: No   Drug use: No   Sexual activity: Not Currently  Other Topics Concern   Not on file  Social History Narrative   Not on file   Social Determinants of Health   Financial Resource Strain:    Difficulty of Paying Living Expenses: Not on file  Food Insecurity:    Worried About Running Out of Food in the Last Year: Not on file   Ran Out of Food in the Last  Year: Not on file  Transportation Needs:    Lack of Transportation (Medical): Not on file   Lack of Transportation (Non-Medical): Not on file  Physical Activity:    Days of Exercise per Week: Not on file   Minutes of Exercise per Session: Not on file  Stress:    Feeling of Stress : Not on file  Social Connections:    Frequency of Communication with Friends and Family: Not on file   Frequency of Social Gatherings with Friends and Family: Not on file   Attends Religious Services: Not on file   Active Member of Clubs or Organizations: Not on file   Attends Banker Meetings: Not on file   Marital Status: Not on file     Family History: The patient's ] family history includes Heart attack in her father; Heart disease in her brother; Hyperlipidemia in her father; Tuberculosis in her mother. ROS:   Please see the history of present illness.    All other systems reviewed and are negative.  EKGs/Labs/Other Studies Reviewed:    The following studies were reviewed today:    Recent Labs: 08/08/2019: Hemoglobin 14.8; Platelets 297 10/08/2019: ALT 21; BUN 17; Creatinine, Ser 0.72; Potassium 4.4; Sodium 141  Recent Lipid Panel    Component Value Date/Time   CHOL 180 10/08/2019 1433   TRIG 184 (H) 10/08/2019 1433   HDL 57 10/08/2019 1433   CHOLHDL 3.2 10/08/2019 1433   CHOLHDL 4.5 01/28/2018 0326   VLDL 42 (H) 01/28/2018 0326   LDLCALC 92 10/08/2019 1433    Physical Exam:    VS:  BP 131/86    Pulse 72    Ht 5' 3.5" (1.613 m)    Wt 131 lb 12.8 oz (59.8 kg)    SpO2 93%    BMI 22.98 kg/m     Wt Readings from Last 3 Encounters:  04/05/20 131 lb 12.8 oz (59.8 kg)  10/08/19 147 lb 9.6 oz (67 kg)  03/11/19 142 lb 3.2 oz (64.5 kg)     GEN:  Well nourished, well developed in no acute distress HEENT: Normal NECK: No JVD; No carotid bruits LYMPHATICS: No lymphadenopathy CARDIAC: RRR, no murmurs, rubs, gallops RESPIRATORY:  Clear to auscultation without rales,  wheezing or rhonchi  ABDOMEN: Soft, non-tender, non-distended MUSCULOSKELETAL:  No edema; No deformity  SKIN: Warm and dry NEUROLOGIC:  Alert and oriented x 3  PSYCHIATRIC:  Normal affect    Signed, Norman HerrlichBrian Devaris Quirk, MD  04/05/2020 3:07 PM    [facility]

## 2020-04-05 ENCOUNTER — Other Ambulatory Visit: Payer: Self-pay

## 2020-04-05 ENCOUNTER — Ambulatory Visit (INDEPENDENT_AMBULATORY_CARE_PROVIDER_SITE_OTHER): Payer: Medicare Other | Admitting: Cardiology

## 2020-04-05 ENCOUNTER — Encounter: Payer: Self-pay | Admitting: Cardiology

## 2020-04-05 VITALS — BP 131/86 | HR 72 | Ht 63.5 in | Wt 131.8 lb

## 2020-04-05 DIAGNOSIS — I2510 Atherosclerotic heart disease of native coronary artery without angina pectoris: Secondary | ICD-10-CM | POA: Diagnosis not present

## 2020-04-05 DIAGNOSIS — J8410 Pulmonary fibrosis, unspecified: Secondary | ICD-10-CM | POA: Diagnosis not present

## 2020-04-05 DIAGNOSIS — E782 Mixed hyperlipidemia: Secondary | ICD-10-CM

## 2020-04-05 DIAGNOSIS — I10 Essential (primary) hypertension: Secondary | ICD-10-CM

## 2020-04-05 NOTE — Patient Instructions (Addendum)
Medication Instructions:  Your physician recommends that you continue on your current medications as directed. Please refer to the Current Medication list given to you today.  *If you need a refill on your cardiac medications before your next appointment, please call your pharmacy*   Lab Work: TODAY:  CMP & LIPID  If you have labs (blood work) drawn today and your tests are completely normal, you will receive your results only by: Marland Kitchen MyChart Message (if you have MyChart) OR . A paper copy in the mail If you have any lab test that is abnormal or we need to change your treatment, we will call you to review the results.   Testing/Procedures: Your physician recommends you have a High resolution CT Chest without contrast. Non-Cardiac CT scanning, (CAT scanning), is a noninvasive, special x-ray that produces cross-sectional images of the body using x-rays and a computer. CT scans help physicians diagnose and treat medical conditions. For some CT exams, a contrast material is used to enhance visibility in the area of the body being studied. CT scans provide greater clarity and reveal more details than regular x-ray exams.     Follow-Up: At [facility], you and your health needs are our priority.  As part of our continuing mission to provide you with exceptional heart care, we have created designated Provider Care Teams.  These Care Teams include your primary Cardiologist (physician) and Advanced Practice Providers (APPs -  Physician Assistants and Nurse Practitioners) who all work together to provide you with the care you need, when you need it.  We recommend signing up for the patient portal called "MyChart".  Sign up information is provided on this After Visit Summary.  MyChart is used to connect with patients for Virtual Visits (Telemedicine).  Patients are able to view lab/test results, encounter notes, upcoming appointments, etc.  Non-urgent messages can be sent to your provider as well.    To learn more about what you can do with MyChart, go to [email or web address].    Your next appointment:   6 month(s)  The format for your next appointment:   In Person  Provider:   Norman Herrlich, MD   Other Instructions

## 2020-04-06 ENCOUNTER — Telehealth: Payer: Self-pay | Admitting: Cardiology

## 2020-04-06 LAB — COMPREHENSIVE METABOLIC PANEL
ALT: 13 IU/L (ref 0–32)
AST: 27 IU/L (ref 0–40)
Albumin/Globulin Ratio: 2 (ref 1.2–2.2)
Albumin: 4.3 g/dL (ref 3.7–4.7)
Alkaline Phosphatase: 98 IU/L (ref 44–121)
BUN/Creatinine Ratio: 21 (ref 12–28)
BUN: 14 mg/dL (ref 8–27)
Bilirubin Total: 0.2 mg/dL (ref 0.0–1.2)
CO2: 29 mmol/L (ref 20–29)
Calcium: 9.6 mg/dL (ref 8.7–10.3)
Chloride: 98 mmol/L (ref 96–106)
Creatinine, Ser: 0.68 mg/dL (ref 0.57–1.00)
GFR calc Af Amer: 100 mL/min/{1.73_m2} (ref >59)
GFR calc non Af Amer: 86 mL/min/{1.73_m2} (ref >59)
Globulin, Total: 2.2 g/dL (ref 1.5–4.5)
Glucose: 89 mg/dL (ref 65–99)
Potassium: 4.9 mmol/L (ref 3.5–5.2)
Sodium: 139 mmol/L (ref 134–144)
Total Protein: 6.5 g/dL (ref 6.0–8.5)

## 2020-04-06 LAB — LIPID PANEL
Chol/HDL Ratio: 4.4 ratio (ref 0.0–4.4)
Cholesterol, Total: 201 mg/dL — ABNORMAL HIGH (ref 100–199)
HDL: 46 mg/dL (ref >39)
LDL Chol Calc (NIH): 105 mg/dL — ABNORMAL HIGH (ref 0–99)
Triglycerides: 292 mg/dL — ABNORMAL HIGH (ref 0–149)
VLDL Cholesterol Cal: 50 mg/dL — ABNORMAL HIGH (ref 5–40)

## 2020-04-06 NOTE — Telephone Encounter (Signed)
Patient returning call for lab results. 

## 2020-04-06 NOTE — Telephone Encounter (Signed)
Spoke with patient and informed her of results.

## 2020-04-08 ENCOUNTER — Encounter: Payer: Self-pay | Admitting: Cardiology

## 2020-04-14 ENCOUNTER — Telehealth: Payer: Self-pay | Admitting: Cardiology

## 2020-04-14 NOTE — Telephone Encounter (Signed)
Called patient she got the names mixed up. She is referring to the CT she had.

## 2020-04-14 NOTE — Telephone Encounter (Signed)
Note, I looked at my office note and I do not think I ordered an MRI at [facility] on her.

## 2020-04-14 NOTE — Telephone Encounter (Signed)
Pt called to receive her lab work results. Please call

## 2020-04-14 NOTE — Telephone Encounter (Signed)
Spoke with patient regarding results and recommendation.  Patient verbalizes understanding and is agreeable to plan of care. Advised patient to call back with any issues or concerns.  

## 2020-04-14 NOTE — Telephone Encounter (Signed)
Called patient. She is asking fro results of MRI she reports this was done at [facility] hospital and Dr. Dulce Sellar ordered it. Will consult with him. He may have received a paper copr of this?

## 2020-04-15 ENCOUNTER — Telehealth: Payer: Self-pay | Admitting: Cardiology

## 2020-04-15 NOTE — Telephone Encounter (Signed)
Spoke to patient just now and let her know that this appointment should be fine. She verbalizes understanding and thanks me for the call back

## 2020-04-15 NOTE — Telephone Encounter (Signed)
New Message  Pt is calling and says she made a sooner appt with her Pulmonologist for Oct 13th  She is wondering if this is okay or if she needs to try to get in sooner   Please advise

## 2020-04-20 ENCOUNTER — Other Ambulatory Visit: Payer: Self-pay | Admitting: Cardiology

## 2020-06-14 ENCOUNTER — Other Ambulatory Visit: Payer: Self-pay | Admitting: Cardiology

## 2020-06-14 NOTE — Telephone Encounter (Signed)
Rx refill sent to pharmacy. 

## 2020-07-26 IMAGING — DX DG CHEST 2V
2 series · 2 of 2 positions shown · non-contrast
Comparison: January 02, 2019.

CLINICAL DATA: Chest pain, shortness of breath.

EXAM:
CHEST - 2 VIEW

[chest pa]
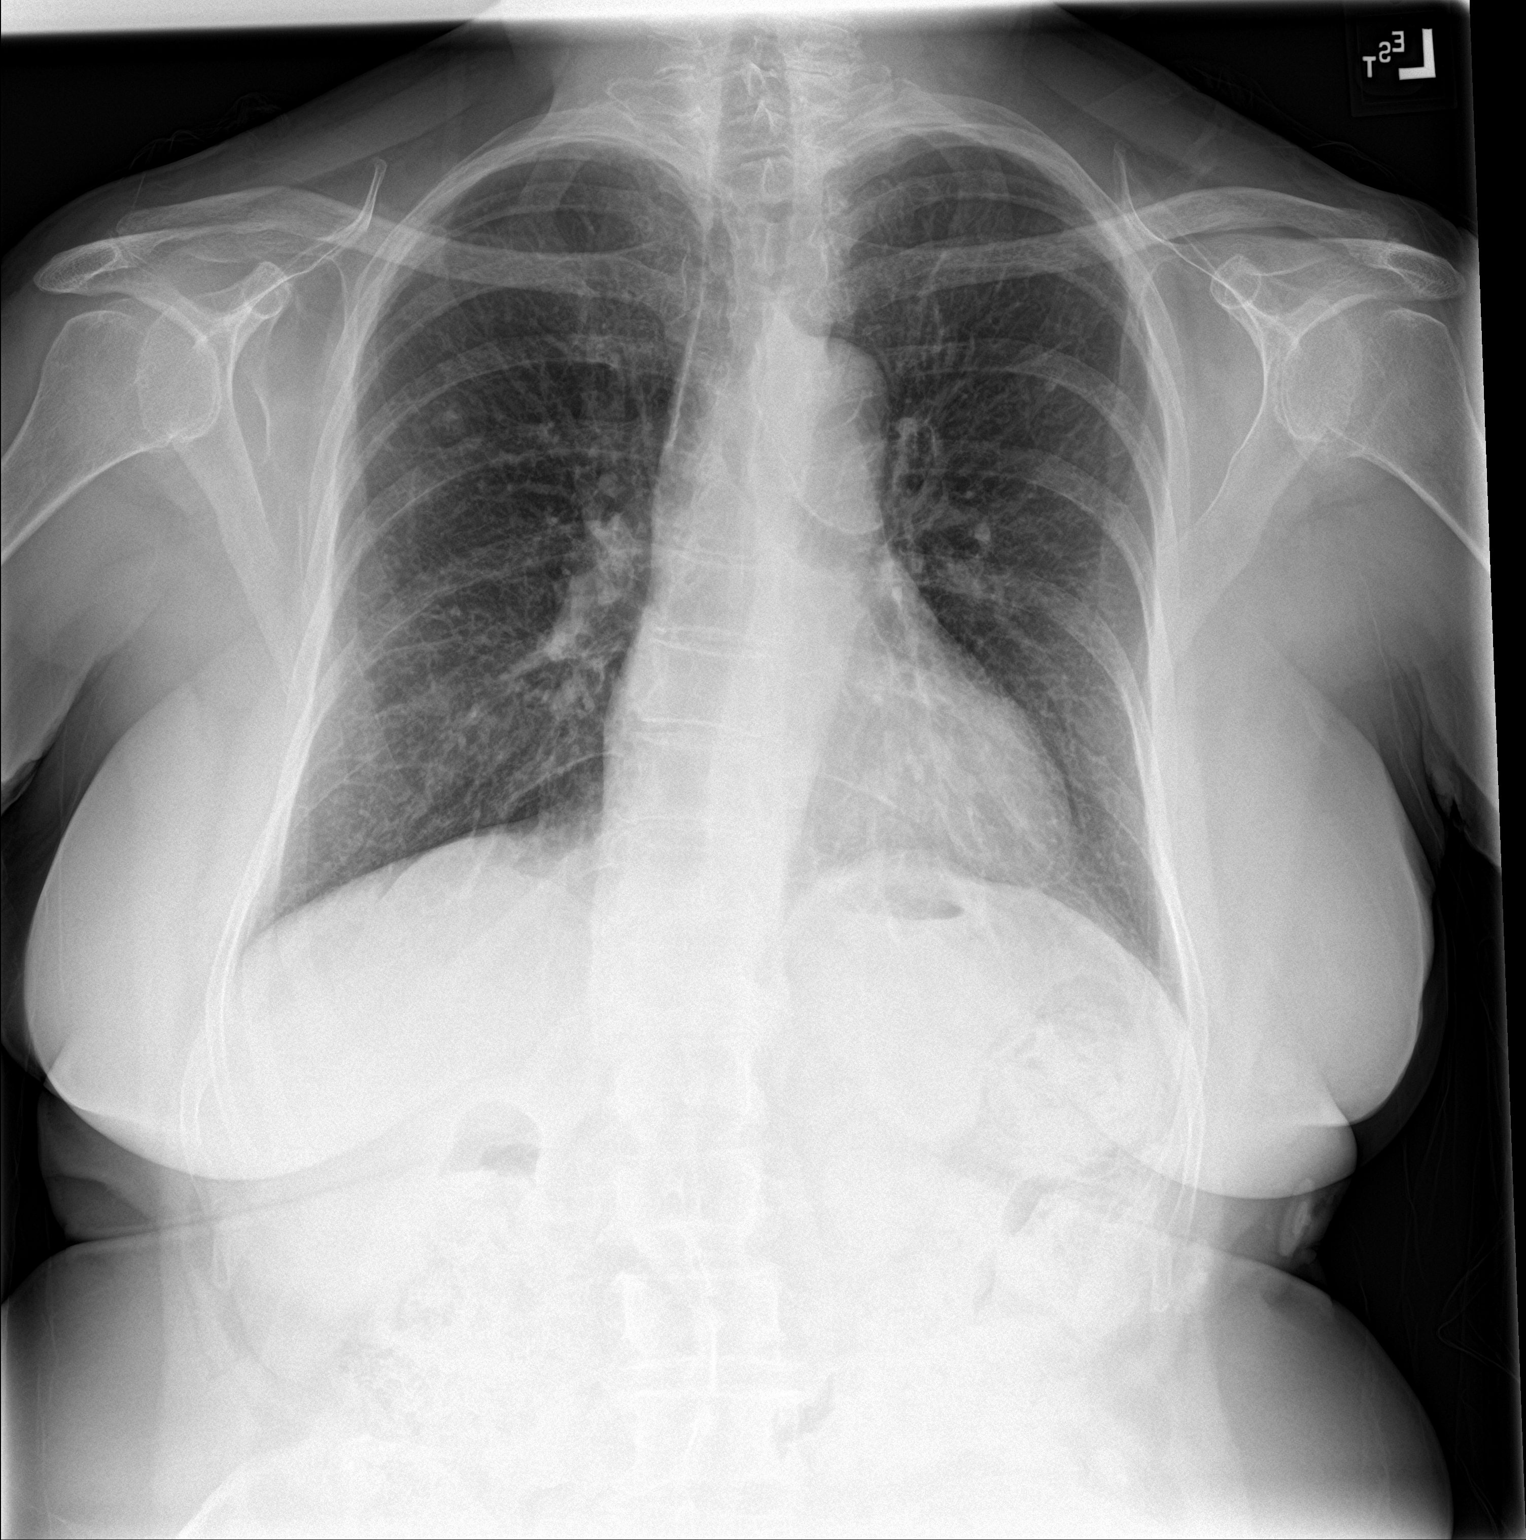

[chest lat]
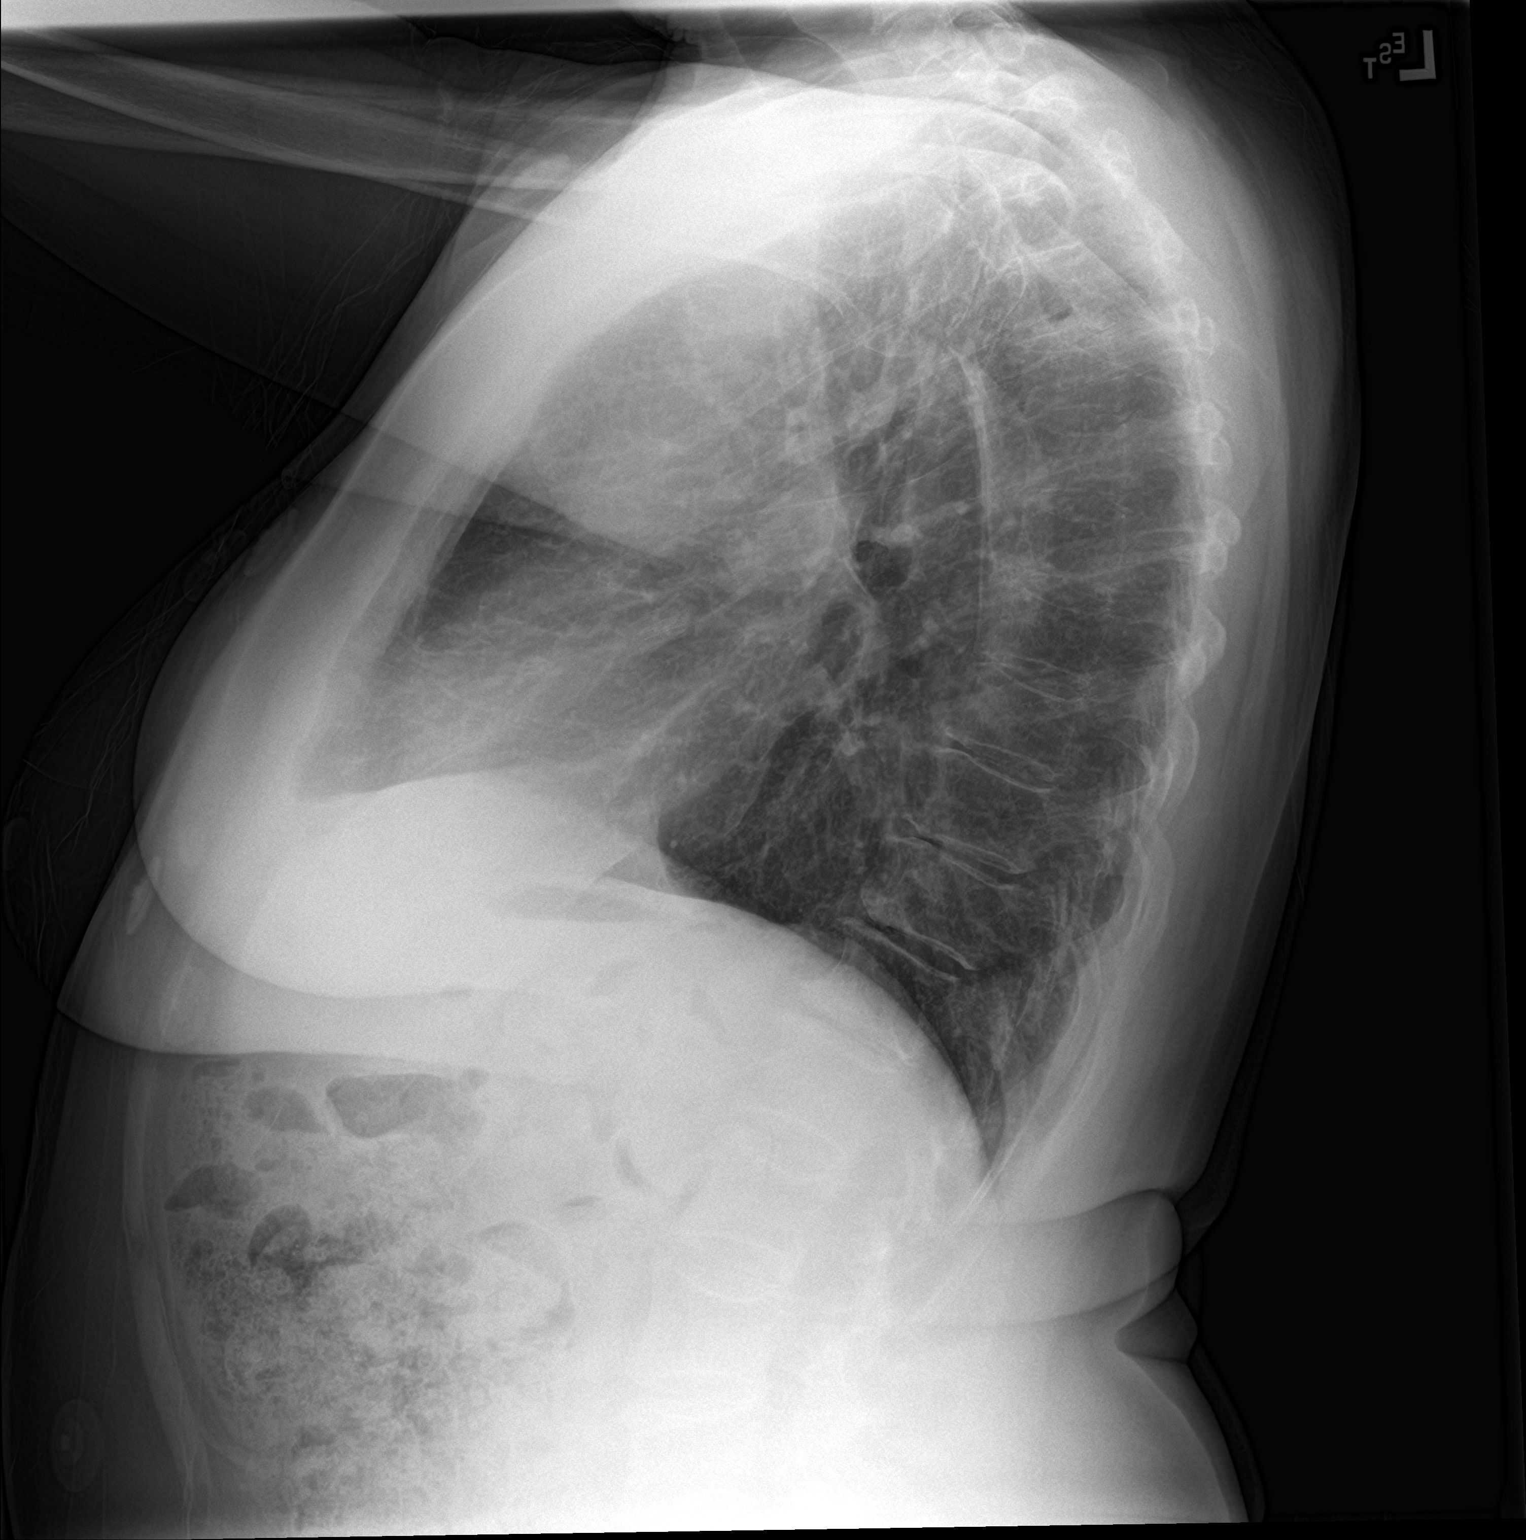

[2 of 2 positions shown; findings below may reference images not displayed]

FINDINGS: The heart size and mediastinal contours are within normal limits.
Both lungs are clear. No pneumothorax or pleural effusion is noted.
The visualized skeletal structures are unremarkable.
IMPRESSION: No active cardiopulmonary disease.

## 2020-08-20 ENCOUNTER — Other Ambulatory Visit: Payer: Self-pay | Admitting: Cardiology

## 2020-09-22 ENCOUNTER — Other Ambulatory Visit: Payer: Self-pay | Admitting: Cardiology

## 2020-09-22 NOTE — Telephone Encounter (Signed)
Rx refill sent to pharmacy. 

## 2020-10-03 NOTE — Progress Notes (Unsigned)
Cardiology Office Note:    Date:  10/04/2020   ID:  Brandy Cervantes, DOB [birth year], MRN [id number]  PCP:  Kyla Balzarine, PA-C  Cardiologist:  Norman Herrlich, MD    Referring MD: Kyla Balzarine, PA-C    ASSESSMENT:    1. CAD in native artery   2. Essential hypertension   3. Mixed hyperlipidemia    PLAN:    In order of problems listed above:  1. She has ongoing symptoms having frequent angina an episode of prolonged chest pain at rest requiring multiple nitroglycerin for relief.  I think she is best served by direct referral to coronary angiography she will continue her aspirin PPI and high intensity statin along with oral nitrates.  If she has flow-limiting stenosis she would benefit from further PCI 2. Stable at target 3. Continue her high intensity statin   Next appointment: 6 weeks   Medication Adjustments/Labs and Tests Ordered: Current medicines are reviewed at length with the patient today.  Concerns regarding medicines are outlined above.  Orders Placed This Encounter  Procedures  . EKG 12-Lead   No orders of the defined types were placed in this encounter.   Chief Complaint  Patient presents with  . Follow-up  . Coronary Artery Disease    History of Present Illness:    Brandy Cervantes is a 75 y.o. 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 She was seen 01/02/2019 for SOB.  Her d-dimer was elevated and chest CTA showed no evidence of pulmonary embolism.  Lung parenchyma was abnormal with reactive airway disease representing asthma bronchiolitis cryptogenic organizing pneumonia or hypersensitivity pneumonitis.  Her test for COVID-19 was negative.  Echocardiogram showed normal left ventricular size function and no findings of significant valvular heart disease pulmonary hypertension or heart failure   She was last seen 04/05/2020.  Compliance with diet,  lifestyle and medications: Yes Her husband is present participates in the evaluation and decision making Saturday when she had the onset of substernal chest pressure tightness very severe did not radiate to the shoulders or jaw no shortness of breath or diaphoresis but took about 20 minutes and 4 nitroglycerin for relief.  She is having angina now about 1 time every week generally relieved with nitroglycerin.  She is very inactive because of diffuse joint pain and cervical spine problems but notices exercise intolerance and profound fatigue.  With ongoing angina and the severe episode on Saturday I think she is best served by referral to coronary angiography.  Options benefits and risks detailed patient and his wife both agree.  She is not having shortness of breath edema or palpitation.  She does have heartburn and indigestion but she tolerates antiplatelet therapy and is on a PPI.  She has no dye allergy. Past Medical History:  Diagnosis Date  . Abnormal EKG 01/24/2018  . Anxiety   . Anxiety and depression 01/08/2020  . Arthritis    "all over" (02/01/2018)  . 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 Bilateral knee pain 10/28/2013  . Bilateral primary osteoarthritis of knee 11/17/2016  . Bronchiectasis without complication (HCC) 07/30/2019   Formatting of this note might be different from the original. Chest CT 06/20/19  . CAD in native artery 02/04/2018  . Cellulitis of toe, right 09/20/2018  . Chest pain 03/03/2018  . Chronic back pain    "qwhere" (01/29/2018)  . Chronic pain 10/18/2015  .  Corns of multiple toes 09/20/2018  . Decrease in appetite 01/06/2020  . Degenerative disc disease, cervical 10/28/2013  . Degenerative disc disease, lumbar 10/28/2013  . Depression   . Esophageal dysphagia 12/31/2017  . Essential hypertension 10/18/2015  . Exposure to TB    "my mother died when she was 39 of TB"  . Fatigue 01/02/2019  . Forgetfulness 12/22/2019  . GAD (generalized anxiety disorder) 10/18/2015  . GERD  (gastroesophageal reflux disease) 12/31/2017  . History of gout    "I've had it occasionally" (11/23/2016)  . Hypertension   . Hypotension 07/24/2018  . Insomnia 10/18/2015  . Low back pain 10/28/2013  . Migraine    "a few/year; more in the last few weeks" (01/29/2018)  . Mixed hyperlipidemia 10/19/2017  . Neck pain 10/28/2013  . Osteoarthritis 10/18/2015  . Osteoporosis 10/18/2015  . Other chest pain 12/31/2017  . Other fatigue 01/02/2019  . Pain syndrome, chronic 10/28/2013  . Raynaud's disease    feet  . Shortness of breath 07/24/2018  . Tick bite 11/21/2018   Formatting of this note might be different from the original. 2020: right flank  . Unstable angina (HCC) 01/27/2018    Past Surgical History:  Procedure Laterality Date  . CARDIAC CATHETERIZATION    . CORONARY ANGIOPLASTY    . CORONARY STENT INTERVENTION N/A 01/29/2018   Procedure: CORONARY STENT INTERVENTION;  Surgeon: Lennette Bihari, MD;  Location: [facility] INVASIVE CV LAB;  Service: Cardiovascular;  Laterality: N/A;  . JOINT REPLACEMENT    . LEFT HEART CATH AND CORONARY ANGIOGRAPHY N/A 01/29/2018   Procedure: LEFT HEART CATH AND CORONARY ANGIOGRAPHY;  Surgeon: Lennette Bihari, MD;  Location: [facility] INVASIVE CV LAB;  Service: Cardiovascular;  Laterality: N/A;  . LEFT HEART CATH AND CORS/GRAFTS ANGIOGRAPHY N/A 03/04/2018   Procedure: LEFT HEART CATH AND CORS/GRAFTS ANGIOGRAPHY;  Surgeon: [name], MD;  Location: [facility] INVASIVE CV LAB;  Service: Cardiovascular;  Laterality: N/A;  . MASTOIDECTOMY Right ~ 1955  . TOTAL KNEE ARTHROPLASTY Bilateral 11/22/2016   Procedure: TOTAL KNEE BILATERAL;  Surgeon: Gean Birchwood, MD;  Location: [facility] OR;  Service: Orthopedics;  Laterality: Bilateral;    Current Medications: Current Meds  Medication Sig  . Ascorbic Acid (VITAMIN C) 1000 MG tablet Take 1,000 mg by mouth daily.  Marland Kitchen aspirin EC 81 MG tablet Take 1 tablet (81 mg total) by mouth daily.  Marland Kitchen b complex vitamins capsule Take 1 capsule by mouth daily.  .  Cholecalciferol (VITAMIN D3) 1000 units CAPS Take 1,000 Units by mouth daily.  . clopidogrel (PLAVIX) 75 MG tablet TAKE 1 TABLET BY MOUTH DAILY  . fluticasone (FLONASE) 50 MCG/ACT nasal spray Place 2 sprays into both nostrils daily as needed for allergies.  Marland Kitchen HYDROcodone-acetaminophen (NORCO) 10-325 MG tablet Take 1 tablet by mouth every 6 (six) hours as needed for moderate pain.  . isosorbide mononitrate (IMDUR) 60 MG 24 hr tablet TAKE 1 TABLET BY MOUTH ONCE DAILY  . morphine (MS CONTIN) 30 MG 12 hr tablet Take 30 mg by mouth every 12 (twelve) hours.  . Multiple Vitamins-Minerals (SENIOR MULTIVITAMIN PLUS) TABS Take 1 tablet by mouth daily.  . nitroGLYCERIN (NITROSTAT) 0.4 MG SL tablet TAKE 1 TABLET UNDER THE TONGUE AS NEEDEDFOR CHEST PAIN ( MAY REPEAT EVERY 5 MINUTES X 3)  . OVER THE COUNTER MEDICATION Take 1 tablet by mouth at bedtime as needed (for sleep). Calms Forte  . pantoprazole (PROTONIX) 40 MG tablet Take 1 tablet (40 mg total) by mouth daily.  Marland Kitchen  PARoxetine (PAXIL) 20 MG tablet Take 20 mg by mouth daily.  . rosuvastatin (CRESTOR) 20 MG tablet TAKE 1 TABLET BY MOUTH 2 TIMES A WEEK  . traZODone (DESYREL) 100 MG tablet Take 100 mg by mouth at bedtime.     Allergies:   Aspirin, Lisinopril, Topiramate, Alendronate sodium, Celecoxib, Diphenhydramine hcl, Gabapentin, Nsaids, Pravastatin, and Sumatriptan   Social History   Socioeconomic History  . Marital status: Married    Spouse name: Not on file  . Number of children: Not on file  . Years of education: Not on file  . Highest education level: Not on file  Occupational History  . Not on file  Tobacco Use  . Smoking status: Never Smoker  . Smokeless tobacco: Never Used  Vaping Use  . Vaping Use: Never used  Substance and Sexual Activity  . Alcohol use: No  . Drug use: No  . Sexual activity: Not Currently  Other Topics Concern  . Not on file  Social History Narrative  . Not on file   Social Determinants of Health    Financial Resource Strain: Not on file  Food Insecurity: Not on file  Transportation Needs: Not on file  Physical Activity: Not on file  Stress: Not on file  Social Connections: Not on file     Family History: The patient's family history includes Heart attack in her father; Heart disease in her brother; Hyperlipidemia in her father; Tuberculosis in her mother. ROS:   Please see the history of present illness.    All other systems reviewed and are negative.  EKGs/Labs/Other Studies Reviewed:    The following studies were reviewed today:  EKG:  EKG ordered today and personally reviewed.  The ekg ordered today demonstrates sinus rhythm and is normal  Recent Labs: 04/05/2020: ALT 13; BUN 14; Creatinine, Ser 0.68; Potassium 4.9; Sodium 139  Recent Lipid Panel    Component Value Date/Time   CHOL 201 (H) 04/05/2020 1529   TRIG 292 (H) 04/05/2020 1529   HDL 46 04/05/2020 1529   CHOLHDL 4.4 04/05/2020 1529   CHOLHDL 4.5 01/28/2018 0326   VLDL 42 (H) 01/28/2018 0326   LDLCALC 105 (H) 04/05/2020 1529    Physical Exam:    VS:  BP 128/82   Pulse 72   Ht 5' 3.6" (1.615 m)   Wt 147 lb (66.7 kg)   SpO2 94%   BMI 25.55 kg/m     Wt Readings from Last 3 Encounters:  10/04/20 147 lb (66.7 kg)  04/05/20 131 lb 12.8 oz (59.8 kg)  10/08/19 147 lb 9.6 oz (67 kg)     GEN:  Well nourished, well developed in no acute distress HEENT: Normal NECK: No JVD; No carotid bruits LYMPHATICS: No lymphadenopathy CARDIAC: RRR, no murmurs, rubs, gallops RESPIRATORY:  Clear to auscultation without rales, wheezing or rhonchi  ABDOMEN: Soft, non-tender, non-distended MUSCULOSKELETAL:  No edema; No deformity  SKIN: Warm and dry NEUROLOGIC:  Alert and oriented x 3 PSYCHIATRIC:  Normal affect    Signed, Norman Herrlich, MD  10/04/2020 9:49 AM    [facility]

## 2020-10-03 NOTE — H&P (View-Only) (Signed)
Cardiology Office Note:    Date:  10/04/2020   ID:  Brandy Cervantes, DOB [birth year], MRN [id number]  PCP:  Kyla Balzarine, PA-C  Cardiologist:  Norman Herrlich, MD    Referring MD: Kyla Balzarine, PA-C    ASSESSMENT:    1. CAD in native artery   2. Essential hypertension   3. Mixed hyperlipidemia    PLAN:    In order of problems listed above:  1. She has ongoing symptoms having frequent angina an episode of prolonged chest pain at rest requiring multiple nitroglycerin for relief.  I think she is best served by direct referral to coronary angiography she will continue her aspirin PPI and high intensity statin along with oral nitrates.  If she has flow-limiting stenosis she would benefit from further PCI 2. Stable at target 3. Continue her high intensity statin   Next appointment: 6 weeks   Medication Adjustments/Labs and Tests Ordered: Current medicines are reviewed at length with the patient today.  Concerns regarding medicines are outlined above.  Orders Placed This Encounter  Procedures  . EKG 12-Lead   No orders of the defined types were placed in this encounter.   Chief Complaint  Patient presents with  . Follow-up  . Coronary Artery Disease    History of Present Illness:    Brandy Cervantes is a 75 y.o. 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 She was seen 01/02/2019 for SOB.  Her d-dimer was elevated and chest CTA showed no evidence of pulmonary embolism.  Lung parenchyma was abnormal with reactive airway disease representing asthma bronchiolitis cryptogenic organizing pneumonia or hypersensitivity pneumonitis.  Her test for COVID-19 was negative.  Echocardiogram showed normal left ventricular size function and no findings of significant valvular heart disease pulmonary hypertension or heart failure   She was last seen 04/05/2020.  Compliance with diet,  lifestyle and medications: Yes Her husband is present participates in the evaluation and decision making Saturday when she had the onset of substernal chest pressure tightness very severe did not radiate to the shoulders or jaw no shortness of breath or diaphoresis but took about 20 minutes and 4 nitroglycerin for relief.  She is having angina now about 1 time every week generally relieved with nitroglycerin.  She is very inactive because of diffuse joint pain and cervical spine problems but notices exercise intolerance and profound fatigue.  With ongoing angina and the severe episode on Saturday I think she is best served by referral to coronary angiography.  Options benefits and risks detailed patient and his wife both agree.  She is not having shortness of breath edema or palpitation.  She does have heartburn and indigestion but she tolerates antiplatelet therapy and is on a PPI.  She has no dye allergy. Past Medical History:  Diagnosis Date  . Abnormal EKG 01/24/2018  . Anxiety   . Anxiety and depression 01/08/2020  . Arthritis    "all over" (02/01/2018)  . 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 Bilateral knee pain 10/28/2013  . Bilateral primary osteoarthritis of knee 11/17/2016  . Bronchiectasis without complication (HCC) 07/30/2019   Formatting of this note might be different from the original. Chest CT 06/20/19  . CAD in native artery 02/04/2018  . Cellulitis of toe, right 09/20/2018  . Chest pain 03/03/2018  . Chronic back pain    "qwhere" (01/29/2018)  . Chronic pain 10/18/2015  .  Corns of multiple toes 09/20/2018  . Decrease in appetite 01/06/2020  . Degenerative disc disease, cervical 10/28/2013  . Degenerative disc disease, lumbar 10/28/2013  . Depression   . Esophageal dysphagia 12/31/2017  . Essential hypertension 10/18/2015  . Exposure to TB    "my mother died when she was 39 of TB"  . Fatigue 01/02/2019  . Forgetfulness 12/22/2019  . GAD (generalized anxiety disorder) 10/18/2015  . GERD  (gastroesophageal reflux disease) 12/31/2017  . History of gout    "I've had it occasionally" (11/23/2016)  . Hypertension   . Hypotension 07/24/2018  . Insomnia 10/18/2015  . Low back pain 10/28/2013  . Migraine    "a few/year; more in the last few weeks" (01/29/2018)  . Mixed hyperlipidemia 10/19/2017  . Neck pain 10/28/2013  . Osteoarthritis 10/18/2015  . Osteoporosis 10/18/2015  . Other chest pain 12/31/2017  . Other fatigue 01/02/2019  . Pain syndrome, chronic 10/28/2013  . Raynaud's disease    feet  . Shortness of breath 07/24/2018  . Tick bite 11/21/2018   Formatting of this note might be different from the original. 2020: right flank  . Unstable angina (HCC) 01/27/2018    Past Surgical History:  Procedure Laterality Date  . CARDIAC CATHETERIZATION    . CORONARY ANGIOPLASTY    . CORONARY STENT INTERVENTION N/A 01/29/2018   Procedure: CORONARY STENT INTERVENTION;  Surgeon: Lennette Bihari, MD;  Location: [facility] INVASIVE CV LAB;  Service: Cardiovascular;  Laterality: N/A;  . JOINT REPLACEMENT    . LEFT HEART CATH AND CORONARY ANGIOGRAPHY N/A 01/29/2018   Procedure: LEFT HEART CATH AND CORONARY ANGIOGRAPHY;  Surgeon: Lennette Bihari, MD;  Location: [facility] INVASIVE CV LAB;  Service: Cardiovascular;  Laterality: N/A;  . LEFT HEART CATH AND CORS/GRAFTS ANGIOGRAPHY N/A 03/04/2018   Procedure: LEFT HEART CATH AND CORS/GRAFTS ANGIOGRAPHY;  Surgeon: [name], MD;  Location: [facility] INVASIVE CV LAB;  Service: Cardiovascular;  Laterality: N/A;  . MASTOIDECTOMY Right ~ 1955  . TOTAL KNEE ARTHROPLASTY Bilateral 11/22/2016   Procedure: TOTAL KNEE BILATERAL;  Surgeon: Gean Birchwood, MD;  Location: [facility] OR;  Service: Orthopedics;  Laterality: Bilateral;    Current Medications: Current Meds  Medication Sig  . Ascorbic Acid (VITAMIN C) 1000 MG tablet Take 1,000 mg by mouth daily.  Marland Kitchen aspirin EC 81 MG tablet Take 1 tablet (81 mg total) by mouth daily.  Marland Kitchen b complex vitamins capsule Take 1 capsule by mouth daily.  .  Cholecalciferol (VITAMIN D3) 1000 units CAPS Take 1,000 Units by mouth daily.  . clopidogrel (PLAVIX) 75 MG tablet TAKE 1 TABLET BY MOUTH DAILY  . fluticasone (FLONASE) 50 MCG/ACT nasal spray Place 2 sprays into both nostrils daily as needed for allergies.  Marland Kitchen HYDROcodone-acetaminophen (NORCO) 10-325 MG tablet Take 1 tablet by mouth every 6 (six) hours as needed for moderate pain.  . isosorbide mononitrate (IMDUR) 60 MG 24 hr tablet TAKE 1 TABLET BY MOUTH ONCE DAILY  . morphine (MS CONTIN) 30 MG 12 hr tablet Take 30 mg by mouth every 12 (twelve) hours.  . Multiple Vitamins-Minerals (SENIOR MULTIVITAMIN PLUS) TABS Take 1 tablet by mouth daily.  . nitroGLYCERIN (NITROSTAT) 0.4 MG SL tablet TAKE 1 TABLET UNDER THE TONGUE AS NEEDEDFOR CHEST PAIN ( MAY REPEAT EVERY 5 MINUTES X 3)  . OVER THE COUNTER MEDICATION Take 1 tablet by mouth at bedtime as needed (for sleep). Calms Forte  . pantoprazole (PROTONIX) 40 MG tablet Take 1 tablet (40 mg total) by mouth daily.  Marland Kitchen  PARoxetine (PAXIL) 20 MG tablet Take 20 mg by mouth daily.  . rosuvastatin (CRESTOR) 20 MG tablet TAKE 1 TABLET BY MOUTH 2 TIMES A WEEK  . traZODone (DESYREL) 100 MG tablet Take 100 mg by mouth at bedtime.     Allergies:   Aspirin, Lisinopril, Topiramate, Alendronate sodium, Celecoxib, Diphenhydramine hcl, Gabapentin, Nsaids, Pravastatin, and Sumatriptan   Social History   Socioeconomic History  . Marital status: Married    Spouse name: Not on file  . Number of children: Not on file  . Years of education: Not on file  . Highest education level: Not on file  Occupational History  . Not on file  Tobacco Use  . Smoking status: Never Smoker  . Smokeless tobacco: Never Used  Vaping Use  . Vaping Use: Never used  Substance and Sexual Activity  . Alcohol use: No  . Drug use: No  . Sexual activity: Not Currently  Other Topics Concern  . Not on file  Social History Narrative  . Not on file   Social Determinants of Health    Financial Resource Strain: Not on file  Food Insecurity: Not on file  Transportation Needs: Not on file  Physical Activity: Not on file  Stress: Not on file  Social Connections: Not on file     Family History: The patient's family history includes Heart attack in her father; Heart disease in her brother; Hyperlipidemia in her father; Tuberculosis in her mother. ROS:   Please see the history of present illness.    All other systems reviewed and are negative.  EKGs/Labs/Other Studies Reviewed:    The following studies were reviewed today:  EKG:  EKG ordered today and personally reviewed.  The ekg ordered today demonstrates sinus rhythm and is normal  Recent Labs: 04/05/2020: ALT 13; BUN 14; Creatinine, Ser 0.68; Potassium 4.9; Sodium 139  Recent Lipid Panel    Component Value Date/Time   CHOL 201 (H) 04/05/2020 1529   TRIG 292 (H) 04/05/2020 1529   HDL 46 04/05/2020 1529   CHOLHDL 4.4 04/05/2020 1529   CHOLHDL 4.5 01/28/2018 0326   VLDL 42 (H) 01/28/2018 0326   LDLCALC 105 (H) 04/05/2020 1529    Physical Exam:    VS:  BP 128/82   Pulse 72   Ht 5' 3.6" (1.615 m)   Wt 147 lb (66.7 kg)   SpO2 94%   BMI 25.55 kg/m     Wt Readings from Last 3 Encounters:  10/04/20 147 lb (66.7 kg)  04/05/20 131 lb 12.8 oz (59.8 kg)  10/08/19 147 lb 9.6 oz (67 kg)     GEN:  Well nourished, well developed in no acute distress HEENT: Normal NECK: No JVD; No carotid bruits LYMPHATICS: No lymphadenopathy CARDIAC: RRR, no murmurs, rubs, gallops RESPIRATORY:  Clear to auscultation without rales, wheezing or rhonchi  ABDOMEN: Soft, non-tender, non-distended MUSCULOSKELETAL:  No edema; No deformity  SKIN: Warm and dry NEUROLOGIC:  Alert and oriented x 3 PSYCHIATRIC:  Normal affect    Signed, Rafeef Lau, MD  10/04/2020 9:49 AM    [facility]  

## 2020-10-04 ENCOUNTER — Other Ambulatory Visit: Payer: Self-pay

## 2020-10-04 ENCOUNTER — Ambulatory Visit: Payer: Medicare Other | Admitting: Cardiology

## 2020-10-04 ENCOUNTER — Encounter: Payer: Self-pay | Admitting: Cardiology

## 2020-10-04 VITALS — BP 128/82 | HR 72 | Ht 63.6 in | Wt 147.0 lb

## 2020-10-04 DIAGNOSIS — I10 Essential (primary) hypertension: Secondary | ICD-10-CM

## 2020-10-04 DIAGNOSIS — I2510 Atherosclerotic heart disease of native coronary artery without angina pectoris: Secondary | ICD-10-CM | POA: Diagnosis not present

## 2020-10-04 DIAGNOSIS — E782 Mixed hyperlipidemia: Secondary | ICD-10-CM | POA: Diagnosis not present

## 2020-10-04 MED ORDER — NITROGLYCERIN 0.4 MG SL SUBL: SUBLINGUAL_TABLET | SUBLINGUAL | 0 refills | Status: DC

## 2020-10-04 NOTE — Patient Instructions (Signed)
Medication Instructions:  Your physician recommends that you continue on your current medications as directed. Please refer to the Current Medication list given to you today.  *If you need a refill on your cardiac medications before your next appointment, please call your pharmacy*   Lab Work: Your physician recommends that you return for lab work in: TODAY CBC, BMP, Lipids If you have labs (blood work) drawn today and your tests are completely normal, you will receive your results only by: Marland Kitchen MyChart Message (if you have MyChart) OR . A paper copy in the mail If you have any lab test that is abnormal or we need to change your treatment, we will call you to review the results.   Testing/Procedures:  [facility] CARDIOVASCULAR DIVISION [facility] [place] [address] [place] [address] Dept: [phone] Loc: [phone]  Brandy Cervantes  10/04/2020  You are scheduled for a Cardiac Catheterization on Friday, March 25 with Dr. Bryan Lemma.  1. Please arrive at the [facility] (Main Entrance A) at [facility]: [address] [place], [place] [address] at 8:30 AM (This time is two hours before your procedure to ensure your preparation). Free valet parking service is available.   Special note: Every effort is made to have your procedure done on time. Please understand that emergencies sometimes delay scheduled procedures.  2. Diet: Do not eat solid foods after midnight.  The patient may have clear liquids until 5am upon the day of the procedure.  3. Labs:  YOU HAD YOUR LABS DRAWN TODAY 10/04/2020  4. Medication instructions in preparation for your procedure:   Contrast Allergy: No  On the morning of your procedure, take your Aspirin and any morning medicines NOT listed above.  You may use sips of water.  5. Plan for one night stay--bring personal belongings. 6. Bring a current list of your medications and current insurance  cards. 7. You MUST have a responsible person to drive you home. 8. Someone MUST be with you the first 24 hours after you arrive home or your discharge will be delayed. 9. Please wear clothes that are easy to get on and off and wear slip-on shoes.  Thank you for allowing [place] to care for you!   --  Invasive Cardiovascular services    Follow-Up: At [facility], you and your health needs are our priority.  As part of our continuing mission to provide you with exceptional heart care, we have created designated Provider Care Teams.  These Care Teams include your primary Cardiologist (physician) and Advanced Practice Providers (APPs -  Physician Assistants and Nurse Practitioners) who all work together to provide you with the care you need, when you need it.  We recommend signing up for the patient portal called "MyChart".  Sign up information is provided on this After Visit Summary.  MyChart is used to connect with patients for Virtual Visits (Telemedicine).  Patients are able to view lab/test results, encounter notes, upcoming appointments, etc.  Non-urgent messages can be sent to your provider as well.   To learn more about what you can do with MyChart, go to [email or web address].    Your next appointment:   6 week(s)  The format for your next appointment:   In Person  Provider:   Norman Herrlich, MD   Other Instructions

## 2020-10-05 ENCOUNTER — Telehealth: Payer: Self-pay

## 2020-10-05 LAB — BASIC METABOLIC PANEL
BUN/Creatinine Ratio: 19 (ref 12–28)
BUN: 14 mg/dL (ref 8–27)
CO2: 29 mmol/L (ref 20–29)
Calcium: 9.2 mg/dL (ref 8.7–10.3)
Chloride: 99 mmol/L (ref 96–106)
Creatinine, Ser: 0.75 mg/dL (ref 0.57–1.00)
Glucose: 85 mg/dL (ref 65–99)
Potassium: 4.4 mmol/L (ref 3.5–5.2)
Sodium: 140 mmol/L (ref 134–144)
eGFR: 83 mL/min/{1.73_m2} (ref >59)

## 2020-10-05 LAB — LIPID PANEL
Chol/HDL Ratio: 4.9 ratio — ABNORMAL HIGH (ref 0.0–4.4)
Cholesterol, Total: 197 mg/dL (ref 100–199)
HDL: 40 mg/dL (ref >39)
LDL Chol Calc (NIH): 95 mg/dL (ref 0–99)
Triglycerides: 372 mg/dL — ABNORMAL HIGH (ref 0–149)
VLDL Cholesterol Cal: 62 mg/dL — ABNORMAL HIGH (ref 5–40)

## 2020-10-05 LAB — CBC
Hematocrit: 38.9 % (ref 34.0–46.6)
Hemoglobin: 13 g/dL (ref 11.1–15.9)
MCH: 29.8 pg (ref 26.6–33.0)
MCHC: 33.4 g/dL (ref 31.5–35.7)
MCV: 89 fL (ref 79–97)
Platelets: 282 10*3/uL (ref 150–450)
RBC: 4.36 x10E6/uL (ref 3.77–5.28)
RDW: 12 % (ref 11.7–15.4)
WBC: 7.6 10*3/uL (ref 3.4–10.8)

## 2020-10-05 NOTE — Telephone Encounter (Signed)
Spoke with patient regarding results and recommendation.  Patient verbalizes understanding and is agreeable to plan of care. Advised patient to call back with any issues or concerns.  

## 2020-10-05 NOTE — Telephone Encounter (Signed)
-----   Message from Baldo Daub, MD sent at 10/05/2020  7:56 AM EDT ----- Stable results for coronary angiography

## 2020-10-06 ENCOUNTER — Other Ambulatory Visit (HOSPITAL_COMMUNITY)
Admission: RE | Admit: 2020-10-06 | Discharge: 2020-10-06 | Disposition: A | Discharge status: Home / Self Care | Payer: Medicare Other | Source: Ambulatory Visit | Attending: Cardiology | Admitting: Cardiology

## 2020-10-06 ENCOUNTER — Other Ambulatory Visit: Payer: Self-pay | Admitting: Cardiology

## 2020-10-06 DIAGNOSIS — Z01812 Encounter for preprocedural laboratory examination: Secondary | ICD-10-CM | POA: Diagnosis present

## 2020-10-06 DIAGNOSIS — Z20822 Contact with and (suspected) exposure to covid-19: Secondary | ICD-10-CM | POA: Diagnosis not present

## 2020-10-06 LAB — SARS CORONAVIRUS 2 (TAT 6-24 HRS): SARS Coronavirus 2: NEGATIVE

## 2020-10-07 ENCOUNTER — Telehealth: Payer: Self-pay | Admitting: *Deleted

## 2020-10-07 NOTE — Telephone Encounter (Signed)
Pt contacted pre-catheterization scheduled at [facility] for: Friday March 25,2020 10:30 AM Verified arrival time and place: [facility] Main Entrance A [facility]) at: 8:30 AM   No solid food after midnight prior to cath, clear liquids until 5 AM day of procedure.   AM meds can be  taken pre-cath with sips of water including: ASA 81 mg Plavix 75 mg  Confirmed patient has responsible adult to drive home post procedure and be with patient first 24 hours after arriving home: yes  You are allowed ONE visitor in the waiting room during the time you are at the hospital for your procedure. Both you and your visitor must wear a mask once you enter the hospital.   Reviewed procedure /mask/visitor instructions with patient.

## 2020-10-08 ENCOUNTER — Encounter (HOSPITAL_COMMUNITY): Payer: Self-pay | Admitting: Cardiology

## 2020-10-08 ENCOUNTER — Ambulatory Visit (HOSPITAL_COMMUNITY)
Admission: RE | Disposition: A | Discharge status: Home / Self Care | Payer: Self-pay | Source: Home / Self Care | Attending: Cardiology

## 2020-10-08 ENCOUNTER — Other Ambulatory Visit: Payer: Self-pay

## 2020-10-08 ENCOUNTER — Ambulatory Visit (HOSPITAL_COMMUNITY)
Admission: RE | Admit: 2020-10-08 | Discharge: 2020-10-08 | Disposition: A | Discharge status: Home / Self Care | Payer: Medicare Other | Attending: Cardiology | Admitting: Cardiology

## 2020-10-08 DIAGNOSIS — Z886 Allergy status to analgesic agent status: Secondary | ICD-10-CM | POA: Insufficient documentation

## 2020-10-08 DIAGNOSIS — I25119 Atherosclerotic heart disease of native coronary artery with unspecified angina pectoris: Secondary | ICD-10-CM | POA: Diagnosis present

## 2020-10-08 DIAGNOSIS — I200 Unstable angina: Secondary | ICD-10-CM

## 2020-10-08 DIAGNOSIS — E782 Mixed hyperlipidemia: Secondary | ICD-10-CM | POA: Insufficient documentation

## 2020-10-08 DIAGNOSIS — Z955 Presence of coronary angioplasty implant and graft: Secondary | ICD-10-CM | POA: Diagnosis not present

## 2020-10-08 DIAGNOSIS — I10 Essential (primary) hypertension: Secondary | ICD-10-CM | POA: Diagnosis not present

## 2020-10-08 DIAGNOSIS — Z7902 Long term (current) use of antithrombotics/antiplatelets: Secondary | ICD-10-CM | POA: Insufficient documentation

## 2020-10-08 DIAGNOSIS — I2510 Atherosclerotic heart disease of native coronary artery without angina pectoris: Secondary | ICD-10-CM

## 2020-10-08 DIAGNOSIS — I25110 Atherosclerotic heart disease of native coronary artery with unstable angina pectoris: Secondary | ICD-10-CM | POA: Insufficient documentation

## 2020-10-08 DIAGNOSIS — Z888 Allergy status to other drugs, medicaments and biological substances status: Secondary | ICD-10-CM | POA: Insufficient documentation

## 2020-10-08 DIAGNOSIS — Z7982 Long term (current) use of aspirin: Secondary | ICD-10-CM | POA: Insufficient documentation

## 2020-10-08 DIAGNOSIS — Z79899 Other long term (current) drug therapy: Secondary | ICD-10-CM | POA: Diagnosis not present

## 2020-10-08 HISTORY — PX: LEFT HEART CATH AND CORONARY ANGIOGRAPHY: CATH118249

## 2020-10-08 SURGERY — LEFT HEART CATH AND CORONARY ANGIOGRAPHY
Anesthesia: LOCAL

## 2020-10-08 MED ORDER — HEPARIN (PORCINE) IN NACL 1000-0.9 UT/500ML-% IV SOLN
INTRAVENOUS | Status: DC | PRN
  Administered 2020-10-08 (×2): 500 mL

## 2020-10-08 MED ORDER — SODIUM CHLORIDE 0.9 % WEIGHT BASED INFUSION
3.0000 mL/kg/h | INTRAVENOUS | Status: AC
  Administered 2020-10-08: 3 mL/kg/h via INTRAVENOUS

## 2020-10-08 MED ORDER — SODIUM CHLORIDE 0.9% FLUSH: 3.0000 mL | Freq: Two times a day (BID) | INTRAVENOUS | Status: DC

## 2020-10-08 MED ORDER — SODIUM CHLORIDE 0.9 % WEIGHT BASED INFUSION: 1.0000 mL/kg/h | INTRAVENOUS | Status: DC

## 2020-10-08 MED ORDER — HEPARIN SODIUM (PORCINE) 1000 UNIT/ML IJ SOLN
INTRAMUSCULAR | Status: AC
  Filled 2020-10-08: qty 1

## 2020-10-08 MED ORDER — LIDOCAINE HCL (PF) 1 % IJ SOLN
INTRAMUSCULAR | Status: DC | PRN
  Administered 2020-10-08: 2 mL

## 2020-10-08 MED ORDER — FENTANYL CITRATE (PF) 100 MCG/2ML IJ SOLN
INTRAMUSCULAR | Status: DC | PRN
  Administered 2020-10-08: 25 ug via INTRAVENOUS

## 2020-10-08 MED ORDER — VERAPAMIL HCL 2.5 MG/ML IV SOLN
INTRAVENOUS | Status: AC
  Filled 2020-10-08: qty 2

## 2020-10-08 MED ORDER — MIDAZOLAM HCL 2 MG/2ML IJ SOLN
INTRAMUSCULAR | Status: AC
  Filled 2020-10-08: qty 2

## 2020-10-08 MED ORDER — HEPARIN (PORCINE) IN NACL 1000-0.9 UT/500ML-% IV SOLN
INTRAVENOUS | Status: AC
  Filled 2020-10-08: qty 1500

## 2020-10-08 MED ORDER — MIDAZOLAM HCL 2 MG/2ML IJ SOLN
INTRAMUSCULAR | Status: DC | PRN
  Administered 2020-10-08: 1 mg via INTRAVENOUS

## 2020-10-08 MED ORDER — SODIUM CHLORIDE 0.9% FLUSH: 3.0000 mL | INTRAVENOUS | Status: DC | PRN

## 2020-10-08 MED ORDER — HEPARIN SODIUM (PORCINE) 1000 UNIT/ML IJ SOLN
INTRAMUSCULAR | Status: DC | PRN
  Administered 2020-10-08: 3500 [IU] via INTRAVENOUS

## 2020-10-08 MED ORDER — IOHEXOL 350 MG/ML SOLN
INTRAVENOUS | Status: DC | PRN
  Administered 2020-10-08: 40 mL

## 2020-10-08 MED ORDER — VERAPAMIL HCL 2.5 MG/ML IV SOLN
INTRAVENOUS | Status: DC | PRN
  Administered 2020-10-08: 10 mL via INTRA_ARTERIAL

## 2020-10-08 MED ORDER — SODIUM CHLORIDE 0.9 % IV SOLN: 250.0000 mL | INTRAVENOUS | Status: DC | PRN

## 2020-10-08 MED ORDER — FENTANYL CITRATE (PF) 100 MCG/2ML IJ SOLN
INTRAMUSCULAR | Status: AC
  Filled 2020-10-08: qty 2

## 2020-10-08 MED ORDER — LIDOCAINE HCL (PF) 1 % IJ SOLN
INTRAMUSCULAR | Status: AC
  Filled 2020-10-08: qty 30

## 2020-10-08 MED ORDER — ASPIRIN 81 MG PO CHEW: 81.0000 mg | CHEWABLE_TABLET | ORAL | Status: DC

## 2020-10-08 SURGICAL SUPPLY — 10 items
CATH OPTITORQUE TIG 4.0 5F (CATHETERS) ×1 IMPLANT
DEVICE RAD TR BAND REGULAR (VASCULAR PRODUCTS) ×1 IMPLANT
GLIDESHEATH SLEND SS 6F .021 (SHEATH) ×1 IMPLANT
GUIDEWIRE INQWIRE 1.5J.035X260 (WIRE) IMPLANT
INQWIRE 1.5J .035X260CM (WIRE) ×2
KIT HEART LEFT (KITS) ×2 IMPLANT
PACK CARDIAC CATHETERIZATION (CUSTOM PROCEDURE TRAY) ×2 IMPLANT
SHEATH PROBE COVER 6X72 (BAG) ×1 IMPLANT
TRANSDUCER W/STOPCOCK (MISCELLANEOUS) ×2 IMPLANT
TUBING CIL FLEX 10 FLL-RA (TUBING) ×2 IMPLANT

## 2020-10-08 NOTE — Interval H&P Note (Signed)
History and Physical Interval Note:  10/08/2020 10:04 AM  Brandy Cervantes  has presented today for surgery, with the diagnosis of CAD WITH PROGRESSIVE ANGINA  The various methods of treatment have been discussed with the patient and family. After consideration of risks, benefits and other options for treatment, the patient has consented to  Procedure(s): LEFT HEART CATH AND CORONARY ANGIOGRAPHY (N/A)  PERCUTANEOUS CORONARY INTERVENTION  as a surgical intervention.  The patient's history has been reviewed, patient examined, no change in status, stable for surgery.  I have reviewed the patient's chart and labs.  Questions were answered to the patient's satisfaction.    Cath Lab Visit (complete for each Cath Lab visit)  Clinical Evaluation Leading to the Procedure:   ACS: Yes.    Non-ACS:    Anginal Classification: CCS III  Anti-ischemic medical therapy: Minimal Therapy (1 class of medications)  Non-Invasive Test Results: No non-invasive testing performed  Prior CABG: No previous CABG    Bryan Lemma

## 2020-10-08 NOTE — Progress Notes (Signed)
Discharge instructions reviewed with pt and her husband both voice understanding.  

## 2020-10-08 NOTE — Discharge Instructions (Signed)
Radial Site Care  This sheet gives you information about how to care for yourself after your procedure. Your health care provider may also give you more specific instructions. If you have problems or questions, contact your health care provider. What can I expect after the procedure? After the procedure, it is common to have:  Bruising and tenderness at the catheter insertion area. Follow these instructions at home: Medicines  Take over-the-counter and prescription medicines only as told by your health care provider. Insertion site care 1. Follow instructions from your health care provider about how to take care of your insertion site. Make sure you: ? Wash your hands with soap and water before you remove your bandage (dressing). If soap and water are not available, use hand sanitizer. ? May remove dressing in 24 hours. 2. Check your insertion site every day for signs of infection. Check for: ? Redness, swelling, or pain. ? Fluid or blood. ? Pus or a bad smell. ? Warmth. 3. Do no take baths, swim, or use a hot tub for 5 days. 4. You may shower 24-48 hours after the procedure. ? Remove the dressing and gently wash the site with plain soap and water. ? Pat the area dry with a clean towel. ? Do not rub the site. That could cause bleeding. 5. Do not apply powder or lotion to the site. Activity  1. For 24 hours after the procedure, or as directed by your health care provider: ? Do not flex or bend the affected arm. ? Do not push or pull heavy objects with the affected arm. ? Do not drive yourself home from the hospital or clinic. You may drive 24 hours after the procedure. ? Do not operate machinery or power tools. ? KEEP ARM ELEVATED THE REMAINDER OF THE DAY. 2. Do not push, pull or lift anything that is heavier than 10 lb for 5 days. 3. Ask your health care provider when it is okay to: ? Return to work or school. ? Resume usual physical activities or sports. ? Resume sexual  activity. General instructions  If the catheter site starts to bleed, raise your arm and put firm pressure on the site. If the bleeding does not stop, get help right away. This is a medical emergency.  DRINK PLENTY OF FLUIDS FOR THE NEXT 2-3 DAYS.  No alcohol consumption for 24 hours after receiving sedation.  If you went home on the same day as your procedure, a responsible adult should be with you for the first 24 hours after you arrive home.  Keep all follow-up visits as told by your health care provider. This is important. Contact a health care provider if:  You have a fever.  You have redness, swelling, or yellow drainage around your insertion site. Get help right away if:  You have unusual pain at the radial site.  The catheter insertion area swells very fast.  The insertion area is bleeding, and the bleeding does not stop when you hold steady pressure on the area.  Your arm or hand becomes pale, cool, tingly, or numb. These symptoms may represent a serious problem that is an emergency. Do not wait to see if the symptoms will go away. Get medical help right away. Call your local emergency services (911 in the [place] Do not drive yourself to the hospital. Summary  After the procedure, it is common to have bruising and tenderness at the site.  Follow instructions from your health care provider about how to take care   of your radial site wound. Check the wound every day for signs of infection.  This information is not intended to replace advice given to you by your health care provider. Make sure you discuss any questions you have with your health care provider. Document Revised: 08/08/2017 Document Reviewed: 08/08/2017 Elsevier Patient Education  2020 [employer] 

## 2020-10-11 ENCOUNTER — Telehealth: Payer: Self-pay | Admitting: Cardiology

## 2020-10-11 MED ORDER — PANTOPRAZOLE SODIUM 40 MG PO TBEC: 40.0000 mg | DELAYED_RELEASE_TABLET | Freq: Every day | ORAL | 3 refills | Status: DC

## 2020-10-11 MED FILL — Heparin Sod (Porcine)-NaCl IV Soln 1000 Unit/500ML-0.9%: INTRAVENOUS | Qty: 500 | Status: AC

## 2020-10-11 NOTE — Telephone Encounter (Signed)
Patient sent a MyChart message with another question for Dr. Dulce Sellar as well.   Dr. Leeann Must, my protonix expired last July and somehow, got lost as a medication. Do I still need it, because I am getting stomach pain and using an otc acid reducer daily.   I will route this to Dr. Dulce Sellar to get his advise on this as well.

## 2020-10-11 NOTE — Telephone Encounter (Signed)
Spoke to the patient just now and let her know Dr. Munley's recommendations. She verbalizes understanding and thanks me for calling her back.  

## 2020-10-11 NOTE — Addendum Note (Signed)
Addended by: Delorse Limber I on: 10/11/2020 03:06 PM   Modules accepted: Orders

## 2020-10-11 NOTE — Telephone Encounter (Signed)
Left message on patients voicemail to please return our call.   

## 2020-10-11 NOTE — Telephone Encounter (Signed)
I reviewed her coronary angiogram myself and I am comfortable that she has no obstruction

## 2020-10-11 NOTE — Telephone Encounter (Signed)
Patient states she had her cath on Friday and they did not see anything. She states that this happened last time she had the procedure, but then the surgeon saw a vessel constrict and new to put the stent in. She would like Dr. Dulce Sellar to know this and would like a call back from his nurse.

## 2020-10-11 NOTE — Telephone Encounter (Signed)
Resume protonix

## 2020-10-11 NOTE — Telephone Encounter (Signed)
Spoke to the patient just now and she let me know that she is concerned that the doctor may not have seen everything during her heart cath on Friday. She states that the last time she had a cath and her stent was placed the doctor did not see it "constrict" until the very end of the cath and this is when the stent was placed. She is worried that this "constriction" did not happen during her cath on Friday.  I tried several times to reassure her that the cath was normal and that both Dr. Dulce Sellar and Dr. Herbie Baltimore were in agreement with this but she is not satisfied and states that she is still concerned that something is wrong.   She states she is still having the same symptoms and had a chest pain episode yesterday that she had to take two Nitroglycerin tablets to relieve.   I will route to Dr. Dulce Sellar to advise.

## 2020-12-07 ENCOUNTER — Other Ambulatory Visit: Payer: Self-pay

## 2020-12-07 ENCOUNTER — Ambulatory Visit: Payer: Medicare Other | Admitting: Cardiology

## 2020-12-07 ENCOUNTER — Encounter: Payer: Self-pay | Admitting: Cardiology

## 2020-12-07 VITALS — BP 128/78 | HR 88 | Ht 65.5 in | Wt 150.2 lb

## 2020-12-07 DIAGNOSIS — E782 Mixed hyperlipidemia: Secondary | ICD-10-CM | POA: Diagnosis not present

## 2020-12-07 DIAGNOSIS — I10 Essential (primary) hypertension: Secondary | ICD-10-CM | POA: Diagnosis not present

## 2020-12-07 DIAGNOSIS — I2510 Atherosclerotic heart disease of native coronary artery without angina pectoris: Secondary | ICD-10-CM | POA: Diagnosis not present

## 2020-12-07 NOTE — Progress Notes (Signed)
Cardiology Office Note:    Date:  12/07/2020   ID:  Brandy Cervantes, DOB January 24, 1946, MRN [id number]  PCP:  Kyla Balzarine, PA-C  Cardiologist:  Norman Herrlich, MD    Referring MD: Kyla Balzarine, PA-C    ASSESSMENT:    1. CAD in native artery   2. Essential hypertension   3. Mixed hyperlipidemia    PLAN:    In order of problems listed above:  1. Stable recent coronary angiography continue medical therapy 2. BP at target continue current treatment 3. Continue high intensity statin   Next appointment: 6 months   Medication Adjustments/Labs and Tests Ordered: Current medicines are reviewed at length with the patient today.  Concerns regarding medicines are outlined above.  No orders of the defined types were placed in this encounter.  No orders of the defined types were placed in this encounter.   Chief Complaint  Patient presents with  . Follow-up  . Coronary Artery Disease    Recent coronary angiography with patent stent and mild stenosis right coronary artery 55%    History of Present Illness:    Samyah Cervantes is a 75 y.o. 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 Compliance with diet, lifestyle and medications: Yes  Since her recent coronary angiogram she has had no further chest pain.  In retrospect there is emotional trigger I have cautioned her to use nitroglycerin only in that circumstance.  No edema orthopnea palpitation or syncope and tolerates a reduced frequency statin  Recent left heart catheterization showed widely patent stent in the LAD mild progression in the mid right coronary lesion with stenosis of 50 to 55% not flow-limiting normal left ventricular ejection fraction and diastolic filling pressures.  She did not require further intervention.  Her husband is present participates in evaluation decision  making  10/08/2020: Coronary Diagrams   Diagnostic Dominance: Right      Past Medical History:  Diagnosis Date  . Abnormal EKG 01/24/2018  . Anxiety   . Anxiety and depression 01/08/2020  . Arthritis    "all over" (02/01/2018)  . 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 Bilateral knee pain 10/28/2013  . Bilateral primary osteoarthritis of knee 11/17/2016  . Bronchiectasis without complication (HCC) 07/30/2019   Formatting of this note might be different from the original. Chest CT 06/20/19  . CAD in native artery 02/04/2018  . Cellulitis of toe, right 09/20/2018  . Chest pain 03/03/2018  . Chronic back pain    "qwhere" (01/29/2018)  . Chronic pain 10/18/2015  . Corns of multiple toes 09/20/2018  . Decrease in appetite 01/06/2020  . Degenerative disc disease, cervical 10/28/2013  . Degenerative disc disease, lumbar 10/28/2013  . Depression   . Esophageal dysphagia 12/31/2017  . Essential hypertension 10/18/2015  . Exposure to TB    "my mother died when she was 73 of TB"  . Fatigue 01/02/2019  . Forgetfulness 12/22/2019  . GAD (generalized anxiety disorder) 10/18/2015  . GERD (gastroesophageal reflux disease) 12/31/2017  . History of gout    "I've had it occasionally" (11/23/2016)  . Hypertension   . Hypotension 07/24/2018  . Insomnia 10/18/2015  . Low back pain 10/28/2013  . Migraine    "a few/year; more in the last few weeks" (01/29/2018)  . Mixed hyperlipidemia 10/19/2017  . Neck pain 10/28/2013  . Osteoarthritis 10/18/2015  . Osteoporosis 10/18/2015  . Other chest pain  12/31/2017  . Other fatigue 01/02/2019  . Pain syndrome, chronic 10/28/2013  . Raynaud's disease    feet  . Shortness of breath 07/24/2018  . Tick bite 11/21/2018   Formatting of this note might be different from the original. 2020: right flank  . Unstable angina (HCC) 01/27/2018    Past Surgical History:  Procedure Laterality Date  . CARDIAC CATHETERIZATION    . CORONARY ANGIOPLASTY    . CORONARY STENT INTERVENTION N/A  01/29/2018   Procedure: CORONARY STENT INTERVENTION;  Surgeon: Lennette Bihari, MD;  Location: [facility] INVASIVE CV LAB;  Service: Cardiovascular;  Laterality: N/A;  . JOINT REPLACEMENT    . LEFT HEART CATH AND CORONARY ANGIOGRAPHY N/A 01/29/2018   Procedure: LEFT HEART CATH AND CORONARY ANGIOGRAPHY;  Surgeon: Lennette Bihari, MD;  Location: [facility] INVASIVE CV LAB;  Service: Cardiovascular;  Laterality: N/A;  . LEFT HEART CATH AND CORONARY ANGIOGRAPHY N/A 10/08/2020   Procedure: LEFT HEART CATH AND CORONARY ANGIOGRAPHY;  Surgeon: Marykay Lex, MD;  Location: [facility] INVASIVE CV LAB;  Service: Cardiovascular;  Laterality: N/A;  . LEFT HEART CATH AND CORS/GRAFTS ANGIOGRAPHY N/A 03/04/2018   Procedure: LEFT HEART CATH AND CORS/GRAFTS ANGIOGRAPHY;  Surgeon: [name], MD;  Location: [facility] INVASIVE CV LAB;  Service: Cardiovascular;  Laterality: N/A;  . MASTOIDECTOMY Right ~ 1955  . TOTAL KNEE ARTHROPLASTY Bilateral 11/22/2016   Procedure: TOTAL KNEE BILATERAL;  Surgeon: Gean Birchwood, MD;  Location: [facility] OR;  Service: Orthopedics;  Laterality: Bilateral;    Current Medications: Current Meds  Medication Sig  . Ascorbic Acid (VITAMIN C) 1000 MG tablet Take 1,000 mg by mouth daily at 12 noon.  Marland Kitchen aspirin EC 81 MG tablet Take 1 tablet (81 mg total) by mouth daily.  Marland Kitchen b complex vitamins capsule Take 1 capsule by mouth daily at 12 noon. Super  . Calcium-Magnesium-Vitamin D (CITRACAL CALCIUM+D) 600-40-500 MG-MG-UNIT TB24 Take 1 tablet by mouth daily at 12 noon.  . Cholecalciferol (VITAMIN D3) 1000 units CAPS Take 1,000 Units by mouth daily.  . clopidogrel (PLAVIX) 75 MG tablet TAKE 1 TABLET BY MOUTH DAILY  . Ferrous Sulfate (IRON PO) Take 25 mg by mouth daily.  Marland Kitchen HYDROcodone-acetaminophen (NORCO) 10-325 MG tablet Take 1 tablet by mouth every 6 (six) hours as needed for moderate pain.  . hydrOXYzine (ATARAX/VISTARIL) 25 MG tablet Take 25 mg by mouth 3 (three) times daily.  . isosorbide mononitrate (IMDUR) 60 MG 24 hr  tablet TAKE 1 TABLET BY MOUTH ONCE DAILY (Patient taking differently: Take 60 mg by mouth daily.)  . morphine (MS CONTIN) 30 MG 12 hr tablet Take 30 mg by mouth every 12 (twelve) hours.  . Multiple Vitamins-Minerals (PRESERVISION AREDS 2) CAPS Take 1 tablet by mouth daily at 12 noon.  . Multiple Vitamins-Minerals (SENIOR MULTIVITAMIN PLUS) TABS Take 1 tablet by mouth daily at 12 noon. With Iron  . nitroGLYCERIN (NITROSTAT) 0.4 MG SL tablet TAKE 1 TABLET UNDER THE TONGUE AS NEEDEDFOR CHEST PAIN ( MAY REPEAT EVERY 5 MINUTES X 3) (Patient taking differently: Place 0.4 mg under the tongue every 5 (five) minutes as needed for chest pain. ( MAY REPEAT EVERY 5 MINUTES X 3))  . OVER THE COUNTER MEDICATION Take 1 tablet by mouth at bedtime as needed (for sleep). Calms Forte  . OVER THE COUNTER MEDICATION Take 1,400 mg by mouth daily at 12 noon. liposomal glutathione/ 700 mg each  . oxymetazoline (AFRIN) 0.05 % nasal spray Place 1 spray into both nostrils at bedtime.  Marland Kitchen  pantoprazole (PROTONIX) 40 MG tablet Take 1 tablet (40 mg total) by mouth daily.  Marland Kitchen PARoxetine (PAXIL) 20 MG tablet Take 20 mg by mouth daily.  . rosuvastatin (CRESTOR) 20 MG tablet TAKE 1 TABLET BY MOUTH 2 TIMES A WEEK (Patient taking differently: Take 20 mg by mouth See admin instructions. Take 20 mg tues and Saturday at bedtime)     Allergies:   Aspirin, Lisinopril, Topiramate, Alendronate sodium, Celecoxib, Diphenhydramine hcl, Gabapentin, Nsaids, Pravastatin, and Sumatriptan   Social History   Socioeconomic History  . Marital status: Married    Spouse name: Not on file  . Number of children: Not on file  . Years of education: Not on file  . Highest education level: Not on file  Occupational History  . Not on file  Tobacco Use  . Smoking status: Never Smoker  . Smokeless tobacco: Never Used  Vaping Use  . Vaping Use: Never used  Substance and Sexual Activity  . Alcohol use: No  . Drug use: No  . Sexual activity: Not  Currently  Other Topics Concern  . Not on file  Social History Narrative  . Not on file   Social Determinants of Health   Financial Resource Strain: Not on file  Food Insecurity: Not on file  Transportation Needs: Not on file  Physical Activity: Not on file  Stress: Not on file  Social Connections: Not on file     Family History: The patient's family history includes Heart attack in her father; Heart disease in her brother; Hyperlipidemia in her father; Tuberculosis in her mother. ROS:   Please see the history of present illness.    All other systems reviewed and are negative.  EKGs/Labs/Other Studies Reviewed:    The following studies were reviewed today:    Recent Labs: 04/05/2020: ALT 13 10/04/2020: BUN 14; Creatinine, Ser 0.75; Hemoglobin 13.0; Platelets 282; Potassium 4.4; Sodium 140  Recent Lipid Panel    Component Value Date/Time   CHOL 197 10/04/2020 1010   TRIG 372 (H) 10/04/2020 1010   HDL 40 10/04/2020 1010   CHOLHDL 4.9 (H) 10/04/2020 1010   CHOLHDL 4.5 01/28/2018 0326   VLDL 42 (H) 01/28/2018 0326   LDLCALC 95 10/04/2020 1010    Physical Exam:    VS:  BP 128/78 (BP Location: Right Arm, Patient Position: Sitting)   Pulse 88   Ht 5' 5.5" (1.664 m)   Wt 150 lb 3.2 oz (68.1 kg)   SpO2 96%   BMI 24.61 kg/m     Wt Readings from Last 3 Encounters:  12/07/20 150 lb 3.2 oz (68.1 kg)  10/08/20 145 lb (65.8 kg)  10/04/20 147 lb (66.7 kg)     GEN:  Well nourished, well developed in no acute distress HEENT: Normal NECK: No JVD; No carotid bruits LYMPHATICS: No lymphadenopathy CARDIAC: RRR, no murmurs, rubs, gallops RESPIRATORY:  Clear to auscultation without rales, wheezing or rhonchi  ABDOMEN: Soft, non-tender, non-distended MUSCULOSKELETAL:  No edema; No deformity  SKIN: Warm and dry NEUROLOGIC:  Alert and oriented x 3 PSYCHIATRIC:  Normal affect    Signed, Norman Herrlich, MD  12/07/2020 10:24 AM    [facility]

## 2020-12-07 NOTE — Patient Instructions (Signed)

## 2021-01-18 ENCOUNTER — Telehealth: Payer: Self-pay | Admitting: Cardiology

## 2021-01-18 NOTE — Telephone Encounter (Signed)
Pt c/o BP issue: STAT if pt c/o blurred vision, one-sided weakness or slurred speech  1. What are your last 5 BP readings? 180/115  2. Are you having any other symptoms (ex. Dizziness, headache, blurred vision, passed out)? nausea  3. What is your BP issue? PT states she had  a episode last night to where the ambulance had to come out

## 2021-01-18 NOTE — Telephone Encounter (Signed)
Spoke to the patient just now and she let me know that she called an ambulance last night due to chest pain, blood pressure being high, and being nauseous. She states that they evaluated her and did an EKG that all came back normal. They re-evaluated her blood pressure and said that it came back better 137/90. At this point they left.   She is not having any chest pain or nausea/vomiting at this point in time. She took her blood pressure for me just now and it was  153/93. She is going to call us back if she had any issues like this again and is aware to call 911 if the pain were to come back.   Encouraged patient to call back with any questions or concerns.

## 2021-02-09 ENCOUNTER — Other Ambulatory Visit: Payer: Self-pay | Admitting: Cardiology

## 2021-03-22 NOTE — Progress Notes (Signed)
Cardiology Office Note:    Date:  03/23/2021   ID:  Brandy Cervantes, DOB [birth year], MRN [id number]  PCP:  Kyla Balzarine, PA-C  Cardiologist:  Norman Herrlich, MD    Referring MD: Kyla Balzarine, PA-C    ASSESSMENT:    1. CAD in native artery   2. Essential hypertension   3. Mixed hyperlipidemia    PLAN:    In order of problems listed above:  She continues to do well with occasional angina relieved with nitroglycerin continue treatment including her dual antiplatelet oral nitrate and statin. Stable repeat blood pressure at target encouraged her to check blood pressures at home and trend and continue treatment Lipids are a bit at target recheck CMP lipid profile   Next appointment: 6 months   Medication Adjustments/Labs and Tests Ordered: Current medicines are reviewed at length with the patient today.  Concerns regarding medicines are outlined above.  No orders of the defined types were placed in this encounter.  No orders of the defined types were placed in this encounter.   Chief Complaint  Patient presents with   Follow-up   Coronary Artery Disease     History of Present Illness:    Brandy Cervantes is a 75 y.o. 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  She was last seen 12/07/2020 following repeat coronary angiography 10/08/2020 with patent stent to LAD and nonflow limiting 55% proximal right coronary artery stenosis treated medically.  Compliance with diet, lifestyle and medications: Yes Predominant problem is chronic pain syndrome she is also quite sedentary. She has angina perhaps once a week without activity relieved with nitroglycerin and tolerates her cardiac medications without side effect.  She has no muscle pain or weakness from her statin. 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 Chest CT 06/20/19   CAD in native artery 02/04/2018   Cellulitis of toe, right 09/20/2018   Chest pain 03/03/2018   Chronic back pain    "qwhere" (01/29/2018)   Chronic pain 10/18/2015   Corns of multiple toes 09/20/2018   Decrease in appetite 01/06/2020   Degenerative disc disease, cervical 10/28/2013   Degenerative disc disease, lumbar 10/28/2013   Depression    Esophageal dysphagia 12/31/2017   Essential hypertension 10/18/2015   Exposure to TB    "my mother died when she was 32 of TB"   Fatigue 01/02/2019   Forgetfulness 12/22/2019   GAD (generalized anxiety disorder) 10/18/2015   GERD (gastroesophageal reflux disease) 12/31/2017   History of gout    "I've had it occasionally" (11/23/2016)   Hypertension    Hypotension 07/24/2018   Insomnia 10/18/2015   Low back pain 10/28/2013   Migraine    "a few/year; more in the last few weeks" (01/29/2018)   Mixed hyperlipidemia 10/19/2017   Neck pain 10/28/2013   Osteoarthritis 10/18/2015   Osteoporosis 10/18/2015   Other chest pain 12/31/2017   Other fatigue 01/02/2019   Pain syndrome, chronic 10/28/2013   Raynaud's disease    feet   Shortness of breath 07/24/2018   Tick bite 11/21/2018  Formatting of this note might be different from the original. 2020: right flank   Unstable angina (HCC) 01/27/2018    Past Surgical History:  Procedure Laterality Date   CARDIAC CATHETERIZATION     CORONARY ANGIOPLASTY     CORONARY STENT INTERVENTION N/A 01/29/2018   Procedure: CORONARY STENT INTERVENTION;  Surgeon: Lennette Bihari, MD;  Location: [facility];  Service: Cardiovascular;  Laterality: N/A;   JOINT REPLACEMENT     LEFT HEART CATH AND CORONARY ANGIOGRAPHY N/A 01/29/2018   Procedure: LEFT HEART  CATH AND CORONARY ANGIOGRAPHY;  Surgeon: Lennette Bihari, MD;  Location: [facility];  Service: Cardiovascular;  Laterality: N/A;   LEFT HEART CATH AND CORONARY ANGIOGRAPHY N/A 10/08/2020   Procedure: LEFT HEART CATH AND CORONARY ANGIOGRAPHY;  Surgeon: Marykay Lex, MD;  Location: [facility] IN[facility] CV [facility];  Service: Cardiovascular;  Laterality: N/A;   LEFT HEART CATH AND CORS/GRAFTS ANGIOGRAPHY N/A 03/04/2018   Procedure: LEFT HEART CATH AND CORS/GRAFTS ANGIOGRAPHY;  Surgeon: [name], MD;  Location: [facility];  Service: Cardiovascular;  Laterality: N/A;   MASTOIDECTOMY Right ~ 1955   TOTAL KNEE ARTHROPLASTY Bilateral 11/22/2016   Procedure: TOTAL KNEE BILATERAL;  Surgeon: Gean Birchwood, MD;  Location: [facility];  Service: Orthopedics;  Laterality: Bilateral;    Current Medications: No outpatient medications have been marked as taking for the 03/23/21 encounter (Office Visit) with Baldo Daub, MD.     Allergies:   Aspirin, Lisinopril, Topiramate, Alendronate sodium, Celecoxib, Diphenhydramine hcl, Gabapentin, Nsaids, Pravastatin, and Sumatriptan   Social History   Socioeconomic History   Marital status: Married    Spouse name: Not on file   Number of children: Not on file   Years of education: Not on file   Highest education level: Not on file  Occupational History   Not on file  Tobacco Use   Smoking status: Never   Smokeless tobacco: Never  Vaping Use   Vaping Use: Never used  Substance and Sexual Activity   Alcohol use: No   Drug use: No   Sexual activity: Not Currently  Other Topics Concern   Not on file  Social History Narrative   Not on file   Social Determinants of Health   Financial Resource Strain: Not on file  Food Insecurity: Not on file  Transportation Needs: Not on file  Physical Activity: Not on file  Stress: Not on file  Social Connections: Not on file     Family History: The patient's family history includes Heart attack in her father;  Heart disease in her brother; Hyperlipidemia in her father; Tuberculosis in her mother. ROS:   Please see the history of present illness.    All other systems reviewed and are negative.  EKGs/Labs/Other Studies Reviewed:    The following studies were reviewed today:    Recent Labs: 04/05/2020: ALT 13 10/04/2020: BUN 14; Creatinine, Ser 0.75; Hemoglobin 13.0; Platelets 282; Potassium 4.4; Sodium 140  Recent Lipid Panel    Component Value Date/Time   CHOL 197 10/04/2020 1010   TRIG 372 (H) 10/04/2020 1010   HDL 40 10/04/2020 1010   CHOLHDL 4.9 (H) 10/04/2020 1010   CHOLHDL 4.5 01/28/2018 0326   VLDL 42 (H) 01/28/2018 0326   LDLCALC 95 10/04/2020 1010    Physical Exam:    VS:  Ht 5' 5.5" (1.664 m)   Wt 151 lb (68.5 kg)   BMI 24.75 kg/m     Wt Readings from Last 3  Encounters:  03/23/21 151 lb (68.5 kg)  12/07/20 150 lb 3.2 oz (68.1 kg)  10/08/20 145 lb (65.8 kg)    Repeat blood pressure 132/82 GEN:  Well nourished, well developed in no acute distress HEENT: Normal NECK: No JVD; No carotid bruits LYMPHATICS: No lymphadenopathy CARDIAC: RRR, no murmurs, rubs, gallops RESPIRATORY:  Clear to auscultation without rales, wheezing or rhonchi  ABDOMEN: Soft, non-tender, non-distended MUSCULOSKELETAL:  No edema; No deformity  SKIN: Warm and dry NEUROLOGIC:  Alert and oriented x 3 PSYCHIATRIC:  Normal affect    Signed, Norman Herrlich, MD  03/23/2021 12:41 PM    [facility]

## 2021-03-23 ENCOUNTER — Encounter: Payer: Self-pay | Admitting: Cardiology

## 2021-03-23 ENCOUNTER — Other Ambulatory Visit: Payer: Self-pay

## 2021-03-23 ENCOUNTER — Ambulatory Visit: Payer: Medicare Other | Admitting: Cardiology

## 2021-03-23 VITALS — BP 160/92 | HR 72 | Ht 65.5 in | Wt 151.0 lb

## 2021-03-23 DIAGNOSIS — I2510 Atherosclerotic heart disease of native coronary artery without angina pectoris: Secondary | ICD-10-CM

## 2021-03-23 DIAGNOSIS — I10 Essential (primary) hypertension: Secondary | ICD-10-CM

## 2021-03-23 DIAGNOSIS — E782 Mixed hyperlipidemia: Secondary | ICD-10-CM | POA: Diagnosis not present

## 2021-03-23 NOTE — Addendum Note (Signed)
Addended by: Delorse Limber I on: 03/23/2021 01:10 PM   Modules accepted: Orders

## 2021-03-23 NOTE — Patient Instructions (Signed)

## 2021-03-24 ENCOUNTER — Telehealth: Payer: Self-pay

## 2021-03-24 ENCOUNTER — Other Ambulatory Visit: Payer: Self-pay | Admitting: Cardiology

## 2021-03-24 LAB — COMPREHENSIVE METABOLIC PANEL
ALT: 19 IU/L (ref 0–32)
AST: 32 IU/L (ref 0–40)
Albumin/Globulin Ratio: 2 (ref 1.2–2.2)
Albumin: 4.5 g/dL (ref 3.7–4.7)
Alkaline Phosphatase: 92 IU/L (ref 44–121)
BUN/Creatinine Ratio: 28 (ref 12–28)
BUN: 22 mg/dL (ref 8–27)
Bilirubin Total: 0.4 mg/dL (ref 0.0–1.2)
CO2: 27 mmol/L (ref 20–29)
Calcium: 9.5 mg/dL (ref 8.7–10.3)
Chloride: 102 mmol/L (ref 96–106)
Creatinine, Ser: 0.79 mg/dL (ref 0.57–1.00)
Globulin, Total: 2.3 g/dL (ref 1.5–4.5)
Glucose: 86 mg/dL (ref 65–99)
Potassium: 4.6 mmol/L (ref 3.5–5.2)
Sodium: 143 mmol/L (ref 134–144)
Total Protein: 6.8 g/dL (ref 6.0–8.5)
eGFR: 78 mL/min/{1.73_m2} (ref >59)

## 2021-03-24 LAB — LIPID PANEL
Chol/HDL Ratio: 3.5 ratio (ref 0.0–4.4)
Cholesterol, Total: 169 mg/dL (ref 100–199)
HDL: 48 mg/dL (ref >39)
LDL Chol Calc (NIH): 87 mg/dL (ref 0–99)
Triglycerides: 201 mg/dL — ABNORMAL HIGH (ref 0–149)
VLDL Cholesterol Cal: 34 mg/dL (ref 5–40)

## 2021-03-24 NOTE — Telephone Encounter (Signed)
Isosorbide mononitrate 60 mg # 90 x 3 refills sent to  [facility] - [place], [place] - [address]

## 2021-03-24 NOTE — Telephone Encounter (Signed)
-----   Message from Brian J Munley, MD sent at 03/24/2021 10:10 AM EDT ----- Regarding: FW: Good result no changes in treatment ----- Message ----- From: Interface, [facility] Lab Results In Sent: 03/24/2021   5:38 AM EDT To: Brian J Munley, MD  

## 2021-03-24 NOTE — Telephone Encounter (Signed)
Spoke with patient regarding results and recommendation.  Patient verbalizes understanding and is agreeable to plan of care. Advised patient to call back with any issues or concerns.  

## 2021-04-11 DIAGNOSIS — Z1211 Encounter for screening for malignant neoplasm of colon: Secondary | ICD-10-CM

## 2021-04-11 DIAGNOSIS — Z2821 Immunization not carried out because of patient refusal: Secondary | ICD-10-CM

## 2021-04-11 DIAGNOSIS — Z124 Encounter for screening for malignant neoplasm of cervix: Secondary | ICD-10-CM

## 2021-04-11 HISTORY — DX: Encounter for screening for malignant neoplasm of cervix: Z12.4

## 2021-04-11 HISTORY — DX: Encounter for screening for malignant neoplasm of colon: Z12.11

## 2021-04-11 HISTORY — DX: Immunization not carried out because of patient refusal: Z28.21

## 2021-04-20 ENCOUNTER — Encounter: Payer: Self-pay | Admitting: Gastroenterology

## 2021-05-11 ENCOUNTER — Other Ambulatory Visit: Payer: Self-pay | Admitting: Cardiology

## 2021-06-28 ENCOUNTER — Other Ambulatory Visit: Payer: Self-pay | Admitting: Cardiology

## 2021-06-28 ENCOUNTER — Telehealth: Payer: Self-pay | Admitting: Cardiology

## 2021-06-28 NOTE — Telephone Encounter (Signed)
Refill of Nitroglycerin 0.4 mg sent to [facility].

## 2021-06-28 NOTE — Telephone Encounter (Signed)
*  STAT* If patient is at the pharmacy, call can be transferred to refill team.   1. Which medications need to be refilled? (please list name of each medication and dose if known)  nitroGLYCERIN (NITROSTAT) 0.4 MG SL tablet  2. Which pharmacy/location (including street and city if local pharmacy) is medication to be sent to? [facility] - [place], [place] - [address]  3. Do they need a 30 day or 90 day supply? 30  

## 2021-07-11 ENCOUNTER — Other Ambulatory Visit: Payer: Self-pay | Admitting: Cardiology

## 2021-09-20 ENCOUNTER — Ambulatory Visit: Payer: Medicare Other | Admitting: Cardiology

## 2021-09-20 ENCOUNTER — Other Ambulatory Visit: Payer: Self-pay

## 2021-09-20 ENCOUNTER — Encounter: Payer: Self-pay | Admitting: Cardiology

## 2021-09-20 VITALS — BP 160/86 | HR 72 | Ht 63.0 in | Wt 153.0 lb

## 2021-09-20 DIAGNOSIS — I2510 Atherosclerotic heart disease of native coronary artery without angina pectoris: Secondary | ICD-10-CM

## 2021-09-20 DIAGNOSIS — E782 Mixed hyperlipidemia: Secondary | ICD-10-CM | POA: Diagnosis not present

## 2021-09-20 DIAGNOSIS — I10 Essential (primary) hypertension: Secondary | ICD-10-CM | POA: Diagnosis not present

## 2021-09-20 MED ORDER — NITROGLYCERIN 0.4 MG/SPRAY TL SOLN: 1.0000 | 12 refills | Status: DC | PRN

## 2021-09-20 MED ORDER — NITROGLYCERIN 0.4 MG SL SUBL: 0.4000 mg | SUBLINGUAL_TABLET | SUBLINGUAL | 6 refills | Status: DC | PRN

## 2021-09-20 NOTE — Progress Notes (Signed)
Cardiology Office Note:    Date:  09/20/2021   ID:  Brandy Cervantes, DOB May 10, 1946, MRN [id number]  PCP:  Delmer Islam, NP-C  Cardiologist:  Norman Herrlich, MD    Referring MD: Kyla Balzarine, PA-C    ASSESSMENT:    1. CAD in native artery   2. Essential hypertension   3. Mixed hyperlipidemia    PLAN:    In order of problems listed above:  Her course is stable currently despite PCI stent and medical therapy including dual antiplatelet oral nitrate lipid-lowering intensity statin is a stable pattern of nonexertional angina consistent with coronary vasospasm we will continue her current medical treatment she is comfortable taking nitroglycerin as needed switch to Nitrolingual spray to 100 doses per unit Stable continue current treatment Continue her statin Labs drawn  yesterday are pending for safety and efficacy   Next appointment: 9 months   Medication Adjustments/Labs and Tests Ordered: Current medicines are reviewed at length with the patient today.  Concerns regarding medicines are outlined above.  No orders of the defined types were placed in this encounter.  No orders of the defined types were placed in this encounter.   Chief Complaint  Patient presents with   Follow-up   Coronary Artery Disease    History of Present Illness:    Brandy Cervantes is a 76 y.o. 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  She was seen 12/07/2020 following repeat coronary angiography 10/08/2020 with patent stent to LAD and nonflow limiting 55% proximal right coronary artery stenosis treated medically.She was  last seen 03/23/2021.  Compliance with diet, lifestyle and medications: Yes  She is advised to transition from Protonix to Prilosec.  By her PCP, good idea Has a stable pattern of nonexertional angina takes 2 nitroglycerin a week with relief and we will switch her  to nitroglycerin spray She has no exertional chest pain edema palpitation or syncope. Labs were drawn yesterday at PCP office pending She tolerates her dual antiplatelet therapy without GI upset and lipid-lowering without muscle pain or weakness Past Medical History:  Diagnosis Date   Abnormal EKG 01/24/2018   Anxiety    Anxiety and depression 01/08/2020   Arthritis    "all over" (02/01/2018)   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 Chest CT 06/20/19   CAD in native artery 02/04/2018   Cellulitis of toe, right 09/20/2018   Chest pain 03/03/2018   Chronic back pain    "qwhere" (01/29/2018)   Chronic pain 10/18/2015   Corns of multiple toes 09/20/2018   Decrease in appetite 01/06/2020   Degenerative disc disease, cervical 10/28/2013   Degenerative disc disease, lumbar 10/28/2013   Depression    Esophageal dysphagia 12/31/2017   Essential hypertension 10/18/2015   Exposure to TB    "my mother died when she was 32 of TB"   Fatigue 01/02/2019   Forgetfulness 12/22/2019   GAD (generalized anxiety disorder) 10/18/2015   GERD (gastroesophageal reflux disease) 12/31/2017   History of gout    "I've had it occasionally" (11/23/2016)   Hypertension    Hypotension 07/24/2018   Insomnia 10/18/2015   Low back pain 10/28/2013   Migraine    "a few/year; more in the last few weeks" (  01/29/2018)   Mixed hyperlipidemia 10/19/2017   Neck pain 10/28/2013   Osteoarthritis 10/18/2015   Osteoporosis 10/18/2015   Other chest pain 12/31/2017   Other fatigue 01/02/2019   Pain syndrome, chronic 10/28/2013   Raynaud's disease    feet   Shortness of breath 07/24/2018   Tick bite 11/21/2018   Formatting of this note might be different from the original. 2020: right flank   Unstable angina (HCC) 01/27/2018    Past Surgical History:   Procedure Laterality Date   CARDIAC CATHETERIZATION     CORONARY ANGIOPLASTY     CORONARY STENT INTERVENTION N/A 01/29/2018   Procedure: CORONARY STENT INTERVENTION;  Surgeon: Lennette Bihari, MD;  Location: [facility];  Service: Cardiovascular;  Laterality: N/A;   JOINT REPLACEMENT     LEFT HEART CATH AND CORONARY ANGIOGRAPHY N/A 01/29/2018   Procedure: LEFT HEART CATH AND CORONARY ANGIOGRAPHY;  Surgeon: Lennette Bihari, MD;  Location: [facility];  Service: Cardiovascular;  Laterality: N/A;   LEFT HEART CATH AND CORONARY ANGIOGRAPHY N/A 10/08/2020   Procedure: LEFT HEART CATH AND CORONARY ANGIOGRAPHY;  Surgeon: Marykay Lex, MD;  Location: [facility] INVASIVE CV LAB;  Service: Cardiovascular;  Laterality: N/A;   LEFT HEART CATH AND CORS/GRAFTS ANGIOGRAPHY N/A 03/04/2018   Procedure: LEFT HEART CATH AND CORS/GRAFTS ANGIOGRAPHY;  Surgeon: [name], MD;  Location: [facility];  Service: Cardiovascular;  Laterality: N/A;   MASTOIDECTOMY Right ~ 1955   TOTAL KNEE ARTHROPLASTY Bilateral 11/22/2016   Procedure: TOTAL KNEE BILATERAL;  Surgeon: Gean Birchwood, MD;  Location: [facility];  Service: Orthopedics;  Laterality: Bilateral;    Current Medications: Current Meds  Medication Sig   albuterol (VENTOLIN HFA) 108 (90 Base) MCG/ACT inhaler Inhale into the lungs as needed.   Ascorbic Acid (VITAMIN C) 1000 MG tablet Take 1,000 mg by mouth daily at 12 noon.   aspirin EC 81 MG tablet Take 1 tablet (81 mg total) by mouth daily.   b complex vitamins capsule Take 1 capsule by mouth daily at 12 noon. Super   Calcium-Magnesium-Vitamin D (CITRACAL CALCIUM+D) 600-40-500 MG-MG-UNIT TB24 Take 1 tablet by mouth daily at 12 noon.   Cholecalciferol (VITAMIN D3) 1000 units CAPS Take 1,000 Units by mouth daily.   clopidogrel (PLAVIX) 75 MG tablet TAKE 1 TABLET BY MOUTH DAILY   CYMBALTA 60 MG capsule Take 60 mg by mouth daily.   Docusate Sodium (DSS) 100 MG CAPS Take by mouth as needed.   Ferrous  Sulfate (IRON PO) Take 25 mg by mouth daily.   gabapentin (NEURONTIN) 100 MG capsule Take 100 mg by mouth daily.   HYDROcodone-acetaminophen (NORCO) 10-325 MG tablet Take 1 tablet by mouth every 6 (six) hours as needed for moderate pain.   hydrOXYzine (ATARAX/VISTARIL) 25 MG tablet Take 25 mg by mouth 3 (three) times daily.   isosorbide mononitrate (IMDUR) 60 MG 24 hr tablet TAKE 1 TABLET BY MOUTH ONCE DAILY   morphine (MS CONTIN) 30 MG 12 hr tablet Take 30 mg by mouth every 12 (twelve) hours.   Multiple Vitamins-Minerals (PRESERVISION AREDS 2) CAPS Take 1 tablet by mouth daily at 12 noon.   Multiple Vitamins-Minerals (SENIOR MULTIVITAMIN PLUS) TABS Take 1 tablet by mouth daily at 12 noon. With Iron   OVER THE COUNTER MEDICATION Take 1 tablet by mouth at bedtime as needed (for sleep). Calms Forte   oxymetazoline (AFRIN) 0.05 % nasal spray Place 1 spray into both nostrils at bedtime.   pantoprazole (PROTONIX)  40 MG tablet Take 1 tablet (40 mg total) by mouth daily.   rosuvastatin (CRESTOR) 20 MG tablet TAKE 1 TABLET BY MOUTH 2 TIMES A WEEK   [DISCONTINUED] nitroGLYCERIN (NITROSTAT) 0.4 MG SL tablet Place 1 tablet (0.4 mg total) under the tongue every 5 (five) minutes as needed for chest pain.   [DISCONTINUED] OVER THE COUNTER MEDICATION Take 1,400 mg by mouth daily at 12 noon. liposomal glutathione/ 700 mg each   [DISCONTINUED] PARoxetine (PAXIL) 20 MG tablet Take 20 mg by mouth daily.     Allergies:   Aspirin, Lisinopril, Topiramate, Alendronate sodium, Celecoxib, Diphenhydramine hcl, Gabapentin, Nsaids, Pravastatin, and Sumatriptan   Social History   Socioeconomic History   Marital status: Married    Spouse name: Not on file   Number of children: Not on file   Years of education: Not on file   Highest education level: Not on file  Occupational History   Not on file  Tobacco Use   Smoking status: Never   Smokeless tobacco: Never  Vaping Use   Vaping Use: Never used  Substance and  Sexual Activity   Alcohol use: No   Drug use: No   Sexual activity: Not Currently  Other Topics Concern   Not on file  Social History Narrative   Not on file   Social Determinants of Health   Financial Resource Strain: Not on file  Food Insecurity: Not on file  Transportation Needs: Not on file  Physical Activity: Not on file  Stress: Not on file  Social Connections: Not on file     Family History: The patient's family history includes Heart attack in her father; Heart disease in her brother; Hyperlipidemia in her father; Tuberculosis in her mother. ROS:   Please see the history of present illness.    All other systems reviewed and are negative.  EKGs/Labs/Other Studies Reviewed:    The following studies were reviewed today:  EKG:  EKG ordered today and personally reviewed.  The ekg ordered today demonstrates sinus rhythm normal EKG  Recent Labs: 10/04/2020: Hemoglobin 13.0; Platelets 282 03/23/2021: ALT 19; BUN 22; Creatinine, Ser 0.79; Potassium 4.6; Sodium 143  Recent Lipid Panel    Component Value Date/Time   CHOL 169 03/23/2021 1311   TRIG 201 (H) 03/23/2021 1311   HDL 48 03/23/2021 1311   CHOLHDL 3.5 03/23/2021 1311   CHOLHDL 4.5 01/28/2018 0326   VLDL 42 (H) 01/28/2018 0326   LDLCALC 87 03/23/2021 1311    Physical Exam:    VS:  BP (!) 160/86    Pulse 72    Ht 5\' 3"  (1.6 m)    Wt 153 lb (69.4 kg)    SpO2 96%    BMI 27.10 kg/m     Wt Readings from Last 3 Encounters:  09/20/21 153 lb (69.4 kg)  03/23/21 151 lb (68.5 kg)  12/07/20 150 lb 3.2 oz (68.1 kg)     GEN:  Well nourished, well developed in no acute distress HEENT: Normal NECK: No JVD; No carotid bruits LYMPHATICS: No lymphadenopathy CARDIAC: RRR, no murmurs, rubs, gallops RESPIRATORY:  Clear to auscultation without rales, wheezing or rhonchi  ABDOMEN: Soft, non-tender, non-distended MUSCULOSKELETAL:  No edema; No deformity  SKIN: Warm and dry NEUROLOGIC:  Alert and oriented x 3 PSYCHIATRIC:   Normal affect    Signed, Norman Herrlich, MD  09/20/2021 1:36 PM     [facility]

## 2021-09-20 NOTE — Patient Instructions (Signed)
Medication Instructions:  ?Your physician recommends that you continue on your current medications as directed. Please refer to the Current Medication list given to you today. ? ?*If you need a refill on your cardiac medications before your next appointment, please call your pharmacy* ? ? ?Lab Work: ?NONE ?If you have labs (blood work) drawn today and your tests are completely normal, you will receive your results only by: ?MyChart Message (if you have MyChart) OR ?A paper copy in the mail ?If you have any lab test that is abnormal or we need to change your treatment, we will call you to review the results. ? ? ?Testing/Procedures: ?NONE ? ? ?Follow-Up: ?At [facility], you and your health needs are our priority.  As part of our continuing mission to provide you with exceptional heart care, we have created designated Provider Care Teams.  These Care Teams include your primary Cardiologist (physician) and Advanced Practice Providers (APPs -  Physician Assistants and Nurse Practitioners) who all work together to provide you with the care you need, when you need it. ? ?We recommend signing up for the patient portal called "MyChart".  Sign up information is provided on this After Visit Summary.  MyChart is used to connect with patients for Virtual Visits (Telemedicine).  Patients are able to view lab/test results, encounter notes, upcoming appointments, etc.  Non-urgent messages can be sent to your provider as well.   ?To learn more about what you can do with MyChart, go to [email or web address].   ? ?Your next appointment:   ?9 month(s) ? ?The format for your next appointment:   ?In Person ? ?Provider:   ?Brian Munley, MD   ? ? ?Other Instructions ?  ?

## 2021-10-10 ENCOUNTER — Other Ambulatory Visit: Payer: Self-pay | Admitting: Cardiology

## 2021-11-27 ENCOUNTER — Other Ambulatory Visit: Payer: Self-pay

## 2021-11-27 ENCOUNTER — Inpatient Hospital Stay (HOSPITAL_COMMUNITY)
Admission: EM | Admit: 2021-11-27 | Discharge: 2021-11-29 | DRG: 103 | Disposition: A | Discharge status: Home / Self Care | Payer: Medicare Other | Attending: Internal Medicine | Admitting: Internal Medicine

## 2021-11-27 ENCOUNTER — Emergency Department (HOSPITAL_COMMUNITY): Payer: Medicare Other

## 2021-11-27 ENCOUNTER — Encounter (HOSPITAL_COMMUNITY): Payer: Self-pay | Admitting: Emergency Medicine

## 2021-11-27 DIAGNOSIS — Z79899 Other long term (current) drug therapy: Secondary | ICD-10-CM

## 2021-11-27 DIAGNOSIS — R1314 Dysphagia, pharyngoesophageal phase: Secondary | ICD-10-CM | POA: Diagnosis present

## 2021-11-27 DIAGNOSIS — M5030 Other cervical disc degeneration, unspecified cervical region: Secondary | ICD-10-CM | POA: Diagnosis present

## 2021-11-27 DIAGNOSIS — H539 Unspecified visual disturbance: Secondary | ICD-10-CM | POA: Diagnosis not present

## 2021-11-27 DIAGNOSIS — G8929 Other chronic pain: Secondary | ICD-10-CM | POA: Diagnosis present

## 2021-11-27 DIAGNOSIS — M5416 Radiculopathy, lumbar region: Secondary | ICD-10-CM | POA: Diagnosis present

## 2021-11-27 DIAGNOSIS — F419 Anxiety disorder, unspecified: Secondary | ICD-10-CM | POA: Diagnosis present

## 2021-11-27 DIAGNOSIS — K219 Gastro-esophageal reflux disease without esophagitis: Secondary | ICD-10-CM | POA: Diagnosis present

## 2021-11-27 DIAGNOSIS — M109 Gout, unspecified: Secondary | ICD-10-CM | POA: Diagnosis present

## 2021-11-27 DIAGNOSIS — I10 Essential (primary) hypertension: Secondary | ICD-10-CM | POA: Diagnosis present

## 2021-11-27 DIAGNOSIS — Z886 Allergy status to analgesic agent status: Secondary | ICD-10-CM

## 2021-11-27 DIAGNOSIS — Z888 Allergy status to other drugs, medicaments and biological substances status: Secondary | ICD-10-CM

## 2021-11-27 DIAGNOSIS — I7300 Raynaud's syndrome without gangrene: Secondary | ICD-10-CM | POA: Diagnosis present

## 2021-11-27 DIAGNOSIS — F32A Depression, unspecified: Secondary | ICD-10-CM | POA: Diagnosis present

## 2021-11-27 DIAGNOSIS — I2510 Atherosclerotic heart disease of native coronary artery without angina pectoris: Secondary | ICD-10-CM | POA: Diagnosis present

## 2021-11-27 DIAGNOSIS — E782 Mixed hyperlipidemia: Secondary | ICD-10-CM | POA: Diagnosis present

## 2021-11-27 DIAGNOSIS — G43109 Migraine with aura, not intractable, without status migrainosus: Principal | ICD-10-CM

## 2021-11-27 DIAGNOSIS — Z20822 Contact with and (suspected) exposure to covid-19: Secondary | ICD-10-CM | POA: Diagnosis present

## 2021-11-27 DIAGNOSIS — Z8249 Family history of ischemic heart disease and other diseases of the circulatory system: Secondary | ICD-10-CM

## 2021-11-27 DIAGNOSIS — G459 Transient cerebral ischemic attack, unspecified: Secondary | ICD-10-CM

## 2021-11-27 DIAGNOSIS — E8809 Other disorders of plasma-protein metabolism, not elsewhere classified: Secondary | ICD-10-CM | POA: Diagnosis present

## 2021-11-27 DIAGNOSIS — Z83438 Family history of other disorder of lipoprotein metabolism and other lipidemia: Secondary | ICD-10-CM

## 2021-11-27 DIAGNOSIS — M810 Age-related osteoporosis without current pathological fracture: Secondary | ICD-10-CM | POA: Diagnosis present

## 2021-11-27 DIAGNOSIS — Z831 Family history of other infectious and parasitic diseases: Secondary | ICD-10-CM

## 2021-11-27 DIAGNOSIS — Z833 Family history of diabetes mellitus: Secondary | ICD-10-CM

## 2021-11-27 DIAGNOSIS — Z955 Presence of coronary angioplasty implant and graft: Secondary | ICD-10-CM

## 2021-11-27 DIAGNOSIS — Z7982 Long term (current) use of aspirin: Secondary | ICD-10-CM

## 2021-11-27 DIAGNOSIS — H3411 Central retinal artery occlusion, right eye: Secondary | ICD-10-CM | POA: Diagnosis present

## 2021-11-27 HISTORY — DX: Transient cerebral ischemic attack, unspecified: G45.9

## 2021-11-27 LAB — COMPREHENSIVE METABOLIC PANEL
ALT: 20 U/L (ref 0–44)
AST: 30 U/L (ref 15–41)
Albumin: 3.4 g/dL — ABNORMAL LOW (ref 3.5–5.0)
Alkaline Phosphatase: 84 U/L (ref 38–126)
Anion gap: 5 (ref 5–15)
BUN: 14 mg/dL (ref 8–23)
CO2: 29 mmol/L (ref 22–32)
Calcium: 9.1 mg/dL (ref 8.9–10.3)
Chloride: 104 mmol/L (ref 98–111)
Creatinine, Ser: 0.73 mg/dL (ref 0.44–1.00)
GFR, Estimated: >60 mL/min (ref >60)
Glucose, Bld: 91 mg/dL (ref 70–99)
Potassium: 4.3 mmol/L (ref 3.5–5.1)
Sodium: 138 mmol/L (ref 135–145)
Total Bilirubin: 0.8 mg/dL (ref 0.3–1.2)
Total Protein: 6.2 g/dL — ABNORMAL LOW (ref 6.5–8.1)

## 2021-11-27 LAB — I-STAT CHEM 8, ED
BUN: 16 mg/dL (ref 8–23)
Calcium, Ion: 1.21 mmol/L (ref 1.15–1.40)
Chloride: 100 mmol/L (ref 98–111)
Creatinine, Ser: 0.7 mg/dL (ref 0.44–1.00)
Glucose, Bld: 91 mg/dL (ref 70–99)
HCT: 41 % (ref 36.0–46.0)
Hemoglobin: 13.9 g/dL (ref 12.0–15.0)
Potassium: 4.3 mmol/L (ref 3.5–5.1)
Sodium: 139 mmol/L (ref 135–145)
TCO2: 31 mmol/L (ref 22–32)

## 2021-11-27 LAB — RAPID URINE DRUG SCREEN, HOSP PERFORMED
Amphetamines: NOT DETECTED
Barbiturates: NOT DETECTED
Benzodiazepines: NOT DETECTED
Cocaine: NOT DETECTED
Opiates: POSITIVE — AB
Tetrahydrocannabinol: NOT DETECTED

## 2021-11-27 LAB — CBC
HCT: 40.9 % (ref 36.0–46.0)
Hemoglobin: 13.7 g/dL (ref 12.0–15.0)
MCH: 30.6 pg (ref 26.0–34.0)
MCHC: 33.5 g/dL (ref 30.0–36.0)
MCV: 91.3 fL (ref 80.0–100.0)
Platelets: 313 10*3/uL (ref 150–400)
RBC: 4.48 MIL/uL (ref 3.87–5.11)
RDW: 12.4 % (ref 11.5–15.5)
WBC: 8 10*3/uL (ref 4.0–10.5)
nRBC: 0 % (ref 0.0–0.2)

## 2021-11-27 LAB — URINALYSIS, ROUTINE W REFLEX MICROSCOPIC
Bacteria, UA: NONE SEEN
Bilirubin Urine: NEGATIVE
Glucose, UA: NEGATIVE mg/dL
Hgb urine dipstick: NEGATIVE
Ketones, ur: NEGATIVE mg/dL
Nitrite: NEGATIVE
Protein, ur: NEGATIVE mg/dL
Specific Gravity, Urine: 1.003 — ABNORMAL LOW (ref 1.005–1.030)
pH: 7 (ref 5.0–8.0)

## 2021-11-27 LAB — DIFFERENTIAL
Abs Immature Granulocytes: 0.01 10*3/uL (ref 0.00–0.07)
Basophils Absolute: 0.1 10*3/uL (ref 0.0–0.1)
Basophils Relative: 2 %
Eosinophils Absolute: 0.3 10*3/uL (ref 0.0–0.5)
Eosinophils Relative: 4 %
Immature Granulocytes: 0 %
Lymphocytes Relative: 42 %
Lymphs Abs: 3.3 10*3/uL (ref 0.7–4.0)
Monocytes Absolute: 0.7 10*3/uL (ref 0.1–1.0)
Monocytes Relative: 9 %
Neutro Abs: 3.5 10*3/uL (ref 1.7–7.7)
Neutrophils Relative %: 43 %

## 2021-11-27 LAB — C-REACTIVE PROTEIN: CRP: 0.7 mg/dL (ref <1.0)

## 2021-11-27 LAB — HEMOGLOBIN A1C
Hgb A1c MFr Bld: 5.6 % (ref 4.8–5.6)
Mean Plasma Glucose: 114.02 mg/dL

## 2021-11-27 LAB — RESP PANEL BY RT-PCR (FLU A&B, COVID) ARPGX2
Influenza A by PCR: NEGATIVE
Influenza B by PCR: NEGATIVE
SARS Coronavirus 2 by RT PCR: NEGATIVE

## 2021-11-27 LAB — PROTIME-INR
INR: 0.9 (ref 0.8–1.2)
Prothrombin Time: 12.2 seconds (ref 11.4–15.2)

## 2021-11-27 LAB — APTT: aPTT: 30 seconds (ref 24–36)

## 2021-11-27 LAB — LIPID PANEL
Cholesterol: 233 mg/dL — ABNORMAL HIGH (ref 0–200)
HDL: 52 mg/dL (ref >40)
LDL Cholesterol: 124 mg/dL — ABNORMAL HIGH (ref 0–99)
Total CHOL/HDL Ratio: 4.5 RATIO
Triglycerides: 286 mg/dL — ABNORMAL HIGH (ref <150)
VLDL: 57 mg/dL — ABNORMAL HIGH (ref 0–40)

## 2021-11-27 LAB — TSH: TSH: 1.353 u[IU]/mL (ref 0.350–4.500)

## 2021-11-27 LAB — SEDIMENTATION RATE: Sed Rate: 5 mm/hr (ref 0–22)

## 2021-11-27 MED ORDER — SODIUM CHLORIDE 0.9 % IV SOLN
100.0000 mL/h | INTRAVENOUS | Status: DC
  Administered 2021-11-27: 100 mL/h via INTRAVENOUS

## 2021-11-27 MED ORDER — ISOSORBIDE MONONITRATE ER 60 MG PO TB24
60.0000 mg | ORAL_TABLET | Freq: Every day | ORAL | Status: DC
  Administered 2021-11-27 – 2021-11-29 (×3): 60 mg via ORAL
  Filled 2021-11-27: qty 1
  Filled 2021-11-27: qty 2
  Filled 2021-11-27: qty 1
  Filled 2021-11-27: qty 2

## 2021-11-27 MED ORDER — SODIUM CHLORIDE 0.9 % IV BOLUS
500.0000 mL | Freq: Once | INTRAVENOUS | Status: AC
  Administered 2021-11-27: 500 mL via INTRAVENOUS

## 2021-11-27 MED ORDER — DULOXETINE HCL 60 MG PO CPEP
60.0000 mg | ORAL_CAPSULE | Freq: Every day | ORAL | Status: DC
  Administered 2021-11-27 – 2021-11-29 (×3): 60 mg via ORAL
  Filled 2021-11-27 (×3): qty 1

## 2021-11-27 MED ORDER — MORPHINE SULFATE ER 30 MG PO TBCR: 30.0000 mg | EXTENDED_RELEASE_TABLET | Freq: Two times a day (BID) | ORAL | Status: DC

## 2021-11-27 MED ORDER — SENNOSIDES-DOCUSATE SODIUM 8.6-50 MG PO TABS
1.0000 | ORAL_TABLET | Freq: Every evening | ORAL | Status: DC | PRN
  Administered 2021-11-28: 1 via ORAL
  Filled 2021-11-27: qty 1

## 2021-11-27 MED ORDER — ASPIRIN EC 81 MG PO TBEC
81.0000 mg | DELAYED_RELEASE_TABLET | Freq: Every day | ORAL | Status: DC
  Administered 2021-11-27 – 2021-11-29 (×3): 81 mg via ORAL
  Filled 2021-11-27 (×3): qty 1

## 2021-11-27 MED ORDER — ACETAMINOPHEN 325 MG PO TABS: 650.0000 mg | ORAL_TABLET | Freq: Four times a day (QID) | ORAL | Status: DC | PRN

## 2021-11-27 MED ORDER — ONDANSETRON HCL 4 MG PO TABS: 4.0000 mg | ORAL_TABLET | Freq: Four times a day (QID) | ORAL | Status: DC | PRN

## 2021-11-27 MED ORDER — PREDNISONE 20 MG PO TABS: 40.0000 mg | ORAL_TABLET | Freq: Every day | ORAL | Status: DC

## 2021-11-27 MED ORDER — SODIUM CHLORIDE 0.9 % IV SOLN
500.0000 mg | INTRAVENOUS | Status: DC
  Administered 2021-11-27: 500 mg via INTRAVENOUS
  Filled 2021-11-27: qty 4
  Filled 2021-11-27: qty 8

## 2021-11-27 MED ORDER — NITROGLYCERIN 0.4 MG SL SUBL: 0.4000 mg | SUBLINGUAL_TABLET | SUBLINGUAL | Status: DC | PRN

## 2021-11-27 MED ORDER — HYDRALAZINE HCL 20 MG/ML IJ SOLN: 10.0000 mg | Freq: Three times a day (TID) | INTRAMUSCULAR | Status: DC | PRN

## 2021-11-27 MED ORDER — ENOXAPARIN SODIUM 40 MG/0.4ML IJ SOSY
40.0000 mg | PREFILLED_SYRINGE | INTRAMUSCULAR | Status: DC
  Administered 2021-11-27 – 2021-11-28 (×2): 40 mg via SUBCUTANEOUS
  Filled 2021-11-27 (×2): qty 0.4

## 2021-11-27 MED ORDER — MORPHINE SULFATE ER 30 MG PO TBCR
30.0000 mg | EXTENDED_RELEASE_TABLET | Freq: Two times a day (BID) | ORAL | Status: DC
  Administered 2021-11-27 – 2021-11-29 (×4): 30 mg via ORAL
  Filled 2021-11-27 (×4): qty 1

## 2021-11-27 MED ORDER — GABAPENTIN 100 MG PO CAPS
100.0000 mg | ORAL_CAPSULE | Freq: Every day | ORAL | Status: DC
  Administered 2021-11-27 – 2021-11-29 (×3): 100 mg via ORAL
  Filled 2021-11-27 (×3): qty 1

## 2021-11-27 MED ORDER — ACETAMINOPHEN 650 MG RE SUPP
650.0000 mg | Freq: Four times a day (QID) | RECTAL | Status: DC | PRN
Start: 2021-11-27 — End: 2021-11-29

## 2021-11-27 MED ORDER — ONDANSETRON HCL 4 MG/2ML IJ SOLN
4.0000 mg | Freq: Four times a day (QID) | INTRAMUSCULAR | Status: DC | PRN
Start: 2021-11-27 — End: 2021-11-29

## 2021-11-27 MED ORDER — OXYMETAZOLINE HCL 0.05 % NA SOLN
1.0000 | Freq: Every day | NASAL | Status: DC
  Administered 2021-11-27: 1 via NASAL
  Filled 2021-11-27 (×2): qty 30

## 2021-11-27 MED ORDER — ISOSORBIDE MONONITRATE ER 30 MG PO TB24: 60.0000 mg | ORAL_TABLET | Freq: Every day | ORAL | Status: DC

## 2021-11-27 MED ORDER — PANTOPRAZOLE SODIUM 40 MG PO TBEC
40.0000 mg | DELAYED_RELEASE_TABLET | Freq: Every day | ORAL | Status: DC
  Administered 2021-11-27 – 2021-11-29 (×3): 40 mg via ORAL
  Filled 2021-11-27 (×3): qty 1

## 2021-11-27 MED ORDER — VITAMIN D 25 MCG (1000 UNIT) PO TABS
1000.0000 [IU] | ORAL_TABLET | Freq: Every day | ORAL | Status: DC
  Administered 2021-11-27 – 2021-11-29 (×3): 1000 [IU] via ORAL
  Filled 2021-11-27 (×3): qty 1

## 2021-11-27 MED ORDER — HYDROCODONE-ACETAMINOPHEN 10-325 MG PO TABS
1.0000 | ORAL_TABLET | Freq: Four times a day (QID) | ORAL | Status: DC | PRN
  Administered 2021-11-27 – 2021-11-29 (×5): 1 via ORAL
  Filled 2021-11-27 (×5): qty 1

## 2021-11-27 MED ORDER — HYDROXYZINE HCL 25 MG PO TABS
25.0000 mg | ORAL_TABLET | Freq: Two times a day (BID) | ORAL | Status: DC
  Administered 2021-11-28 – 2021-11-29 (×4): 25 mg via ORAL
  Filled 2021-11-27 (×4): qty 1

## 2021-11-27 MED ORDER — HYDROXYZINE HCL 25 MG PO TABS
50.0000 mg | ORAL_TABLET | Freq: Every day | ORAL | Status: DC
  Administered 2021-11-27 – 2021-11-28 (×2): 50 mg via ORAL
  Filled 2021-11-27 (×2): qty 2

## 2021-11-27 MED ORDER — ROSUVASTATIN CALCIUM 20 MG PO TABS
20.0000 mg | ORAL_TABLET | ORAL | Status: DC
  Administered 2021-11-28: 20 mg via ORAL
  Filled 2021-11-27: qty 1

## 2021-11-27 NOTE — H&P (Addendum)
?Date: 11/27/2021     ?     ?     ?Patient Name:  Brandy Cervantes MRN: [id number]  ?DOB: 1946-02-05 Age / Sex: 76 y.o., female   ?PCP: Merry Lofty, NP-C    ?     ?     ?Medical Service: Internal Medicine Teaching Service    ?     ?     ?Attending Physician: Dr. Sid Falcon, MD    ?First Contact: Pollyann Savoy, [place] [address] Pager: [phone]  ?Second Contact: Dr. Coy Saunas Pager: [phone]  ?     ?     ?After Hours (After 5p/  First Contact Pager: [phone]  ?weekends / holidays): Second Contact Pager: [phone]  ? ?Chief Complaint: vision changes ? ?History of Present Illness: This is a 76 year old female with a past medical history of chronic pain, DDD of cervical and lumbar spine, CAD s/p DES in LAD, HTN, HLD, GERD, esophageal dysphagia, anxiety / depression, osteoporosis, and osteoarthritis who presented for evaluation of vision changes.  ? ?She reports being in her usual state of health when she experienced sudden onset painless "shiny twinklies" in her right eye. She describes seeing multiple blinking lights / crystals out of her right eye for a few hours, which resolved after a nap. Denies any black spots or floaters in her vision and reports that her left eye was unaffected. After the blinking lights improved, she felt that the vision out of her right eye was blurred, particularly while reading such that certain letters of words in the center of her visual field would seem to disappear unless she moved her gaze and then they would re-appear. It felt like an area of blurriness at the center of her visual field that would migrate to the right. Denies any improvement with blinking. Of note, she does report having headaches 24/7 but they are usually at the back of her head and related to her degenerative disc disease of the cervical spine. Today, she noticed pain at her right temple region. Denies any slurred speech, facial droop, or weakness. Does endorse new onset tingling in her bilateral hands and feet but thinks this is  related to degenerative disc disease. Denies any recent illnesses, sick contacts, or travel. She often spends time outside in her garden and [place] but has not noticed any tick bites.  ? ?Patient endorses being near sighted and wears glasses occasionally but has not had any other issues with her vision. No history of cataracts. No prior episodes of visual disturbances. No other neurologic conditions. No history of stroke.  ? ?Due to her symptoms, she initially presented to Urgent Care yesterday and they had concern for retinal detachment so they advised her to present to the ED urgently. In the ED, she was found to have BP of 197/100 but all other vitals were within normal limits. MRI head negative for any acute intracranial abnormality and notable for minimal chronic small vessel ischemic disease. CBC and ESR were both within normal limits. CMP with mild hypoalbuminemia. UDS positive for opiates. UA with trace leukocytes.  ? ?Meds:  ?Aspirin 81 mg daily ?Omeprazole 40 mg daily ?Duloxetine 60 mg daily ?Gabapentin 100 mg daily ?Hydroxyzine 25 mg BID ?Rosuvastatin 20 mg twice weekly ?Imdur 60 mg daily ?Norco 10-325 q6h PRN ?Morphine 30 mg BID ?Albuterol PRN ?Vitamins B, C, D, and multivitamin ?Nitroglycerin PRN  ?Docusate PRN ? ? ?Allergies: ?Allergies as of 11/27/2021 - Review Complete 11/27/2021  ?Allergen  Reaction Noted  ? Aspirin Nausea Only and Other (See Comments) 09/16/2013  ? Lisinopril Cough 10/18/2015  ? Topiramate  10/18/2015  ? Alendronate sodium Nausea And Vomiting 10/18/2015  ? Celecoxib Nausea Only 01/02/2018  ? Diphenhydramine hcl Other (See Comments) and Rash 11/22/2016  ? Gabapentin Nausea Only and Other (See Comments) 11/22/2016  ? Nsaids Nausea Only 01/02/2018  ? Pravastatin Rash 10/18/2015  ? Sumatriptan Rash 10/18/2015  ? ?Past Medical History:  ?Diagnosis Date  ? Abnormal EKG 01/24/2018  ? Anxiety   ? Anxiety and depression 01/08/2020  ? Arthritis   ? "all over" (02/01/2018)  ?  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 Bilateral knee pain 10/28/2013  ? Bilateral primary osteoarthritis of knee 11/17/2016  ? Bronchiectasis without complication ([place]) 5/52/0802  ? Formatting of this note might be different from the original. Chest CT 06/20/19  ? CAD in native artery 02/04/2018  ? Cellulitis of toe, right 09/20/2018  ? Chest pain 03/03/2018  ? Chronic back pain   ? "qwhere" (01/29/2018)  ? Chronic pain 10/18/2015  ? Corns of multiple toes 09/20/2018  ? Decrease in appetite 01/06/2020  ? Degenerative disc disease, cervical 10/28/2013  ? Degenerative disc disease, lumbar 10/28/2013  ? Depression   ? Esophageal dysphagia 12/31/2017  ? Essential hypertension 10/18/2015  ? Exposure to TB   ? "my mother died when she was 89 of TB"  ? Fatigue 01/02/2019  ? Forgetfulness 12/22/2019  ? GAD (generalized anxiety disorder) 10/18/2015  ? GERD (gastroesophageal reflux disease) 12/31/2017  ? History of gout   ? "I've had it occasionally" (11/23/2016)  ? Hypertension   ? Hypotension 07/24/2018  ? Insomnia 10/18/2015  ? Low back pain 10/28/2013  ? Migraine   ? "a few/year; more in the last few weeks" (01/29/2018)  ? Mixed hyperlipidemia 10/19/2017  ? Neck pain 10/28/2013  ? Osteoarthritis 10/18/2015  ? Osteoporosis 10/18/2015  ? Other chest pain 12/31/2017  ? Other fatigue 01/02/2019  ? Pain syndrome, chronic 10/28/2013  ? Raynaud's disease   ? feet  ? Shortness of breath 07/24/2018  ? Tick bite 11/21/2018  ? Formatting of this note might be different from the original. 2020: right flank  ? Unstable angina ([place]) 01/27/2018  ? ? ?Family History:  ?Diabetes mellitus in cousins ?Possible history of HTN, cannot recall which family members ?Heart problems secondary to medication in brother ?Tuberculosis in mother  ?MI and HLD in father  ?No known family history of malignancy ? ?Social History: Patient lives with her spouse and two dogs in a home near [place]. She is able to perform all ADLs and IADLs independently. Endorses occasional alcohol use but  denies smoking or use of any other recreational substances.  ? ?Review of Systems: ?A complete ROS was negative except as per HPI.  ? ?Physical Exam: ?Blood pressure (!) 197/110, pulse 75, temperature 98.2 ?F (36.8 ?C), temperature source Oral, resp. rate 20, SpO2 97 %. ?General: Well appearing and in no acute distress ?Neuro: A&O x3, normal affect. CN II-VII and IX-XII intact but patient reports superior eye pain bilaterally with left gaze. Unable to hear from right ear, left sided hearing intact. Peripheral vision intact. Strength 5/5 in bilateral upper and lower extremities. Finger to nose intact. Rapid alternating movements of hands and feet intact.  ?HEENT: Normocephalic, atraumatic, PERRLA, EOMI, red reflex present and symmetric, normal sclerae without scleral icterus or injection, no hypopyon. Moist mucous membranes. Clear oropharynx. Mild ttp of the R temple.  ?Cardiovascular: RRR, no  m/r/g. Normal S1 and S2 without S3 or S4. Pulses 2+ in bilateral UE and LE. No peripheral edema ?Pulmonary: CTAB, no wheezes, rhonchi or rales. Normal WOB, no clubbing  ?Abdominal: Abdomen soft and non-distended. Normoactive bowel sounds. No tenderness to palpation ?Skin: Warm and dry with no rashes, cuts, or bruises ?MSK: Normal ROM of all extremities.  ? ?Visual acuity: Bilateral 20/20, left 20/20, right 20/25 ? ?EKG: personally reviewed my interpretation is normal sinus rhythm with inferior Q waves, stable from prior.  ? ? ?Assessment & Plan by Problem: ?Active Problems: ?  * No active hospital problems. * ? ?This is a 76 year old female with a past medical history of chronic pain, DDD of cervical and lumbar spine, CAD s/p DES in LAD, HTN, HLD, GERD, esophageal dysphagia, anxiety / depression, osteoporosis, and osteoarthritis who presented for evaluation of vision changes.  ? ?Painless monocular visual disturbance   ?Patient with history of myopia presented with acute onset painless blinking lights without black spots or  floaters, followed by mildly blurred vision. MRI head negative for any acute intracranial abnormality and notable for minimal chronic small vessel ischemic disease. CBC and CMP unremarkable. ESR within normal lim

## 2021-11-27 NOTE — Consult Note (Signed)
?Neurology Consultation   ? ?Reason for Consult:visual changes ? ?CC: "twinkly vision" and "head pain" behind right eye x 1 day  ? ?HISTORY OF PRESENT ILLNESS   ?HPI Brandy Cervantes is a pleasant 76 year old lady with a history of osteoarthritis, CAD s/p LAD stent on ASA 81 mg, cervical disc disease with chronic pain, lumbar disc disease, HTN, anxiety, insomnia, HLD, Raynaud's Phenomena, and gout presenting for transient monocular visual obscuration followed by right retro-orbital headache.  ? ?She was in her usual state of health until last evening, when she was putting shoes away in her closet. After having been hunched over x minutes, she stood up, at which time she noted "twinkling" in her vision on the right alone with cross cover testing. This was present x 1 hour, after which time she took  a nap. When she awake, the visual changes had ceased but she had a retro-orbital headache on the right, which is different than her usual holocephalic head pain. This headache has persisted with mild right temporal pain. Visual changes have not returned. ? ?+mild jaw pain, right mandibular pain, but states these are not new although cannot ascertain onset.  ? ?ROS + numbness in BL UE and LE symmetrically x minutes with spontaneous termination and without recurrence.  ? ?The patient denies dizziness, new problems with swallowing or speech, focal muscle weakness, abnormal movements, or other focal neurological deficits. ? ?History is obtained from: patient.  ? ?Premorbid modified Rankin scale (mRS):  ?0-Completely asymptomatic and back to baseline post-stroke ? ?ROS: Full ROS was performed and is negative except as noted in the HPI.  ? ?PAST MEDICAL HISTORY   ? ?Past Medical History:  ?Past Medical History:  ?Diagnosis Date  ? Abnormal EKG 01/24/2018  ? Anxiety   ? Anxiety and depression 01/08/2020  ? Arthritis   ? "all over" (02/01/2018)  ? 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 Bilateral knee pain 10/28/2013  ? Bilateral  primary osteoarthritis of knee 11/17/2016  ? Bronchiectasis without complication ([place]) 11/07/5359  ? Formatting of this note might be different from the original. Chest CT 06/20/19  ? CAD in native artery 02/04/2018  ? Cellulitis of toe, right 09/20/2018  ? Chest pain 03/03/2018  ? Chronic back pain   ? "qwhere" (01/29/2018)  ? Chronic pain 10/18/2015  ? Corns of multiple toes 09/20/2018  ? Decrease in appetite 01/06/2020  ? Degenerative disc disease, cervical 10/28/2013  ? Degenerative disc disease, lumbar 10/28/2013  ? Depression   ? Esophageal dysphagia 12/31/2017  ? Essential hypertension 10/18/2015  ? Exposure to TB   ? "my mother died when she was 7 of TB"  ? Fatigue 01/02/2019  ? Forgetfulness 12/22/2019  ? GAD (generalized anxiety disorder) 10/18/2015  ? GERD (gastroesophageal reflux disease) 12/31/2017  ? History of gout   ? "I've had it occasionally" (11/23/2016)  ? Hypertension   ? Hypotension 07/24/2018  ? Insomnia 10/18/2015  ? Low back pain 10/28/2013  ? Migraine   ? "a few/year; more in the last few weeks" (01/29/2018)  ? Mixed hyperlipidemia 10/19/2017  ? Neck pain 10/28/2013  ? Osteoarthritis 10/18/2015  ? Osteoporosis 10/18/2015  ? Other chest pain 12/31/2017  ? Other fatigue 01/02/2019  ? Pain syndrome, chronic 10/28/2013  ? Raynaud's disease   ? feet  ? Shortness of breath 07/24/2018  ? Tick bite 11/21/2018  ? Formatting of this note might be different from the original. 2020: right flank  ? Unstable angina ([place]) 01/27/2018  ? ? ?  No family history on file. ?Family History  ?Problem Relation Age of Onset  ? Tuberculosis Mother   ? Heart attack Father   ? Hyperlipidemia Father   ? Heart disease Brother   ? ? ?Allergies:  ?Allergies  ?Allergen Reactions  ? Aspirin Nausea Only and Other (See Comments)  ?  Stomach pain / can take 81 mg EC ? ? ?  ? Lisinopril Cough  ? Topiramate   ?  Other reaction(s): Other (See Comments) ?depression  ? Alendronate Sodium Nausea And Vomiting  ? Celecoxib Nausea Only  ? Diphenhydramine Hcl Other (See Comments) and  Rash  ? Gabapentin Nausea Only and Other (See Comments)  ?  Stomach pain ? ?  ? Nsaids Nausea Only  ? Pravastatin Rash  ? Sumatriptan Rash  ? ? ?Social History:  ? reports that she has never smoked. She has never used smokeless tobacco. She reports that she does not drink alcohol and does not use drugs.   ? ?Medications ?(Not in a hospital admission) ? ? ?EXAMINATION   ? ?Current vital signs: ? ?  11/27/2021  ?  3:30 PM 11/27/2021  ?  2:45 PM 11/27/2021  ?  2:30 PM  ?Vitals with BMI  ?Systolic 378 588 502  ?Diastolic 774 128 94  ?Pulse 75 71 69  ? ?Examination:  ?GENERAL: Awake, alert in NAD ?HEENT: - Normocephalic and atraumatic, dry mm, no lymphadenopathy, no Thyromegally. + mild tenderness to palpation right temporal area, ?less robust pulse on right compared to the left.  ?LUNGS - Clear to auscultation bilaterally ?CV - S1S2 RRR, equal pulses bilaterally. ?ABDOMEN - Soft, nontender, nondistended with normoactive BS ?Ext: warm, well perfused, intact peripheral pulses, no pedal edema ? ?NEURO:  ?Mental Status: AA&Ox3  ?Language: speech is clear.  In tact naming, repetition, fluency, and comprehension. ?Cranial Nerves:  ?II: PERRL. Visual fields full to confrontation.  ?III, IV, VI: EOM in tact. Eyelids elevate symmetrically. ?V: Sensation intact V1-3 symmetrically  ?VII: no facial asymmetry   ?VIII: hearing intact to voice, although somewhat hard of hearing , require repetition intermittently  ?IX, X: Palate elevates symmetrically. Phonation is normal.  ?NO:MVEHMCNO shrug 5/5 and symmetrical  ?XII: tongue is midline without fasciculations. ?Motor:  ?5/5 throughout Ues and Les BL  ?Tone: is normal and bulk is normal ?DTRs: 2+ and symmetrical Ues, 1+ lower extremities, symmetrical   ?Sensation- Intact to light touch, pin prick, vibratory sensation, and temperature bilaterally ?Coordination: FTN intact bilaterally, no ataxia in BLE, no abnormal movements  ?Gait- steady without ataxia ? ?LABS  ? ?I have reviewed labs in  epic and the results pertinent to this consultation are: ? ?Lab Results  ?Component Value Date  ? [place] 87 03/23/2021  ? ?Lab Results  ?Component Value Date  ? ALT 20 11/27/2021  ? AST 30 11/27/2021  ? ALKPHOS 84 11/27/2021  ? BILITOT 0.8 11/27/2021  ? ?Lab Results  ?Component Value Date  ? HGBA1C 5.7 (H) 01/27/2018  ? ?Lab Results  ?Component Value Date  ? WBC 8.0 11/27/2021  ? HGB 13.9 11/27/2021  ? HCT 41.0 11/27/2021  ? MCV 91.3 11/27/2021  ? PLT 313 11/27/2021  ? ?No results found for: VITAMINB12 ?No results found for: FOLATE ?Lab Results  ?Component Value Date  ? NA 139 11/27/2021  ? K 4.3 11/27/2021  ? CL 100 11/27/2021  ? CO2 29 11/27/2021  ? ? ?DIAGNOSTIC IMAGING/PROCEDURES  ? ?I have reviewed the images obtained:, as below  ? ?CT-head n/a ? ?  CTA head and neck n/a ? ?CT perfusion n/a  ? ?MRI brain no acute abnormality, small vessel disease on flair imaging, mild.  ? ?ASSESSMENT/PLAN   ? ?Assessment: Brandy Cervantes is a pleasant 76 year old lady with a history of osteoarthritis, CAD s/p LAD stent on ASA 81 mg, cervical disc disease with chronic pain, lumbar disc disease, HTN, anxiety, insomnia, HLD, Raynaud's Phenomena, and gout presenting for transient monocular visual obscuration followed by right retro-orbital headache.  ?On examination, she is with mild R temporal tenderness, ?less robust pulse strength, and jaw/mandibular pain ipsilaterally, but no focal neurological deficits.  ?Presentation is not consistent with that of amaurosis fugax due to character of visual changes , with patchy "twinkling", scotoma-like nature rather than field cut/total vision loss as well as pain rather than painless quality.  ?Most concerning is a rule out of GCA due to disabling sequela untreated. Other plausible differentials include migraine headache versus non-specific headache.  ? 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 ?Recommendations: ?-CRP ?-ESR normal at 5  ?-temporal artery ultrasound ?-low  threshold to start steroids and obtain temporal artery biopsy  ?-treatment of BP in absence of acute infarction , normotension goal  ?-continuation pain medication for chronic cervical >lumbar radiculopa

## 2021-11-27 NOTE — ED Triage Notes (Signed)
Pt sent from [facility].  Reports she was bending over moving shoes for about 5 min yesterday and when she stood up she had "twinkling" in her R eye.  States she layed down for a while and continues to have what she thought was floaters but is seeing "gray shapes" with her R eye.  [facility] unable get in touch with opthalmology so sent pt to ED for further eval.   ?

## 2021-11-27 NOTE — ED Provider Notes (Signed)
?Brandy Cervantes [facility] EMERGENCY DEPARTMENT ?Provider Note ? ? ?CSN: [id number] ?Arrival date & time: 11/27/21  1132 ? ?  ? ?History ? ?Chief Complaint  ?Patient presents with  ? Loss of Vision  ? ? ?Brandy Cervantes is a 76 y.o. female. ? ?HPI ?Patient presents from a regional urgent care due to concern for vision changes.  Patient notes multiple medical issues, chronic pain, and daily headaches.  Yesterday she had an atypical headache with pain in the periorbital right region.  About the time she noticed visual disturbance, with a crystal-like appearance of her vision, then diminished ability to discern the right lateral visual field.  Visual disturbance seems to have returned to normal, headache has been persistent.  She has diffuse tingling in her arms, legs, since yesterday as well, but no new weakness in any extremity, nor any new pain.  She has been taking medication as directed.  With new vision changes as above she went to urgent care, and from there with concern for new neurodeficit she was sent here for evaluation. ?  ? ?Home Medications ?Prior to Admission medications   ?Medication Sig Start Date End Date Taking? Authorizing Provider  ?albuterol (VENTOLIN HFA) 108 (90 Base) MCG/ACT inhaler Inhale 2 puffs into the lungs every 6 (six) hours as needed. 04/26/21  Yes [provider]  ?Ascorbic Acid (VITAMIN C) 1000 MG tablet Take 1,000 mg by mouth daily at 12 noon.   Yes [provider]  ?aspirin EC 81 MG tablet Take 1 tablet (81 mg total) by mouth daily. 01/13/19  Yes Baldo Daub, MD  ?b complex vitamins capsule Take 1 capsule by mouth daily at 12 noon. Super   Yes [provider]  ?Cholecalciferol (VITAMIN D3) 1000 units CAPS Take 1,000 Units by mouth daily.   Yes [provider]  ?CYMBALTA 60 MG capsule Take 60 mg by mouth daily. 08/31/21  Yes [provider]  ?Docusate Sodium (DSS) 100 MG CAPS Take by mouth as needed.   Yes [provider]   ?gabapentin (NEURONTIN) 100 MG capsule Take 100 mg by mouth daily. 08/25/21  Yes [provider]  ?HYDROcodone-acetaminophen (NORCO) 10-325 MG tablet Take 1 tablet by mouth every 6 (six) hours as needed for moderate pain.   Yes [provider]  ?hydrOXYzine (ATARAX/VISTARIL) 25 MG tablet Take 25 mg by mouth See admin instructions. Take 1 in the morning, 1 around lunch time, and 2 at night   Yes [provider]  ?morphine (MS CONTIN) 30 MG 12 hr tablet Take 30 mg by mouth every 12 (twelve) hours.   Yes [provider]  ?Multiple Vitamins-Minerals (PRESERVISION AREDS 2) CAPS Take 1 tablet by mouth daily at 12 noon.   Yes [provider]  ?Multiple Vitamins-Minerals (SENIOR MULTIVITAMIN PLUS) TABS Take 1 tablet by mouth daily at 12 noon. With Iron   Yes [provider]  ?omeprazole (PRILOSEC) 40 MG capsule Take 40 mg by mouth daily. 09/20/21  Yes [provider]  ?OVER THE COUNTER MEDICATION Take 4-5 tablets by mouth at bedtime as needed (for sleep). Calms Forte   Yes [provider]  ?oxymetazoline (AFRIN) 0.05 % nasal spray Place 1 spray into both nostrils at bedtime.   Yes [provider]  ?rosuvastatin (CRESTOR) 20 MG tablet TAKE 1 TABLET BY MOUTH 2 TIMES A WEEK ?Patient taking differently: Take 20 mg by mouth 2 (two) times a week. Take one tablet on Tuesdays and Saturdays 07/12/21  Yes Norman Herrlich  J, MD  ?isosorbide mononitrate (IMDUR) 60 MG 24 hr tablet TAKE 1 TABLET BY MOUTH ONCE DAILY ?Patient taking differently: Take 60 mg by mouth daily. 03/24/21   Baldo Daub, MD  ?nitroGLYCERIN (NITROSTAT) 0.4 MG SL tablet Place 1 tablet (0.4 mg total) under the tongue every 5 (five) minutes as needed for chest pain. 09/20/21 12/19/21  Baldo Daub, MD  ?   ? ?Allergies    ?Aspirin, Lisinopril, Topiramate, Alendronate sodium, Celecoxib, Diphenhydramine hcl, Gabapentin, Nsaids, Pravastatin, and Sumatriptan   ? ?Review of Systems   ?Review of  Systems  ?All other systems reviewed and are negative. ? ?Physical Exam ?Updated Vital Signs ?BP (!) 160/101   Pulse 72   Temp 98.2 ?F (36.8 ?C) (Oral)   Resp (!) 21   SpO2 95%  ?Physical Exam ?Vitals and nursing note reviewed.  ?Constitutional:   ?   General: She is not in acute distress. ?   Appearance: She is well-developed.  ?HENT:  ?   Head: Normocephalic and atraumatic.  ?Eyes:  ?   Conjunctiva/sclera: Conjunctivae normal.  ?Cardiovascular:  ?   Rate and Rhythm: Normal rate and regular rhythm.  ?Pulmonary:  ?   Effort: Pulmonary effort is normal. No respiratory distress.  ?   Breath sounds: Normal breath sounds. No stridor.  ?Abdominal:  ?   General: There is no distension.  ?Skin: ?   General: Skin is warm and dry.  ?Neurological:  ?   Mental Status: She is alert and oriented to person, place, and time.  ?   Cranial Nerves: No cranial nerve deficit.  ?   Motor: No weakness, tremor, atrophy or abnormal muscle tone.  ?   Comments: E0 MI, peripheral vision intact in all quadrants, both eyes  ?Psychiatric:     ?   Mood and Affect: Mood normal.  ? ? ?ED Results / Procedures / Treatments   ?Labs ?(all labs ordered are listed, but only abnormal results are displayed) ?Labs Reviewed  ?COMPREHENSIVE METABOLIC PANEL - Abnormal; Notable for the following components:  ?    Result Value  ? Total Protein 6.2 (*)   ? Albumin 3.4 (*)   ? All other components within normal limits  ?RAPID URINE DRUG SCREEN, HOSP PERFORMED - Abnormal; Notable for the following components:  ? Opiates POSITIVE (*)   ? All other components within normal limits  ?URINALYSIS, ROUTINE W REFLEX MICROSCOPIC - Abnormal; Notable for the following components:  ? Color, Urine STRAW (*)   ? Specific Gravity, Urine 1.003 (*)   ? Leukocytes,Ua TRACE (*)   ? All other components within normal limits  ?RESP PANEL BY RT-PCR (FLU A&B, COVID) ARPGX2  ?PROTIME-INR  ?APTT  ?CBC  ?DIFFERENTIAL  ?SEDIMENTATION RATE  ?I-STAT CHEM 8, ED  ? ? ?EKG ?EKG  Interpretation ? ?Date/Time:  Sunday Nov 27 2021 12:13:53 EDT ?Ventricular Rate:  75 ?PR Interval:  126 ?QRS Duration: 88 ?QT Interval:  424 ?QTC Calculation: 474 ?R Axis:   38 ?Text Interpretation: Sinus rhythm Multiform ventricular premature complexes Abnormal inferior Q waves Artifact Abnormal ECG Confirmed by Gerhard Munch [phone]) on 11/27/2021 1:31:38 PM ? ?Radiology ?No results found. ? ?Procedures ?Procedures  ? ? ?Medications Ordered in ED ?Medications  ?sodium chloride 0.9 % bolus 500 mL (500 mLs Intravenous New Bag/Given 11/27/21 1235)  ?  Followed by  ?0.9 %  sodium chloride infusion (has no administration in time range)  ? ? ?ED Course/ Medical Decision Making/ A&P ?This patient with  a Hx of multiple medical issues including hypertension, anxiety disorder, chronic pain presents to the ED for concern of visual disturbance, headache, this involves an extensive number of treatment options, and is a complaint that carries with it a high risk of complications and morbidity.   ? ?The differential diagnosis includes neurosis fugax, TIA, atypical migraine, with return of normal vision less likely retinal detachment though this remains on the differential ? ? ?Social Determinants of Health: ? ?Age, chronic pain ? ?Additional history obtained: ? ?Additional history and/or information obtained from husband at bedside, notable for HPI as above ? ? ?After the initial evaluation, orders, including: MRI, labs were initiated. ? ? ?Patient placed on Cardiac and Pulse-Oximetry Monitors. ?The patient was maintained on a cardiac monitor.  The cardiac monitored showed an rhythm of 70 sinus normal ?The patient was also maintained on pulse oximetry. The readings were typically 100% room air normal ? ? ?On repeat evaluation of the patient stayed the same ? ? ?Visual acuity: ? ?Bilateral Distance: 20/20  ?R Distance: 20/25  ?L Distance: 20/32  ? ?Lab Tests: ? ?I personally interpreted labs.  The pertinent results include: Opiate  positive consistent with chronic pain syndrome.  Sed rate pending on admission ? ?Imaging Studies ordered: ? ? MRI pending on admission ? ? ?Consultations Obtained: ? ?I requested consultation with the neurology,  and discusse

## 2021-11-27 NOTE — ED Notes (Signed)
Pt passed swallow screening. Pt is requesting morphine and norco. Admitting MD paged regarding pain medication and diet order.  ?

## 2021-11-27 NOTE — Hospital Course (Addendum)
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 Hospital course as follows. ? ?Ocular headache  ?Patient with history of myopia presented with acute onset painless blinking lights out of right eye followed by multiple gray spots of blurred vision to the right visual field from right eye. Denies black spots in vision or pain but endorses floaters, stable from baseline. MRI head negative for any acute intracranial abnormality and notable for minimal chronic small vessel ischemic disease. CBC and CMP unremarkable. ESR and CRP within normal limits. Patient was evaluated by Neurology and Ophthalmology. Ophthalmologic testing was largely unremarkable. There was initially concern for giant cell arteritis given patient's age and right temple tenderness to palpation so patient was started on high dose steroids but these were discontinued the following day with negative inflammatory markers. CRAO, CRVO, BRAO, BRVO, and retinal detachment ruled out with fundoscopic exam. She will require outpatient follow up with Ophthalmology 5/17 at 7:15 am.  ? ?Hypertension ?Patient with history of hypertension, reportedly well controlled without medications, presented with BP 197/100 with accompanying headache and visual disturbance. Per chart review, SBP has been in 160s at most recent outpatient office visits so suspect a degree of chronically elevated blood pressures. She will require close outpatient monitoring ? ?Coronary artery disease s/p drug eluting stent in left anterior descending artery  ?Hyperlipidemia ?Lipid panel during admission with total cholesterol 233 and LDL 124. LDL above goal and patient only taking rosuvastatin twice weekly despite tolerating it well  without any adverse side effects. She will need to be on high dose statin therapy daily for better lipid management. No changes were made to home medication regimen but she will need closer outpatient monitoring.  ?

## 2021-11-28 ENCOUNTER — Observation Stay (HOSPITAL_COMMUNITY): Payer: Medicare Other

## 2021-11-28 DIAGNOSIS — E8809 Other disorders of plasma-protein metabolism, not elsewhere classified: Secondary | ICD-10-CM | POA: Diagnosis present

## 2021-11-28 DIAGNOSIS — Z8249 Family history of ischemic heart disease and other diseases of the circulatory system: Secondary | ICD-10-CM | POA: Diagnosis not present

## 2021-11-28 DIAGNOSIS — G8929 Other chronic pain: Secondary | ICD-10-CM | POA: Diagnosis present

## 2021-11-28 DIAGNOSIS — F419 Anxiety disorder, unspecified: Secondary | ICD-10-CM | POA: Diagnosis present

## 2021-11-28 DIAGNOSIS — H539 Unspecified visual disturbance: Secondary | ICD-10-CM

## 2021-11-28 DIAGNOSIS — Z955 Presence of coronary angioplasty implant and graft: Secondary | ICD-10-CM | POA: Diagnosis not present

## 2021-11-28 DIAGNOSIS — Z83438 Family history of other disorder of lipoprotein metabolism and other lipidemia: Secondary | ICD-10-CM | POA: Diagnosis not present

## 2021-11-28 DIAGNOSIS — R1314 Dysphagia, pharyngoesophageal phase: Secondary | ICD-10-CM | POA: Diagnosis present

## 2021-11-28 DIAGNOSIS — E782 Mixed hyperlipidemia: Secondary | ICD-10-CM | POA: Diagnosis present

## 2021-11-28 DIAGNOSIS — M810 Age-related osteoporosis without current pathological fracture: Secondary | ICD-10-CM | POA: Diagnosis present

## 2021-11-28 DIAGNOSIS — M109 Gout, unspecified: Secondary | ICD-10-CM | POA: Diagnosis present

## 2021-11-28 DIAGNOSIS — Z833 Family history of diabetes mellitus: Secondary | ICD-10-CM | POA: Diagnosis not present

## 2021-11-28 DIAGNOSIS — Z886 Allergy status to analgesic agent status: Secondary | ICD-10-CM | POA: Diagnosis not present

## 2021-11-28 DIAGNOSIS — K219 Gastro-esophageal reflux disease without esophagitis: Secondary | ICD-10-CM | POA: Diagnosis present

## 2021-11-28 DIAGNOSIS — M316 Other giant cell arteritis: Secondary | ICD-10-CM

## 2021-11-28 DIAGNOSIS — M5416 Radiculopathy, lumbar region: Secondary | ICD-10-CM | POA: Diagnosis present

## 2021-11-28 DIAGNOSIS — R519 Headache, unspecified: Secondary | ICD-10-CM | POA: Diagnosis not present

## 2021-11-28 DIAGNOSIS — Z888 Allergy status to other drugs, medicaments and biological substances status: Secondary | ICD-10-CM | POA: Diagnosis not present

## 2021-11-28 DIAGNOSIS — Z831 Family history of other infectious and parasitic diseases: Secondary | ICD-10-CM | POA: Diagnosis not present

## 2021-11-28 DIAGNOSIS — G43109 Migraine with aura, not intractable, without status migrainosus: Secondary | ICD-10-CM | POA: Diagnosis present

## 2021-11-28 DIAGNOSIS — M5030 Other cervical disc degeneration, unspecified cervical region: Secondary | ICD-10-CM | POA: Diagnosis present

## 2021-11-28 DIAGNOSIS — I2510 Atherosclerotic heart disease of native coronary artery without angina pectoris: Secondary | ICD-10-CM | POA: Diagnosis present

## 2021-11-28 DIAGNOSIS — I7300 Raynaud's syndrome without gangrene: Secondary | ICD-10-CM | POA: Diagnosis present

## 2021-11-28 DIAGNOSIS — F32A Depression, unspecified: Secondary | ICD-10-CM | POA: Diagnosis present

## 2021-11-28 DIAGNOSIS — H3411 Central retinal artery occlusion, right eye: Secondary | ICD-10-CM | POA: Diagnosis present

## 2021-11-28 DIAGNOSIS — Z20822 Contact with and (suspected) exposure to covid-19: Secondary | ICD-10-CM | POA: Diagnosis present

## 2021-11-28 DIAGNOSIS — I10 Essential (primary) hypertension: Secondary | ICD-10-CM | POA: Diagnosis present

## 2021-11-28 HISTORY — DX: Unspecified visual disturbance: H53.9

## 2021-11-28 LAB — MAGNESIUM: Magnesium: 2.3 mg/dL (ref 1.7–2.4)

## 2021-11-28 NOTE — Consult Note (Signed)
~~OPHTHALMOLOGY CONSULT NOTE~~ ? ?Subjective: ?Patient is a 76 year old female who presents with a central gray scotoma in the right eye. On Saturday morning patient states that she had bent down and when she straightened upright she noted lots of clear, crystal-like visual disturbance in the right eye. After 10 minutes patient went to sleep and when she awoke 5 hours later the crystal-like structures disappeared. However, when she was watching tv later that day she noted that when she closed her left eye she had a grey spot a little left of central in her right visual field. Reports flashes of light. Denies any previous surgeries. Denies diabetes. ? ?Objective: ?Vital signs in last 24 hours: ?Temp:  [97.7 ?F (36.5 ?C)-98.1 ?F (36.7 ?C)] 98.1 ?F (36.7 ?C) (05/15 1254) ?Pulse Rate:  [72-95] 95 (05/15 1254) ?Resp:  [14-21] 20 (05/15 1254) ?BP: (115-171)/(55-101) 142/86 (05/15 1254) ?SpO2:  [90 %-98 %] 95 % (05/15 1254) ?Weight:  [70 kg] 70 kg (05/15 0102) ?Weight change:  ?Last BM Date : 11/25/21 ? ?Intake/Output from previous day: ?No intake/output data recorded. ?Intake/Output this shift: ?No intake/output data recorded. ? ?Base Eye Exam ? ?Visual Acuity (ETDRS) ? ? Right Left  ?Dist [place] near 20/30 (with central scotoma) 20/20  ?Dist ph [place]    ? ?Tonometry (Tonopen) ? ? Right Left  ?Pressure    ? Pupils ? ? APD  ?Right None  ?Left None  ? ?Visual Fields ? ? Right Left  ? Full   ? ?Extraocular Movement ? ? Right Left  ? Full Full  ? ?Neuro/Psych ? ?Oriented x3: Yes  ?Mood/Affect: Normal  ?  ?  ?  ? ?Slit Lamp and Fundus Exam ? ? ?External Exam ? ? Right Left  ?External Normal Normal  ? ?Slit Lamp Exam ? ? Right Left  ?Lids/Lashes Normal Normal  ?Conjunctiva/Sclera White and quiet White and quiet  ?Cornea Clear Clear  ?Anterior Chamber Deep and quiet Deep and quiet  ?Iris Grossly normal Grossly normal  ?Lens    ?    ? ?Fundus Exam ? ? Right Clear  ?Posterior Vitreous Large vitreous floater Clear  ?Disc Pink, sharp  margins Pink, sharp margins  ?C/D Ratio 0.15 0.15  ?Macula Flat Flat  ?Vessels Normal course and caliber Normal course and caliber  ?Periphery Attached Attached  ?  ? ?Recent Labs  ?  11/27/21 ?1200 11/27/21 ?1235  ?WBC 8.0  --   ?HGB 13.7 13.9  ?HCT 40.9 41.0  ?NA 138 139  ?K 4.3 4.3  ?CL 104 100  ?CO2 29  --   ?BUN 14 16  ?CREATININE 0.73 0.70  ? ? ?Studies/Results: ?MR BRAIN WO CONTRAST ? ?Result Date: 11/27/2021 ?CLINICAL DATA:  Transient ischemic attack (TIA).  Amaurosis fugax?Marland Kitchen. EXAM: MRI HEAD WITHOUT CONTRAST TECHNIQUE: Multiplanar, multiecho pulse sequences of the brain and surrounding structures were obtained without intravenous contrast. COMPARISON:  Head MRI 01/27/2020 FINDINGS: Brain: There is no evidence of an acute infarct, intracranial hemorrhage, mass, midline shift, or extra-axial fluid collection. T2 hyperintensities in the cerebral white matter bilaterally are similar to the prior MRI and are nonspecific but compatible with minimal chronic small vessel ischemic disease. There is mild cerebral and cerebellar atrophy. 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 Vascular: Major intracranial vascular flow voids are preserved. Skull and upper cervical spine: Unremarkable bone marrow signal. Sinuses/Orbits: Unremarkable orbits. Paranasal sinuses and mastoid air cells are clear. Other: None. IMPRESSION: 1. No acute intracranial abnormality. 2. Minimal chronic small vessel ischemic disease.  Electronically Signed   By: Sebastian Ache M.D.   On: 11/27/2021 14:43  ? ?TEMPORAL ARTERY ? ?Result Date: 11/28/2021 ? TEMPORAL ARTERY REPORT Patient Name:  Brandy Cervantes  Date of Exam:   11/28/2021 Medical Rec #: [id number]      Accession #:    [id number] Date of Birth: 03-09-1946      Patient Gender: F Patient Age:   76 years Exam Location:  [facility] Procedure:      VAS [place] TEMPORTAL ARTERY BILATERAL Referring Phys: Mercie Eon --------------------------------------------------------------------------------   Indications: Headache. High Risk Factors: Age > 50 yrs and female.  Comparison Study: No prior study Performing Technologist: Gertie Fey MHA, RDMS, RVT, RDCS  Examination Guidelines: Patient in reclined position. 2D, color and spectral doppler sampling in the temporal artery along the hairline and temple in the longitudinal plane. 2D images along the hairline and temple in the transverse plane. Exam is bilateral.  ++-------------------+------------------+ Right Halo POSITIVELeft Halo POSITIVE ++-------------------+------------------+ +---------------+----------+-----------+----------+-----------+                Width (cm)Lenght (cm)Width (cm)Lenght (cm) +---------------+----------+-----------+----------+-----------+ Proximal       0.03                 0.03                  +---------------+----------+-----------+----------+-----------+ Mid            0.04                 0.03                  +---------------+----------+-----------+----------+-----------+ Distal         0.03                 0.03                  +---------------+----------+-----------+----------+-----------+ Parietal branch0.03                 0.03                  +---------------+----------+-----------+----------+-----------+ Frontal branch 0.05                 0.05                  +---------------+----------+-----------+----------+-----------+ Summary: Duplex evaluation is indeterminate for presence of "halo" sign; therefore cannot exclude temporal arteritis.  *See table(s) above for measurements and observations.  Electronically signed by Waverly Ferrari MD on 11/28/2021 at 4:24:02 PM.   Final    ? ?Medications: I have reviewed the patient's current medications. ? ?Assessment/Plan: ? ?Ocular migraine OD ?-Patient describes the flashes of light as a twinking, white light with pointy ends like a star in central vision not peripheral. Reports the dark gray spots changes in size. No retinal  detachment or tears on exam. No white dots on fundus exam. Will have patient return to our clinic tomorrow morning for slit lamp and OCT macula to rule out macular edema/ischemia, which was not seen on exam. Description of visual disturbances does not fit into anything physiologically. Ok to discharge from ophthalmology standpoint. Low suspicion for GCA - no optic disc edema, visual acuity great, no diplopia. No CRAO/CRVO/BRAO/BRVO. ? ?On discharge, please write for instructions: ?Follow up with Dr. Sharyn Dross at [facility] at 7:15 am 11/29/21. ?Address: [address] [place], [place] [address] ?Phone: [phone]. ?Fax: [phone] ? ?If not discharged today, can  be seen Wednesday 11/30/21 at 7:15. ? ? LOS: 0 days  ? ?Halaina Vanduzer T Sheikh Leverich ?11/28/2021 ? ? ?

## 2021-11-28 NOTE — Progress Notes (Signed)
?  Transition of Care (TOC) Screening Note ? ? ?Patient Details  ?Name: Brandy Cervantes ?Date of Birth: Jun 02, 1946 ? ? ?Transition of Care (TOC) CM/SW Contact:    ?Cyndi Bender, RN ?Phone Number: ?11/28/2021, 7:23 AM ? ? ? ?Transition of Care Department [facility]) has reviewed patient and no TOC needs have been identified at this time. We will continue to monitor patient advancement through interdisciplinary progression rounds. If new patient transition needs arise, please place a TOC consult. ? ? ?

## 2021-11-28 NOTE — Progress Notes (Addendum)
?Neurology Progress Note   ? ?Reason for Consult:visual changes ? ?CC: "twinkly vision" and "head pain" behind right eye x 1 day  ? ?HISTORY OF PRESENT ILLNESS   ?HPI Brandy Cervantes is a pleasant 76 year old lady with a history of osteoarthritis, CAD s/p LAD stent on ASA 81 mg, cervical disc disease with chronic pain, lumbar disc disease, HTN, anxiety, insomnia, HLD, Raynaud's Phenomenon, and gout presenting 5/14 for transient monocular visual obscuration followed by right retro-orbital headache for which neurology was consulted.  ?---------- ?Overnight: ?-transient visual obscuration has returned, the patient stating there is a gray spot in right inferior temporal field ?-still with tenderness to right temporal artery palpation + right retro-orbital head pain  ?-also with baseline bifrontal head pain, unchanged  ?-patient appears to be limited historian ?-no new problems or complaints  ? ?Premorbid modified Rankin scale (mRS):  ?0 ? ?Current mRS: 1  ? ?ROS: Full ROS was performed and is negative except as noted in the HPI.  ? ?PAST MEDICAL HISTORY   ? ?Past Medical History:  ?Past Medical History:  ?Diagnosis Date  ? Abnormal EKG 01/24/2018  ? Anxiety   ? Anxiety and depression 01/08/2020  ? Arthritis   ? "all over" (02/01/2018)  ? 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 Bilateral knee pain 10/28/2013  ? Bilateral primary osteoarthritis of knee 11/17/2016  ? Bronchiectasis without complication (HCC) 07/30/2019  ? Formatting of this note might be different from the original. Chest CT 06/20/19  ? CAD in native artery 02/04/2018  ? Cellulitis of toe, right 09/20/2018  ? Chest pain 03/03/2018  ? Chronic back pain   ? "qwhere" (01/29/2018)  ? Chronic pain 10/18/2015  ? Corns of multiple toes 09/20/2018  ? Decrease in appetite 01/06/2020  ? Degenerative disc disease, cervical 10/28/2013  ? Degenerative disc disease, lumbar 10/28/2013  ? Depression   ? Esophageal dysphagia 12/31/2017  ? Essential hypertension 10/18/2015  ? Exposure to TB    ? "my mother died when she was 27 of TB"  ? Fatigue 01/02/2019  ? Forgetfulness 12/22/2019  ? GAD (generalized anxiety disorder) 10/18/2015  ? GERD (gastroesophageal reflux disease) 12/31/2017  ? History of gout   ? "I've had it occasionally" (11/23/2016)  ? Hypertension   ? Hypotension 07/24/2018  ? Insomnia 10/18/2015  ? Low back pain 10/28/2013  ? Migraine   ? "a few/year; more in the last few weeks" (01/29/2018)  ? Mixed hyperlipidemia 10/19/2017  ? Neck pain 10/28/2013  ? Osteoarthritis 10/18/2015  ? Osteoporosis 10/18/2015  ? Other chest pain 12/31/2017  ? Other fatigue 01/02/2019  ? Pain syndrome, chronic 10/28/2013  ? Raynaud's disease   ? feet  ? Shortness of breath 07/24/2018  ? Tick bite 11/21/2018  ? Formatting of this note might be different from the original. 2020: right flank  ? Unstable angina (HCC) 01/27/2018  ? ? ?No family history on file. ?Family History  ?Problem Relation Age of Onset  ? Tuberculosis Mother   ? Heart attack Father   ? Hyperlipidemia Father   ? Heart disease Brother   ? ? ?Allergies:  ?Allergies  ?Allergen Reactions  ? Aspirin Nausea Only and Other (See Comments)  ?  Stomach pain / can take 81 mg EC ? ? ?  ? Lisinopril Cough  ? Topiramate   ?  Other reaction(s): Other (See Comments) ?depression  ? Alendronate Sodium Nausea And Vomiting  ? Celecoxib Nausea Only  ? Diphenhydramine Hcl Other (See Comments) and Rash  ?  Gabapentin Nausea Only and Other (See Comments)  ?  Stomach pain ? ?  ? Nsaids Nausea Only  ? Pravastatin Rash  ? Sumatriptan Rash  ? ? ?Social History:  ? reports that she has never smoked. She has never used smokeless tobacco. She reports that she does not drink alcohol and does not use drugs.   ? ?Medications ?Medications Prior to Admission  ?Medication Sig Dispense Refill  ? albuterol (VENTOLIN HFA) 108 (90 Base) MCG/ACT inhaler Inhale 2 puffs into the lungs every 6 (six) hours as needed.    ? Ascorbic Acid (VITAMIN C) 1000 MG tablet Take 1,000 mg by mouth daily at 12 noon.    ? aspirin EC  81 MG tablet Take 1 tablet (81 mg total) by mouth daily. 90 tablet 1  ? b complex vitamins capsule Take 1 capsule by mouth daily at 12 noon. Super    ? Cholecalciferol (VITAMIN D3) 1000 units CAPS Take 1,000 Units by mouth daily.    ? CYMBALTA 60 MG capsule Take 60 mg by mouth daily.    ? Docusate Sodium (DSS) 100 MG CAPS Take by mouth as needed.    ? gabapentin (NEURONTIN) 100 MG capsule Take 100 mg by mouth daily.    ? HYDROcodone-acetaminophen (NORCO) 10-325 MG tablet Take 1 tablet by mouth every 6 (six) hours as needed for moderate pain.    ? hydrOXYzine (ATARAX/VISTARIL) 25 MG tablet Take 25 mg by mouth See admin instructions. Take 1 in the morning, 1 around lunch time, and 2 at night    ? morphine (MS CONTIN) 30 MG 12 hr tablet Take 30 mg by mouth every 12 (twelve) hours.    ? Multiple Vitamins-Minerals (PRESERVISION AREDS 2) CAPS Take 1 tablet by mouth daily at 12 noon.    ? Multiple Vitamins-Minerals (SENIOR MULTIVITAMIN PLUS) TABS Take 1 tablet by mouth daily at 12 noon. With Iron    ? omeprazole (PRILOSEC) 40 MG capsule Take 40 mg by mouth daily.    ? OVER THE COUNTER MEDICATION Take 4-5 tablets by mouth at bedtime as needed (for sleep). Calms Forte    ? oxymetazoline (AFRIN) 0.05 % nasal spray Place 1 spray into both nostrils at bedtime.    ? rosuvastatin (CRESTOR) 20 MG tablet TAKE 1 TABLET BY MOUTH 2 TIMES A WEEK (Patient taking differently: Take 20 mg by mouth 2 (two) times a week. Take one tablet on Tuesdays and Saturdays) 24 tablet 1  ? isosorbide mononitrate (IMDUR) 60 MG 24 hr tablet TAKE 1 TABLET BY MOUTH ONCE DAILY (Patient taking differently: Take 60 mg by mouth daily.) 90 tablet 3  ? nitroGLYCERIN (NITROSTAT) 0.4 MG SL tablet Place 1 tablet (0.4 mg total) under the tongue every 5 (five) minutes as needed for chest pain. 25 tablet 6  ? ? ? ?EXAMINATION   ? ?Current vital signs: ? ?  11/28/2021  ?  8:00 AM 11/28/2021  ?  3:12 AM 11/28/2021  ?  1:18 AM  ?Vitals with BMI  ?Height   5\' 3"   ?Systolic  115 125   ?Diastolic 99 55   ?Pulse 84 78   ? ?Examination:  ?GENERAL: Awake, alert in NAD ?HEENT: - Normocephalic and atraumatic, dry mm, no lymphadenopathy, no Thyromegally. + mild tenderness to palpation right temporal area, ?less robust pulse on right compared to the left.  ?LUNGS - Clear to auscultation bilaterally ?CV - S1S2 RRR, equal pulses bilaterally. ?ABDOMEN - Soft, nontender, nondistended with normoactive BS ?Ext: warm, well perfused,  intact peripheral pulses, no pedal edema ? ?NEURO:  ?Mental Status: AA&Ox3  ?Language: speech is clear.  In tact naming, repetition, fluency, and comprehension. ?Cranial Nerves:  ?II: PERRL. Visual fields full to confrontation despite mention of right inferior temporal "gray spot" approximately 9mm in size based on ability to read author's name tag  ?III, IV, VI: EOM in tact. Eyelids elevate symmetrically. ?V: Sensation intact V1-3 symmetrically  ?VII: no facial asymmetry   ?VIII: hearing intact to voice, although somewhat hard of hearing , require repetition intermittently  ?IX, X: Palate elevates symmetrically. Phonation is normal.  ?FX:JOITGPQD shrug 5/5 and symmetrical  ?XII: tongue is midline without fasciculations. ?Motor:  ?5/5 throughout Ues and Les BL  ?Tone: is normal and bulk is normal ?DTRs: 2+ and symmetrical Ues, 1+ lower extremities, symmetrical   ?Sensation- Intact to light touch, pin prick, vibratory sensation, and temperature bilaterally ?Coordination: FTN intact bilaterally, no ataxia in BLE, no abnormal movements  ?Gait- steady without ataxia ? ?LABS  ? ?I have reviewed labs in epic and the results pertinent to this consultation are: ? ?Lab Results  ?Component Value Date  ? LDLCALC 124 (H) 11/27/2021  ? ?Lab Results  ?Component Value Date  ? ALT 20 11/27/2021  ? AST 30 11/27/2021  ? ALKPHOS 84 11/27/2021  ? BILITOT 0.8 11/27/2021  ? ?Lab Results  ?Component Value Date  ? HGBA1C 5.6 11/27/2021  ? ?Lab Results  ?Component Value Date  ? WBC 8.0 11/27/2021   ? HGB 13.9 11/27/2021  ? HCT 41.0 11/27/2021  ? MCV 91.3 11/27/2021  ? PLT 313 11/27/2021  ? ?No results found for: VITAMINB12 ?No results found for: FOLATE ?Lab Results  ?Component Value Date  ? NA 139

## 2021-11-28 NOTE — Progress Notes (Signed)
Temporal artery duplex completed. ?Refer to "CV Proc" under chart review to view preliminary results. ? ?11/28/2021 3:11 PM ?Eula Fried., MHA, RVT, RDCS, RDMS   ?

## 2021-11-28 NOTE — Progress Notes (Signed)
?  Date: 11/28/2021 ? ?Patient name: Brandy Cervantes  ?Medical record number: [id number]  ?Date of birth: February 20, 1946  ? ?I have seen and evaluated Brandy Cervantes and discussed their care with the Residency Team. Briefly, Brandy Cervantes developed shiny twinklies in her right eye 2 days ago, which resolved after a nap.  She has h/o floaters, but this did not seem similar.  She then developed blurry vision, specifically in one spot in her vision, though this has waxed and waned.  Today, she has very specific loss of vision about to the right of midline vision in her left eye, resolves with binocular vision.  She has headaches, but these are mild and move around, today on the left lateral head, moving across the forehead.  She has a personal history of migraines and chronic pain.  She had normal inflammatory markers.  ? ?Vitals:  ? 11/28/21 0800 11/28/21 1254  ?BP: (!) 115/99 (!) 142/86  ?Pulse: 84 95  ?Resp: 14 20  ?Temp: 97.9 ?F (36.6 ?C) 98.1 ?F (36.7 ?C)  ?SpO2: 93% 95%  ? ?Gen: Awake, alert, no distress ?Eyes: Anicteric.  She has no peripheral vision deficits.  She has a small blind spot to the right of midline in the left eye, no further blinking lights.   ?HENT: No pain to palpation of temples today ?CV: RR, NR, no murmur ?Psych: Pleasant, no distress.  ? ?Assessment and Plan: ?I have seen and evaluated the patient as outlined above. I agree with the formulated Assessment and Plan as detailed in the residents' note, with the following changes:  ? ?1. Flashing lights, blind spot, painless ?- I think this is almost certainly intra- ocular pathology, possibly localized retinal detachment.   ?-  Given normal inflammatory markers, indeterminate temporal artery ultrasound, I think she would be low risk for GCA, given absence of other findings (headaches and shoulder pain are chronic, she is afebrile, WBC normal) ?- Would d/c steroids at this time ?- Hold off on temporal artery biopsy ?- Monitor for changes in vision  ?-  Ophthalmology consult for evaluation of retina.  ? ?Other issues per resident team note.  ? ?Inez Catalina, MD ?5/15/20235:21 PM  ?

## 2021-11-28 NOTE — Plan of Care (Signed)
  Problem: Education: Goal: Knowledge of General Education information will improve Description: Including pain rating scale, medication(s)/side effects and non-pharmacologic comfort measures Outcome: Progressing   Problem: Activity: Goal: Risk for activity intolerance will decrease Outcome: Progressing   Problem: Nutrition: Goal: Adequate nutrition will be maintained Outcome: Progressing   Problem: Coping: Goal: Level of anxiety will decrease Outcome: Progressing   

## 2021-11-28 NOTE — Progress Notes (Addendum)
? ?Subjective: ? ?No acute events overnight. Patient reports a mild bifrontal headache and right eye pain this morning which have since resolved. Her blurred vision is stable from yesterday and she describes multiple gray spots towards the right visual field out of her right eye. No more flashing lights. No other concerns.  ? ?Objective: ? ?Vital signs in last 24 hours: ?Vitals:  ? 11/28/21 0312 11/28/21 0400 11/28/21 0800 11/28/21 1254  ?BP: (!) 125/55  (!) 115/99 (!) 142/86  ?Pulse: 78  84 95  ?Resp: 20  14 20   ?Temp: 97.9 ?F (36.6 ?C)  97.9 ?F (36.6 ?C) 98.1 ?F (36.7 ?C)  ?TempSrc: Oral  Oral Oral  ?SpO2: 90% 94% 93% 95%  ?Weight:      ?Height:      ? ?Weight change:  ?No intake or output data in the 24 hours ending 11/28/21 1337 ? ?General: Well appearing and in no acute distress ?Neuro: A&O x3, normal affect. CN II-VII and IX-XII intact. Unable to hear from right ear, left sided hearing intact. Peripheral vision intact. Strength 5/5 in bilateral upper and lower extremities.  ?HEENT: Normocephalic, atraumatic, PERRLA, EOMI, normal sclerae without scleral icterus or injection, no hypopyon. No obvious increased IOP with mild pressure over closed eye. Moist mucous membranes. Clear oropharynx. Mild tenderness to palpation over right temple. ?Cardiovascular: RRR, no m/r/g. Normal S1 and S2 without S3 or S4. Pulses 2+ in bilateral UE and LE. No peripheral edema ?Pulmonary: CTAB, no wheezes, rhonchi or rales. Normal WOB, no clubbing  ?Abdominal: Abdomen soft and non-distended. Normoactive bowel sounds. No tenderness to palpation ?Skin: Warm and dry with no rashes, cuts, or bruises ?MSK: Normal ROM of all extremities.  ? ? ?Assessment/Plan: ? ?Principal Problem: ?  Visual disturbances ?Active Problems: ?  Vision disturbance ? ?This is a 75 year old female with a past medical history of chronic pain, DDD of cervical and lumbar spine, CAD s/p DES in LAD, HTN, HLD, GERD, esophageal dysphagia, anxiety / depression,  osteoporosis, and osteoarthritis who presented for evaluation of vision changes.  ? ?Painless monocular visual disturbance with concern for partial retinal detachment ?Patient with history of myopia presented with acute onset painless blinking lights out of right eye followed by multiple gray spots of blurred vision to the right visual field from right eye. Denies black spots in vision or pain but endorses floaters, stable from baseline. MRI head negative for any acute intracranial abnormality and notable for minimal chronic small vessel ischemic disease. CBC and CMP unremarkable. ESR and CRP within normal limits. Highest on the differential at this time is rhegmatogenous retinal detachment given flashing lights followed by multiple spots of blurred vision. Also possible is central retinal artery occlusion given uncontrolled hyperlipidemia and known atherosclerotic disease as well as resolution of the flashing lights. The differential also includes central retinal vein occlusion. Patient does have ongoing tenderness to palpation of her right temple region but low concern for giant cell arteritis given low ESR and CRP.  Other possibilities include complex migraine although patient reports that this feel different from her usual migraines, macular degeneration although symptoms were acute onset and no black spots in vision, and glaucoma although there was no evidence of increased intraocular pressure with slight pressure.  ?- Ophthalmology consulted, will need dilated fundoscopic exam  ? -Plan to see this evening ?- Neurology following  ?- Stop methylprednisolone due to low concern for GCA ?- Temporal artery ultrasound pending  ?- Will need outpatient follow up with Ophthalmology  ? ?  HTN ?Patient with history of hypertension, reportedly well controlled without medications, presented with BP 197/100 with accompanying headache and visual disturbance. Per chart review, SBP has been in 160s at most recent outpatient  office visits so suspect a degree of chronically elevated blood pressures. Today, blood pressure has been labile, ranging from 110s-140s.  ?- Continue to monitor ?- Pain control ?- PRN hydralazine for SBP >180 ?- Will need close outpatient monitoring  ? ?Chronic pain ?Degenerative disc disease  ?Osteoarthritis ?- Continue home morphine, duloxetine, and gabapentin ?- Continue home norco PRN ?  ?CAD s/p DES in LAD ?HLD ?- Continue home imdur and rosuvastatin ?- Continue home nitroglycerin PRN ?  ?GERD ?Esophageal dysphagia ?Patient passed swallow screen.  ?- Continue home omeprazole  ?  ?Anxiety  ?Depression ?- Continue home hydroxyzine  ?  ?Osteoporosis  ?- Continue home vitamin D ? ? ? LOS: 0 days  ? ?Brandy Cervantes, Medical Student ?11/28/2021, 1:37 PM  ? ?I have reviewed the note by Pollyann Savoy MS 4 and was present during the interview and physical exam.  I agree with the findings, assessment, and plan. ? ?Lacinda Axon, MD ?11/28/2021, 4:05 PM ?IM Resident, PGY-2 ?Pager: [phone] ?Isaiah 41:10 ? ?

## 2021-11-29 DIAGNOSIS — G43109 Migraine with aura, not intractable, without status migrainosus: Secondary | ICD-10-CM

## 2021-11-29 NOTE — Discharge Summary (Signed)
? ?Name: Brandy Cervantes ?MRN: [id number] ?DOB: 11/20/1945 76 y.o. ?PCP: Merry Lofty, NP-C ? ?Date of Admission: 11/27/2021 11:34 AM ?Date of Discharge:  11/29/2021 ?Attending Physician: Dr.  Cain Sieve ? ?Discharge Diagnosis: ?Principal Problem: ?  Ocular migraine ?Active Problems: ?  Essential hypertension ?  Mixed hyperlipidemia ?  Visual disturbances ?  ? ?Discharge Medications: ?Allergies as of 11/29/2021   ? ?   Reactions  ? Aspirin Nausea Only, Other (See Comments)  ? Stomach pain / can take 81 mg EC  ? Lisinopril Cough  ? Topiramate   ? Other reaction(s): Other (See Comments) ?depression  ? Alendronate Sodium Nausea And Vomiting  ? Celecoxib Nausea Only  ? Diphenhydramine Hcl Other (See Comments), Rash  ? Gabapentin Nausea Only, Other (See Comments)  ? Stomach pain  ? Nsaids Nausea Only  ? Pravastatin Rash  ? Sumatriptan Rash  ? ?  ? ?  ?Medication List  ?  ? ?TAKE these medications   ? ?albuterol 108 (90 Base) MCG/ACT inhaler ?Commonly known as: VENTOLIN HFA ?Inhale 2 puffs into the lungs every 6 (six) hours as needed. ?  ?aspirin EC 81 MG tablet ?Take 1 tablet (81 mg total) by mouth daily. ?  ?b complex vitamins capsule ?Take 1 capsule by mouth daily at 12 noon. Super ?  ?Cymbalta 60 MG capsule ?Generic drug: DULoxetine ?Take 60 mg by mouth daily. ?  ?DSS 100 MG Caps ?Take by mouth as needed. ?  ?gabapentin 100 MG capsule ?Commonly known as: NEURONTIN ?Take 100 mg by mouth daily. ?  ?HYDROcodone-acetaminophen 10-325 MG tablet ?Commonly known as: NORCO ?Take 1 tablet by mouth every 6 (six) hours as needed for moderate pain. ?  ?hydrOXYzine 25 MG tablet ?Commonly known as: ATARAX ?Take 25 mg by mouth See admin instructions. Take 1 in the morning, 1 around lunch time, and 2 at night ?  ?isosorbide mononitrate 60 MG 24 hr tablet ?Commonly known as: IMDUR ?TAKE 1 TABLET BY MOUTH ONCE DAILY ?  ?morphine 30 MG 12 hr tablet ?Commonly known as: MS CONTIN ?Take 30 mg by mouth every 12 (twelve) hours. ?  ?nitroGLYCERIN 0.4  MG SL tablet ?Commonly known as: NITROSTAT ?Place 1 tablet (0.4 mg total) under the tongue every 5 (five) minutes as needed for chest pain. ?  ?omeprazole 40 MG capsule ?Commonly known as: PRILOSEC ?Take 40 mg by mouth daily. ?  ?OVER THE COUNTER MEDICATION ?Take 4-5 tablets by mouth at bedtime as needed (for sleep). Calms Forte ?  ?oxymetazoline 0.05 % nasal spray ?Commonly known as: AFRIN ?Place 1 spray into both nostrils at bedtime. ?  ?rosuvastatin 20 MG tablet ?Commonly known as: CRESTOR ?TAKE 1 TABLET BY MOUTH 2 TIMES A WEEK ?What changed: See the new instructions. ?  ?Senior Multivitamin Plus Tabs ?Take 1 tablet by mouth daily at 12 noon. With Iron ?  ?PreserVision AREDS 2 Caps ?Take 1 tablet by mouth daily at 12 noon. ?  ?vitamin C 1000 MG tablet ?Take 1,000 mg by mouth daily at 12 noon. ?  ?Vitamin D3 25 MCG (1000 UT) Caps ?Take 1,000 Units by mouth daily. ?  ? ?  ? ? ?Disposition and follow-up:   ?Ms.Brandy Cervantes was discharged from [facility] in Stable condition.  At the hospital follow up visit please address: ? ?1.  Follow-up: ? a. Ocular Migraine: Visual symptoms were still present at discharge. Patient was scheduled to see ophthalmologist on May 17th. Reassess patient's symptoms and ensure adherence to recommendation  by ophthalmologist.  ?  ? b. Hypertension: Blood pressure was elevated to SBP up to the 160s during admission. Check BP and manage at your discretion.  ? ? C. Hyperlipidemia: Lipid panel showed LDL of 124. Patient will benefit from a daily statin (On Crestor 20 twice a week) if she is able to tolerate it.  ? ?2.  Labs / imaging needed at time of follow-up: None ? ?3.  Pending labs/ test needing follow-up: None ? ?Follow-up Appointments: ? Follow-up Information   ? ? Lisabeth Pick, MD. Go in 1 day(s).   ?Specialty: Ophthalmology ?Why: Follow up with Dr. Arnoldo Hooker at [facility] Address: [address] [place], [place] [address] ?Phone:  [phone]. ?Fax: [phone] ?  ?can be seen Wednesday 11/30/21 at 7:15. ?Contact information: ?[address] ?[place] [address] ?[phone] ? ? ?  ?  ? ? Merry Lofty, NP-C. Schedule an appointment as soon as possible for a visit in 1 week(s).   ?Specialty: Nurse Practitioner ?Contact information: ?[address] ?[place] [address] ?[phone] ? ? ?  ?  ? ? Richardo Priest, MD .   ?Specialties: Cardiology, Radiology ?Contact information: ?[address] ?[address] ?[place] [address] ?[phone] ? ? ?  ?  ? ?  ?  ? ?  ? ? ?Hospital Course by problem list: ?This is a 75 year old female with a past medical history of chronic pain, DDD of cervical and lumbar spine, CAD s/p DES in LAD, HTN, HLD, GERD, esophageal dysphagia, anxiety / depression, osteoporosis, and osteoarthritis who presented for evaluation of vision changes, thought to be secondary to ocular migraine. Hospital course as follows. ? ?Ocular headache  ?Patient with history of myopia presented with acute onset painless blinking lights out of right eye followed by multiple gray spots of blurred vision to the right visual field from right eye. Denies black spots in vision or pain but endorses floaters, stable from baseline. MRI head negative for any acute intracranial abnormality and notable for minimal chronic small vessel ischemic disease. CBC and CMP unremarkable. ESR and CRP within normal limits. Patient was evaluated by Neurology and Ophthalmology. Ophthalmologic testing was largely unremarkable. There was initially concern for giant cell arteritis given patient's age and right temple tenderness to palpation so patient was started on high dose steroids but these were discontinued the following day with negative inflammatory markers. CRAO, CRVO, BRAO, BRVO, and retinal detachment ruled out with fundoscopic exam. She will require outpatient follow up with Ophthalmology 5/17 at 7:15 am.  ? ?Hypertension ?Patient with history of  hypertension, reportedly well controlled without medications, presented with BP 197/100 with accompanying headache and visual disturbance. Per chart review, SBP has been in 160s at most recent outpatient office visits so suspect a degree of chronically elevated blood pressures. She will require close outpatient monitoring ? ?Coronary artery disease s/p drug eluting stent in left anterior descending artery  ?Hyperlipidemia ?Lipid panel during admission with total cholesterol 233 and LDL 124. LDL above goal and patient only taking rosuvastatin twice weekly despite tolerating it well without any adverse side effects. She will need to be on high dose statin therapy daily for better lipid management. No changes were made to home medication regimen but she will need closer outpatient monitoring.   ? ? ?Subjective:  ?Patient evaluated at the bedside lying comfortably in bed. States she is doing well. No acute complaints. The dots in the right eye are still present.  She requested to have her pain meds before discharge today.  ? ?Discharge Vitals:   ?BP (!) 162/82 (BP Location: Right Arm)   Pulse 75   Temp 98.1 ?F (36.7 ?C) (Oral)   Resp 16   Ht _0  (1.6 m)   Wt 70 kg   SpO2 94%   BMI 27.34 kg/m?  ?Discharge Exam:  ?General: Well appearing and in no acute distress ?Neuro: A&O x3, normal affect. CN II-VII and IX-XII intact. Unable to hear from right ear, left sided hearing intact. Peripheral vision intact. Strength 5/5 in bilateral upper and lower extremities.  ?HEENT: Normocephalic, atraumatic, PERRLA, EOMI, normal sclerae without scleral icterus or injection, no hypopyon. Moist mucous membranes. Clear oropharynx. Mild tenderness to palpation over right temple. ?Cardiovascular: RRR, no m/r/g. Normal S1 and S2 without S3 or S4. Pulses 2+ in bilateral UE and LE. No peripheral edema ?Pulmonary: CTAB, no wheezes, rhonchi or rales. Normal WOB, no clubbing  ?Abdominal: Abdomen soft and non-distended. Normoactive bowel  sounds. No tenderness to palpation ?Skin: Warm and dry with no rashes, cuts, or bruises ?MSK: Normal ROM of all extremities.  ? ?Pertinent Labs, Studies, and Procedures:  ? ?  Latest Ref Rng & Units 11/27/2021  ? 12:35

## 2021-11-29 NOTE — Discharge Instructions (Addendum)
Mrs. Caulfield, ? ?It was a pleasure taking care of you and I am glad you are feeling better. You were admitted for trouble with your vision and we found that it was due to an ocular migraine. The imaging we did of your brain and the blood tests we ran were all normal. We did however notice that your blood pressure has been a little high. This can be common in the hospital for various reasons but we would like to make sure your blood pressure is usually better controlled so we recommend following up with your primary care physician about this. We also found that your cholesterol numbers were slightly elevated so we would like you to follow up with your cardiologist to see if any changes need to be made to your Crestor.  ? ?You can resume all of your normal medications. We are not going to make any changes.  ? ?Please follow up;  ?- Ophthalmology (Dr. Lucianne Lei) 5/17 at 7:15 am. This appointment has been scheduled for you.  ?- Primary care physician in 1-2 weeks to monitor your blood pressure ?- Cardiology in 1-2 weeks to monitor your cholesterol ? ?Ophthalmology office:  ?Address: [address] [place], [place] [address] ?Phone: [phone]. ?Fax: [phone] ?

## 2021-11-30 NOTE — Progress Notes (Signed)
[facility] - Clinic Note  12/06/2021     CHIEF COMPLAINT Patient presents for Retina Evaluation   HISTORY OF PRESENT ILLNESS: Brandy Cervantes is a 76 y.o. female who presents to the clinic today for:   HPI     Retina Evaluation   In right eye.  This started 1 week ago.  Associated Symptoms Flashes, Floaters and Blind Spot.  Context:  near vision and reading.  I, the attending physician,  performed the HPI with the patient and updated documentation appropriately.        Comments   Patient was referred here by Dr. 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 Patient is complaining of a blind spot in her vision that started after an occular migraine. The spot has stayed the same in size.      Last edited by Rennis Chris, MD on 12/06/2021  9:23 AM.    Pt is here on the referral of Dr. Zenaida Niece for concern of PAMM, pt originally saw Dr. Zenaida Niece in the ER on May 15th, pt was in her closet bent over re-arranging things and when she sat up, she saw "sparkles" in her vision, she states it was like the 4th of July, only clear and jagged, she states she took a nap and when she woke up the sparkles were gone, pt states she had a stroke work up in the ER as well as a MRI and a sonogram, pt has a grey spot in her vision, which is totally opaque. 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 [place],  [place] [address]  HISTORICAL INFORMATION:   Selected notes from the MEDICAL RECORD NUMBER Referred by Dr. Zenaida Niece for concern of paracentral middle maculopathy OD LEE:  Ocular Hx- PMH-    CURRENT MEDICATIONS: No current outpatient medications on file. (Ophthalmic Drugs)   No current facility-administered medications for this visit. (Ophthalmic Drugs)   Current Outpatient Medications (Other)  Medication Sig   albuterol (VENTOLIN HFA) 108 (90 Base) MCG/ACT inhaler Inhale 2 puffs into the lungs every 6 (six)  hours as needed.   Ascorbic Acid (VITAMIN C) 1000 MG tablet Take 1,000 mg by mouth daily at 12 noon.   aspirin EC 81 MG tablet Take 1 tablet (81 mg total) by mouth daily.   b complex vitamins capsule Take 1 capsule by mouth daily at 12 noon. Super   Cholecalciferol (VITAMIN D3) 1000 units CAPS Take 1,000 Units by mouth daily.   CYMBALTA 60 MG capsule Take 60 mg by mouth daily.   Docusate Sodium (DSS) 100 MG CAPS Take by mouth as needed.   gabapentin (NEURONTIN) 100 MG capsule Take 100 mg by mouth daily.   HYDROcodone-acetaminophen (NORCO) 10-325 MG tablet Take 1 tablet by mouth every 6 (six) hours as needed for moderate pain.   hydrOXYzine (ATARAX/VISTARIL) 25 MG tablet Take 25 mg by mouth See admin instructions. Take 1 in the morning, 1 around lunch time, and 2 at night   isosorbide mononitrate (IMDUR) 60 MG 24 hr tablet TAKE 1 TABLET BY MOUTH ONCE DAILY (Patient taking differently: Take 60 mg by mouth daily.)   morphine (MS CONTIN) 30 MG 12 hr tablet Take 30 mg by mouth every 12 (twelve) hours.   Multiple Vitamins-Minerals (PRESERVISION AREDS 2) CAPS Take 1 tablet by mouth daily at 12 noon.   Multiple Vitamins-Minerals (SENIOR MULTIVITAMIN PLUS) TABS Take 1 tablet by mouth daily at 12 noon. With  Iron   nitroGLYCERIN (NITROSTAT) 0.4 MG SL tablet Place 1 tablet (0.4 mg total) under the tongue every 5 (five) minutes as needed for chest pain.   omeprazole (PRILOSEC) 40 MG capsule Take 40 mg by mouth daily.   OVER THE COUNTER MEDICATION Take 4-5 tablets by mouth at bedtime as needed (for sleep). Calms Forte   oxymetazoline (AFRIN) 0.05 % nasal spray Place 1 spray into both nostrils at bedtime.   rosuvastatin (CRESTOR) 20 MG tablet TAKE 1 TABLET BY MOUTH 2 TIMES A WEEK (Patient taking differently: Take 20 mg by mouth 2 (two) times a week. Take one tablet on Tuesdays and Saturdays)   No current facility-administered medications for this visit. (Other)   REVIEW OF SYSTEMS: ROS   Positive for:  Eyes Last edited by Julieanne Cotton, COT on 12/06/2021  8:14 AM.     ALLERGIES Allergies  Allergen Reactions   Aspirin Nausea Only and Other (See Comments)    Stomach pain / can take 81 mg EC      Lisinopril Cough   Topiramate     Other reaction(s): Other (See Comments) depression   Alendronate Sodium Nausea And Vomiting   Celecoxib Nausea Only   Diphenhydramine Hcl Other (See Comments) and Rash   Gabapentin Nausea Only and Other (See Comments)    Stomach pain     Nsaids Nausea Only   Pravastatin Rash   Sumatriptan Rash   PAST MEDICAL HISTORY Past Medical History:  Diagnosis Date   Abnormal EKG 01/24/2018   Anxiety    Anxiety and depression 01/08/2020   Arthritis    "all over" (02/01/2018)   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 Chest CT 06/20/19   CAD in native artery 02/04/2018   Cellulitis of toe, right 09/20/2018   Chest pain 03/03/2018   Chronic back pain    "qwhere" (01/29/2018)   Chronic pain 10/18/2015   Corns of multiple toes 09/20/2018   Decrease in appetite 01/06/2020   Degenerative disc disease, cervical 10/28/2013   Degenerative disc disease, lumbar 10/28/2013   Depression    Esophageal dysphagia 12/31/2017   Essential hypertension 10/18/2015   Exposure to TB    "my mother died when she was 32 of TB"   Fatigue 01/02/2019   Forgetfulness 12/22/2019   GAD (generalized anxiety disorder) 10/18/2015   GERD (gastroesophageal reflux disease) 12/31/2017   History of gout    "I've had it occasionally" (11/23/2016)   Hypertension    Hypotension 07/24/2018   Insomnia 10/18/2015   Low back pain 10/28/2013   Migraine    "a few/year; more in the last few weeks" (01/29/2018)   Mixed hyperlipidemia 10/19/2017   Neck pain 10/28/2013   Osteoarthritis 10/18/2015   Osteoporosis  10/18/2015   Other chest pain 12/31/2017   Other fatigue 01/02/2019   Pain syndrome, chronic 10/28/2013   Raynaud's disease    feet   Shortness of breath 07/24/2018   Tick bite 11/21/2018   Formatting of this note might be different from the original. 2020: right flank   Unstable angina (HCC) 01/27/2018   Past Surgical History:  Procedure Laterality Date   CARDIAC CATHETERIZATION     CORONARY ANGIOPLASTY     CORONARY STENT INTERVENTION N/A 01/29/2018   Procedure: CORONARY STENT INTERVENTION;  Surgeon: Lennette Bihari, MD;  Location:  [facility];  Service: Cardiovascular;  Laterality: N/A;   JOINT REPLACEMENT     LEFT HEART CATH AND CORONARY ANGIOGRAPHY N/A 01/29/2018   Procedure: LEFT HEART CATH AND CORONARY ANGIOGRAPHY;  Surgeon: Lennette Bihari, MD;  Location: [facility];  Service: Cardiovascular;  Laterality: N/A;   LEFT HEART CATH AND CORONARY ANGIOGRAPHY N/A 10/08/2020   Procedure: LEFT HEART CATH AND CORONARY ANGIOGRAPHY;  Surgeon: Marykay Lex, MD;  Location: [facility];  Service: Cardiovascular;  Laterality: N/A;   LEFT HEART CATH AND CORS/GRAFTS ANGIOGRAPHY N/A 03/04/2018   Procedure: LEFT HEART CATH AND CORS/GRAFTS ANGIOGRAPHY;  Surgeon: [name], MD;  Location: [facility];  Service: Cardiovascular;  Laterality: N/A;   MASTOIDECTOMY Right ~ 1955   TOTAL KNEE ARTHROPLASTY Bilateral 11/22/2016   Procedure: TOTAL KNEE BILATERAL;  Surgeon: Gean Birchwood, MD;  Location: [facility] OR;  Service: Orthopedics;  Laterality: Bilateral;    FAMILY HISTORY Family History  Problem Relation Age of Onset   Tuberculosis Mother    Heart attack Father    Hyperlipidemia Father    Heart disease Brother    SOCIAL HISTORY Social History   Tobacco Use   Smoking status: Never   Smokeless tobacco: Never  Vaping Use   Vaping Use: Never used  Substance Use Topics   Alcohol use: No   Drug use: No       OPHTHALMIC EXAM:  Base Eye Exam     Visual Acuity (Snellen -  Linear)       Right Left   Dist cc 20/30 +1 20/25 -2   Dist ph cc NI     Correction: Glasses         Tonometry (Tonopen, 8:20 AM)       Right Left   Pressure 16 16         Pupils       Pupils Dark Light Shape React APD   Right PERRL 4 3 Round Brisk None   Left PERRL 4 3 Round Brisk None         Visual Fields       Left Right    Full Full         Extraocular Movement       Right Left    Full Full         Neuro/Psych     Oriented x3: Yes         Dilation     Both eyes: 2.5% Phenylephrine, 1.0% Mydriacyl @ 8:16 AM           Slit Lamp and Fundus Exam     Slit Lamp Exam       Right Left   Lids/Lashes Dermatochalasis - upper lid, mild Telangiectasia Dermatochalasis - upper lid, mild Telangiectasia   Conjunctiva/Sclera White and quiet White and quiet   Cornea arcus, trace PEE arcus, trace PEE   Anterior Chamber Deep and quiet Deep and quiet   Iris Round and dilated Round and dilated   Lens 2+ Nuclear sclerosis, 2-3+ Cortical cataract 2+ Nuclear sclerosis, 2-3+ Cortical cataract   Anterior Vitreous mild syneresis, Posterior vitreous detachment mild syneresis, Posterior vitreous detachment         Fundus Exam       Right Left   Disc Pink and Sharp, mild temporal PPA Pink and Sharp, mild PPA   C/D Ratio 0.4 0.3   Macula Flat, Blunted foveal reflex, RPE mottling, No heme or edema Flat, Blunted  foveal reflex, mild RPE mottling, No heme or edema   Vessels attenuated, mild tortuosity, mild AV crossing changes attenuated, mild tortuosity, mild AV crossing changes   Periphery Attached Attached           Refraction     Wearing Rx       Sphere Cylinder Axis Add   Right -0.75 +2.75 159 +1.75   Left -1.25 +2.25 019 +1.75           IMAGING AND PROCEDURES  Imaging and Procedures for 12/06/2021  OCT, Retina - OU - Both Eyes       Right Eye Quality was good. Central Foveal Thickness: 262. Progression has no prior data. Findings  include normal foveal contour, no IRF, no SRF, intraretinal hyper-reflective material (Focal, mild IRHM nasal fovea -- PAMM).   Left Eye Quality was good. Central Foveal Thickness: 264. Progression has no prior data. Findings include normal foveal contour, no IRF, no SRF.   Notes *Images captured and stored on drive  Diagnosis / Impression:  NFP, no IRF/SRF OU OD: Focal, mild IRHM nasal fovea -- PAMM  Clinical management:  See below  Abbreviations: NFP - Normal foveal profile. CME - cystoid macular edema. PED - pigment epithelial detachment. IRF - intraretinal fluid. SRF - subretinal fluid. EZ - ellipsoid zone. ERM - epiretinal membrane. ORA - outer retinal atrophy. ORT - outer retinal tubulation. SRHM - subretinal hyper-reflective material. IRHM - intraretinal hyper-reflective material      Fluorescein Angiography Optos (Transit OD)       Right Eye Early phase findings include normal observations. Mid/Late phase findings include normal observations (No leakage, no obvious hypofluorescence).   Left Eye Early phase findings include normal observations. Mid/Late phase findings include normal observations.   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 Essential hypertension  I10     3. Hypertensive retinopathy of both eyes  H35.033 Fluorescein Angiography Optos (Transit OD)    4. Combined forms of age-related cataract of both eyes  H25.813      1. Paracentral acute middle maculopathy (PAMM)  - pt reports on Saturday, 5.13.23, was bent over and developed "sparkles in vision" that later progressed to focal paracentral scotoma  - presented to ED on 5.14.23 where stroke work up was negative  - was seen by Dr. Zenaida NieceVan in hospital and in office on 5.15.23 where concern for PAMM was noted on  OCT  - pt referred here for further retinal eval  - today, pt reports persistent scotoma temporal paracentral visual field, corresponding to hyperreflective spot in middle retinal layers of nasal fovea on OCT  - OCT shows focal, mild IRHM nasal fovea -- PAMM - FA (05.23.23) shows no leakage or obvious hypofluorescence OU - discussed findings, prognosis - no retinal or ophthalmic interventions indicated or recommended  - recommend optimization of cardiovascular risk factors -- pt reports she has cardiologist (Dr. Dulce [place]Munley in [place]), but hasn't followed up in a while - pt to set up f/u appt - will send notes to PCP and cardiology - f/u 6-8 weeks, DFE, OCT  2,3. Hypertensive retinopathy OU - discussed importance of tight BP control - monitor  4. PVD / vitreous syneresis - Discussed findings and prognosis - No RT or  RD on 360 scleral depressed exam - Reviewed s/s of RT/RD - Strict return precautions for any such RT/RD signs/symptoms - f/u in 3-4 wks -- DFE/OCT  5. Mixed Cataract OU - The symptoms of cataract, surgical options, and treatments and risks were discussed with patient. - discussed diagnosis and progression - not yet visually significant - monitor for now  Ophthalmic Meds Ordered this visit:  No orders of the defined types were placed in this encounter.    Return for f/u 6-8 weeks, PAMM OD, DFE, OCT.  There are no Patient Instructions on file for this visit.  Explained the diagnoses, plan, and follow up with the patient and they expressed understanding.  Patient expressed understanding of the importance of proper follow up care.   This document serves as a record of services personally performed by Karie Chimera, MD, PhD. It was created on their behalf by Rennis Chris, MD, an ophthalmic technician. The creation of this record is the provider's dictation and/or activities during the visit.    Electronically signed by: Rennis Chris, MD 11/30/21 9:49 AM  Karie Chimera, M.D., Ph.D. Diseases & Surgery of the Retina and Vitreous [facility]  I have reviewed the above documentation for accuracy and completeness, and I agree with the above. Karie Chimera, M.D., Ph.D. 12/06/21 12:27 PM  Abbreviations: M myopia (nearsighted); A astigmatism; H hyperopia (farsighted); P presbyopia; Mrx spectacle prescription;  CTL contact lenses; OD right eye; OS left eye; OU both eyes  XT exotropia; ET esotropia; PEK punctate epithelial keratitis; PEE punctate epithelial erosions; DES dry eye syndrome; MGD meibomian gland dysfunction; ATs artificial tears; PFAT's preservative free artificial tears; NSC nuclear sclerotic cataract; PSC posterior subcapsular cataract; ERM epi-retinal membrane; PVD posterior vitreous detachment; RD retinal detachment; DM diabetes mellitus; DR diabetic retinopathy; NPDR non-proliferative diabetic retinopathy; PDR proliferative diabetic retinopathy; CSME clinically significant macular edema; DME diabetic macular edema; dbh dot blot hemorrhages; CWS cotton wool spot; POAG primary open angle glaucoma; C/D cup-to-disc ratio; HVF humphrey visual field; GVF goldmann visual field; OCT optical coherence tomography; IOP intraocular pressure; BRVO Branch retinal vein occlusion; CRVO central retinal vein occlusion; CRAO central retinal artery occlusion; BRAO branch retinal artery occlusion; RT retinal tear; SB scleral buckle; PPV pars plana vitrectomy; VH Vitreous hemorrhage; PRP panretinal laser photocoagulation; IVK intravitreal kenalog; VMT vitreomacular traction; MH Macular hole;  NVD neovascularization of the disc; NVE neovascularization elsewhere; AREDS age related eye disease study; ARMD age related macular degeneration; POAG primary open angle glaucoma; EBMD epithelial/anterior basement membrane dystrophy; ACIOL anterior chamber intraocular lens; IOL intraocular lens; PCIOL posterior chamber intraocular lens; Phaco/IOL phacoemulsification  with intraocular lens placement; PRK photorefractive keratectomy; LASIK laser assisted in situ keratomileusis; HTN hypertension; DM diabetes mellitus; COPD chronic obstructive pulmonary disease

## 2021-12-06 ENCOUNTER — Ambulatory Visit (INDEPENDENT_AMBULATORY_CARE_PROVIDER_SITE_OTHER): Payer: Medicare Other | Admitting: Ophthalmology

## 2021-12-06 ENCOUNTER — Encounter (INDEPENDENT_AMBULATORY_CARE_PROVIDER_SITE_OTHER): Payer: Self-pay | Admitting: Ophthalmology

## 2021-12-06 VITALS — BP 175/108 | HR 68

## 2021-12-06 DIAGNOSIS — H3530 Unspecified macular degeneration: Secondary | ICD-10-CM | POA: Diagnosis not present

## 2021-12-06 DIAGNOSIS — H25813 Combined forms of age-related cataract, bilateral: Secondary | ICD-10-CM

## 2021-12-06 DIAGNOSIS — I10 Essential (primary) hypertension: Secondary | ICD-10-CM

## 2021-12-06 DIAGNOSIS — H35033 Hypertensive retinopathy, bilateral: Secondary | ICD-10-CM | POA: Diagnosis not present

## 2022-01-10 NOTE — Progress Notes (Signed)
[facility] - Clinic Note  01/18/2022     CHIEF COMPLAINT Patient presents for Retina Follow Up   HISTORY OF PRESENT ILLNESS: Brandy Cervantes is a 76 y.o. female who presents to the clinic today for:   HPI     Retina Follow Up   Patient presents with  Other.  In both eyes.  This started weeks ago.  Duration of 6 weeks.  Since onset it is stable.  I, the attending physician,  performed the HPI with the patient and updated documentation appropriately.        Comments   Patient denies noticing any vision changes at this time. She admits to seeing floaters but unsure of which eye.       Last edited by Rennis Chris, MD on 01/19/2022  1:19 AM.    Pt states no change in vision, pt had a sonogram of her carotid dopplers and everything was found to be clear  Referring physician: Delmer Islam, NP-C [address] [place],  [place] [address]  HISTORICAL INFORMATION:   Selected notes from the MEDICAL RECORD NUMBER Referred by Dr. Zenaida Niece for concern of paracentral middle maculopathy OD LEE:  Ocular Hx- PMH-    CURRENT MEDICATIONS: No current outpatient medications on file. (Ophthalmic Drugs)   No current facility-administered medications for this visit. (Ophthalmic Drugs)   Current Outpatient Medications (Other)  Medication Sig   albuterol (VENTOLIN HFA) 108 (90 Base) MCG/ACT inhaler Inhale 2 puffs into the lungs every 6 (six) hours as needed.   Ascorbic Acid (VITAMIN C) 1000 MG tablet Take 1,000 mg by mouth daily at 12 noon.   aspirin EC 81 MG tablet Take 1 tablet (81 mg total) by mouth daily.   b complex vitamins capsule Take 1 capsule by mouth daily at 12 noon. Super   Cholecalciferol (VITAMIN D3) 1000 units CAPS Take 1,000 Units by mouth daily.   CYMBALTA 60 MG capsule Take 60 mg by mouth daily.   Docusate Sodium (DSS) 100 MG CAPS Take by mouth as needed.   gabapentin (NEURONTIN) 100 MG capsule Take 100 mg by mouth daily.   HYDROcodone-acetaminophen (NORCO)  10-325 MG tablet Take 1 tablet by mouth every 6 (six) hours as needed for moderate pain.   hydrOXYzine (ATARAX/VISTARIL) 25 MG tablet Take 25 mg by mouth See admin instructions. Take 1 in the morning, 1 around lunch time, and 2 at night   isosorbide mononitrate (IMDUR) 60 MG 24 hr tablet TAKE 1 TABLET BY MOUTH ONCE DAILY (Patient taking differently: Take 60 mg by mouth daily.)   morphine (MS CONTIN) 30 MG 12 hr tablet Take 30 mg by mouth every 12 (twelve) hours.   Multiple Vitamins-Minerals (PRESERVISION AREDS 2) CAPS Take 1 tablet by mouth daily at 12 noon.   Multiple Vitamins-Minerals (SENIOR MULTIVITAMIN PLUS) TABS Take 1 tablet by mouth daily at 12 noon. With Iron   omeprazole (PRILOSEC) 40 MG capsule Take 40 mg by mouth daily.   OVER THE COUNTER MEDICATION Take 4-5 tablets by mouth at bedtime as needed (for sleep). Calms Forte   oxymetazoline (AFRIN) 0.05 % nasal spray Place 1 spray into both nostrils at bedtime.   rosuvastatin (CRESTOR) 20 MG tablet TAKE 1 TABLET BY MOUTH 2 TIMES A WEEK (Patient taking differently: Take 20 mg by mouth 2 (two) times a week. Take one tablet on Tuesdays and Saturdays)   nitroGLYCERIN (NITROSTAT) 0.4 MG SL tablet Place 1 tablet (0.4 mg total) under the tongue every 5 (five)  minutes as needed for chest pain.   No current facility-administered medications for this visit. (Other)   REVIEW OF SYSTEMS: ROS   Positive for: Eyes Last edited by Julieanne Cotton, COT on 01/18/2022  8:42 AM.     ALLERGIES Allergies  Allergen Reactions   Aspirin Nausea Only and Other (See Comments)    Stomach pain / can take 81 mg EC      Lisinopril Cough   Topiramate     Other reaction(s): Other (See Comments) depression   Alendronate Sodium Nausea And Vomiting   Celecoxib Nausea Only   Diphenhydramine Hcl Other (See Comments) and Rash   Gabapentin Nausea Only and Other (See Comments)    Stomach pain     Nsaids Nausea Only   Pravastatin Rash   Sumatriptan Rash    PAST MEDICAL HISTORY Past Medical History:  Diagnosis Date   Abnormal EKG 01/24/2018   Anxiety    Anxiety and depression 01/08/2020   Arthritis    "all over" (02/01/2018)   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 Chest CT 06/20/19   CAD in native artery 02/04/2018   Cellulitis of toe, right 09/20/2018   Chest pain 03/03/2018   Chronic back pain    "qwhere" (01/29/2018)   Chronic pain 10/18/2015   Corns of multiple toes 09/20/2018   Decrease in appetite 01/06/2020   Degenerative disc disease, cervical 10/28/2013   Degenerative disc disease, lumbar 10/28/2013   Depression    Esophageal dysphagia 12/31/2017   Essential hypertension 10/18/2015   Exposure to TB    "my mother died when she was 32 of TB"   Fatigue 01/02/2019   Forgetfulness 12/22/2019   GAD (generalized anxiety disorder) 10/18/2015   GERD (gastroesophageal reflux disease) 12/31/2017   History of gout    "I've had it occasionally" (11/23/2016)   Hypertension    Hypotension 07/24/2018   Insomnia 10/18/2015   Low back pain 10/28/2013   Migraine    "a few/year; more in the last few weeks" (01/29/2018)   Mixed hyperlipidemia 10/19/2017   Neck pain 10/28/2013   Osteoarthritis 10/18/2015   Osteoporosis 10/18/2015   Other chest pain 12/31/2017   Other fatigue 01/02/2019   Pain syndrome, chronic 10/28/2013   Raynaud's disease    feet   Shortness of breath 07/24/2018   Tick bite 11/21/2018   Formatting of this note might be different from the original. 2020: right flank   Unstable angina (HCC) 01/27/2018   Past Surgical History:  Procedure Laterality Date   CARDIAC CATHETERIZATION     CORONARY ANGIOPLASTY     CORONARY STENT INTERVENTION N/A 01/29/2018   Procedure: CORONARY STENT INTERVENTION;  Surgeon: Lennette Bihari, MD;  Location: [facility] INVASIVE CV  LAB;  Service: Cardiovascular;  Laterality: N/A;   JOINT REPLACEMENT     LEFT HEART CATH AND CORONARY ANGIOGRAPHY N/A 01/29/2018   Procedure: LEFT HEART CATH AND CORONARY ANGIOGRAPHY;  Surgeon: Lennette Bihari, MD;  Location: [facility] INVASIVE CV LAB;  Service: Cardiovascular;  Laterality: N/A;   LEFT HEART CATH AND CORONARY ANGIOGRAPHY N/A 10/08/2020   Procedure: LEFT HEART CATH AND CORONARY ANGIOGRAPHY;  Surgeon: Marykay Lex, MD;  Location: [facility] INVASIVE CV LAB;  Service: Cardiovascular;  Laterality: N/A;   LEFT HEART CATH AND CORS/GRAFTS ANGIOGRAPHY N/A 03/04/2018   Procedure: LEFT HEART  CATH AND CORS/GRAFTS ANGIOGRAPHY;  Surgeon: [name], MD;  Location: [facility] INVASIVE CV LAB;  Service: Cardiovascular;  Laterality: N/A;   MASTOIDECTOMY Right ~ 1955   TOTAL KNEE ARTHROPLASTY Bilateral 11/22/2016   Procedure: TOTAL KNEE BILATERAL;  Surgeon: Gean Birchwood, MD;  Location: [facility] OR;  Service: Orthopedics;  Laterality: Bilateral;    FAMILY HISTORY Family History  Problem Relation Age of Onset   Tuberculosis Mother    Heart attack Father    Hyperlipidemia Father    Heart disease Brother    SOCIAL HISTORY Social History   Tobacco Use   Smoking status: Never   Smokeless tobacco: Never  Vaping Use   Vaping Use: Never used  Substance Use Topics   Alcohol use: No   Drug use: No       OPHTHALMIC EXAM:  Base Eye Exam     Visual Acuity (Snellen - Linear)       Right Left   Dist cc 20/30 +2 20/25   Dist ph cc NI     Correction: Glasses         Tonometry (Tonopen, 8:47 AM)       Right Left   Pressure 15 14         Pupils       Pupils Dark Light Shape React APD   Right PERRL 4 3 Round Brisk None   Left PERRL 4 3 Round Brisk None         Visual Fields       Left Right    Full Full         Extraocular Movement       Right Left    Full, Ortho Full, Ortho         Neuro/Psych     Oriented x3: Yes         Dilation     Both eyes: 1.0% Mydriacyl, 2.5%  Phenylephrine @ 8:45 AM           Slit Lamp and Fundus Exam     Slit Lamp Exam       Right Left   Lids/Lashes Dermatochalasis - upper lid, mild Telangiectasia Dermatochalasis - upper lid, mild Telangiectasia   Conjunctiva/Sclera White and quiet White and quiet   Cornea arcus, trace PEE arcus, trace PEE   Anterior Chamber Deep and quiet Deep and quiet   Iris Round and dilated Round and dilated   Lens 2+ Nuclear sclerosis, 2-3+ Cortical cataract 2+ Nuclear sclerosis, 2-3+ Cortical cataract   Anterior Vitreous mild syneresis, Posterior vitreous detachment mild syneresis, Posterior vitreous detachment         Fundus Exam       Right Left   Disc Pink and Sharp, mild temporal PPA Pink and Sharp, mild PPA   C/D Ratio 0.4 0.3   Macula Flat, Blunted foveal reflex, RPE mottling, No heme or edema Flat, Blunted foveal reflex, mild RPE mottling, No heme or edema   Vessels attenuated, mild tortuosity, mild AV crossing changes attenuated, mild tortuosity, mild copper wiring, mild AV crossing changes   Periphery Attached Attached           Refraction     Wearing Rx       Sphere Cylinder Axis Add   Right -0.75 +2.75 159 +1.75   Left -1.25 +2.25 019 +1.75           IMAGING AND PROCEDURES  Imaging and Procedures for 01/18/2022  OCT, Retina - OU - Both Eyes  Right Eye Quality was good. Central Foveal Thickness: 260. Progression has improved. Findings include normal foveal contour, no IRF, no SRF, intraretinal hyper-reflective material (Interval improvement of focal, mild IRHM nasal fovea -- PAMM ).   Left Eye Quality was good. Central Foveal Thickness: 259. Progression has been stable. Findings include normal foveal contour, no IRF, no SRF.   Notes *Images captured and stored on drive  Diagnosis / Impression:  NFP, no IRF/SRF OU OD: interval improvement of focal, mild IRHM nasal fovea -- PAMM  Clinical management:  See below  Abbreviations: NFP - Normal foveal  profile. CME - cystoid macular edema. PED - pigment epithelial detachment. IRF - intraretinal fluid. SRF - subretinal fluid. EZ - ellipsoid zone. ERM - epiretinal membrane. ORA - outer retinal atrophy. ORT - outer retinal tubulation. SRHM - subretinal hyper-reflective material. 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 Essential hypertension  I10     3. Hypertensive retinopathy of both eyes  H35.033     4. 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 Paracentral acute middle maculopathy (PAMM)  - pt reports on Saturday, 5.13.23, was bent over and developed "sparkles in vision" that later progressed to focal paracentral scotoma  - presented to ED on 5.14.23 where stroke work up was negative  - was seen by Dr. Zenaida NieceVan in hospital and in office on 5.15.23 where concern for PAMM was noted on OCT  - pt referred here for retinal eval  - today, OCT shows interval improvement in focal, mild IRHM nasal fovea / PAMM - FA (05.23.23) shows no leakage or obvious hypofluorescence OU - discussed findings, prognosis - no retinal or ophthalmic interventions indicated or recommended  - recommend optimization of cardiovascular risk factors -- pt reports she has cardiologist (Dr. Dulce [place]unley in [place]), but hasn't followed up in a while - pt had sonogram of carotid dopplers last week - everything was clear per pt - will send notes to PCP and cardiology - f/u 3 months, DFE, OCT  2,3. Hypertensive retinopathy OU - discussed importance of tight BP control - monitor  4. PVD / vitreous syneresis - Discussed findings and prognosis - No RT or RD on 360 scleral depressed exam - Reviewed s/s of RT/RD - Strict return precautions for any such RT/RD signs/symptoms - f/u in 3-4 wks -- DFE/OCT  5. Mixed Cataract OU - The symptoms of cataract, surgical  options, and treatments and risks were discussed with patient. - discussed diagnosis and progression - not yet visually significant - monitor for now  Ophthalmic Meds Ordered this visit:  No orders of the defined types were placed in this encounter.    Return in about 3 months (around 04/20/2022) for PAMM OD, DFE, OCT.  There are no Patient Instructions on file for this visit.  Explained the diagnoses, plan, and follow up with the patient and they expressed understanding.  Patient expressed understanding of the importance of proper follow up care.   This document serves as a record of services personally performed by Karie ChimeraBrian G. Russia Scheiderer, MD, PhD. It was created on their behalf by Rennis ChrisBrian Jovita Persing, MD, an ophthalmic technician. The creation of this record is the provider's dictation and/or activities during the visit.    Electronically signed by: Rennis ChrisBrian Lanah Steines, MD  11/30/21 9:49 AM  Karie Chimera, M.D., Ph.D. Diseases & Surgery of the Retina and Vitreous [facility]  I have reviewed the above documentation for accuracy and completeness, and I agree with the above. Karie Chimera, M.D., Ph.D. 01/19/22 1:24 AM  Abbreviations: M myopia (nearsighted); A astigmatism; H hyperopia (farsighted); P presbyopia; Mrx spectacle prescription;  CTL contact lenses; OD right eye; OS left eye; OU both eyes  XT exotropia; ET esotropia; PEK punctate epithelial keratitis; PEE punctate epithelial erosions; DES dry eye syndrome; MGD meibomian gland dysfunction; ATs artificial tears; PFAT's preservative free artificial tears; NSC nuclear sclerotic cataract; PSC posterior subcapsular cataract; ERM epi-retinal membrane; PVD posterior vitreous detachment; RD retinal detachment; DM diabetes mellitus; DR diabetic retinopathy; NPDR non-proliferative diabetic retinopathy; PDR proliferative diabetic retinopathy; CSME clinically significant macular edema; DME diabetic macular edema; dbh dot blot hemorrhages;  CWS cotton wool spot; POAG primary open angle glaucoma; C/D cup-to-disc ratio; HVF humphrey visual field; GVF goldmann visual field; OCT optical coherence tomography; IOP intraocular pressure; BRVO Branch retinal vein occlusion; CRVO central retinal vein occlusion; CRAO central retinal artery occlusion; BRAO branch retinal artery occlusion; RT retinal tear; SB scleral buckle; PPV pars plana vitrectomy; VH Vitreous hemorrhage; PRP panretinal laser photocoagulation; IVK intravitreal kenalog; VMT vitreomacular traction; MH Macular hole;  NVD neovascularization of the disc; NVE neovascularization elsewhere; AREDS age related eye disease study; ARMD age related macular degeneration; POAG primary open angle glaucoma; EBMD epithelial/anterior basement membrane dystrophy; ACIOL anterior chamber intraocular lens; IOL intraocular lens; PCIOL posterior chamber intraocular lens; Phaco/IOL phacoemulsification with intraocular lens placement; PRK photorefractive keratectomy; LASIK laser assisted in situ keratomileusis; HTN hypertension; DM diabetes mellitus; COPD chronic obstructive pulmonary disease

## 2022-01-18 ENCOUNTER — Encounter (INDEPENDENT_AMBULATORY_CARE_PROVIDER_SITE_OTHER): Payer: Self-pay | Admitting: Ophthalmology

## 2022-01-18 ENCOUNTER — Ambulatory Visit (INDEPENDENT_AMBULATORY_CARE_PROVIDER_SITE_OTHER): Payer: Medicare Other | Admitting: Ophthalmology

## 2022-01-18 DIAGNOSIS — H25813 Combined forms of age-related cataract, bilateral: Secondary | ICD-10-CM

## 2022-01-18 DIAGNOSIS — H35033 Hypertensive retinopathy, bilateral: Secondary | ICD-10-CM

## 2022-01-18 DIAGNOSIS — H3530 Unspecified macular degeneration: Secondary | ICD-10-CM

## 2022-01-18 DIAGNOSIS — I10 Essential (primary) hypertension: Secondary | ICD-10-CM | POA: Diagnosis not present

## 2022-01-19 ENCOUNTER — Encounter (INDEPENDENT_AMBULATORY_CARE_PROVIDER_SITE_OTHER): Payer: Self-pay | Admitting: Ophthalmology

## 2022-02-21 ENCOUNTER — Other Ambulatory Visit: Payer: Self-pay

## 2022-02-22 ENCOUNTER — Encounter: Payer: Self-pay | Admitting: Cardiology

## 2022-02-22 ENCOUNTER — Ambulatory Visit: Payer: Medicare Other | Admitting: Cardiology

## 2022-02-22 VITALS — BP 120/76 | HR 78 | Ht 63.0 in | Wt 148.0 lb

## 2022-02-22 DIAGNOSIS — I2510 Atherosclerotic heart disease of native coronary artery without angina pectoris: Secondary | ICD-10-CM

## 2022-02-22 DIAGNOSIS — E782 Mixed hyperlipidemia: Secondary | ICD-10-CM | POA: Diagnosis not present

## 2022-02-22 DIAGNOSIS — I10 Essential (primary) hypertension: Secondary | ICD-10-CM | POA: Diagnosis not present

## 2022-02-22 MED ORDER — CLOPIDOGREL BISULFATE 75 MG PO TABS: 75.0000 mg | ORAL_TABLET | Freq: Every day | ORAL | 3 refills | Status: DC

## 2022-02-22 MED ORDER — PANTOPRAZOLE SODIUM 40 MG PO TBEC: 40.0000 mg | DELAYED_RELEASE_TABLET | Freq: Every day | ORAL | 3 refills | Status: DC

## 2022-02-22 NOTE — Patient Instructions (Signed)
Medication Instructions:  Your physician has recommended you make the following change in your medication:   START: Plavix 75 mg daily START: Protonix 40 mg daily STOP: Aspirin STOP: Prilosec  *If you need a refill on your cardiac medications before your next appointment, please call your pharmacy*   Lab Work: None If you have labs (blood work) drawn today and your tests are completely normal, you will receive your results only by: MyChart Message (if you have MyChart) OR A paper copy in the mail If you have any lab test that is abnormal or we need to change your treatment, we will call you to review the results.   Testing/Procedures: None   Follow-Up: At [facility], you and your health needs are our priority.  As part of our continuing mission to provide you with exceptional heart care, we have created designated Provider Care Teams.  These Care Teams include your primary Cardiologist (physician) and Advanced Practice Providers (APPs -  Physician Assistants and Nurse Practitioners) who all work together to provide you with the care you need, when you need it.  We recommend signing up for the patient portal called "MyChart".  Sign up information is provided on this After Visit Summary.  MyChart is used to connect with patients for Virtual Visits (Telemedicine).  Patients are able to view lab/test results, encounter notes, upcoming appointments, etc.  Non-urgent messages can be sent to your provider as well.   To learn more about what you can do with MyChart, go to [email or web address].    Your next appointment:   9 month(s)  The format for your next appointment:   In Person  Provider:   Norman Herrlich, MD    Other Instructions None  Important Information About Sugar

## 2022-02-22 NOTE — Progress Notes (Signed)
Cardiology Office Note:    Date:  02/22/2022   ID:  Brandy Cervantes, DOB 1946-06-28, MRN [id number]  PCP:  Brandy Islam, NP-C  Cardiologist:  Brandy Herrlich, MD    Referring MD: Brandy Islam, NP-C    ASSESSMENT:    1. CAD in native artery   2. Essential hypertension   3. Mixed hyperlipidemia    PLAN:    In order of problems listed above:  Stable chronic CAD New York Heart Association class I she will continue antiplatelet therapy stopping aspirin restarting clopidogrel along with her oral nitrate and her high intensity statin. BP at target continue current treatment Continue her high intensity statin   Next appointment: 9 months   Medication Adjustments/Labs and Tests Ordered: Current medicines are reviewed at length with the patient today.  Concerns regarding medicines are outlined above.  No orders of the defined types were placed in this encounter.  Meds ordered this encounter  Medications   clopidogrel (PLAVIX) 75 MG tablet    Sig: Take 1 tablet (75 mg total) by mouth daily.    Dispense:  90 tablet    Refill:  3   pantoprazole (PROTONIX) 40 MG tablet    Sig: Take 1 tablet (40 mg total) by mouth daily.    Dispense:  90 tablet    Refill:  3    Chief Complaint  Patient presents with   Follow-up   Coronary Artery Disease    History of Present Illness:    Brandy Cervantes is a 76 y.o. 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  She had follow-up coronary angiography performed in March 2022 with patent stent to the LAD and mild nonflow flow-limiting stenosis right coronary artery last seen 09/20/2020.  Compliance with diet, lifestyle and medications: Yes  Doing well in some fashion her clopidogrel was stopped when she left the hospital in May and she chooses to stay on clopidogrel and stop aspirin.  She will transition from Prilosec to Protonix. She has had no angina  dyspnea palpitation or syncope Past Medical History:  Diagnosis Date   Atrophic flaccid tympanic membrane 10/18/2015   Bilateral primary osteoarthritis of knee 11/17/2016   Bronchiectasis without complication (HCC) 07/30/2019   Formatting of this note might be different from the original. Chest CT 06/20/19   Chest pain 03/03/2018   Chronic pain 10/18/2015   Corns of multiple toes 09/20/2018   Coronary artery disease involving native coronary artery of native heart with angina pectoris (HCC) 02/04/2018   Degenerative disc disease, cervical 10/28/2013   Degenerative disc disease, lumbar 10/28/2013   Depression    Esophageal dysphagia 12/31/2017   Essential hypertension 10/18/2015   Exposure to TB    "my mother died when she was 32 of TB"   Forgetfulness 12/22/2019   GAD (generalized anxiety disorder) 10/18/2015   GERD (gastroesophageal reflux disease) 12/31/2017   History of angina 01/27/2018   History of gout    "I've had it occasionally" (11/23/2016)   Hypotension 07/24/2018   Insomnia 10/18/2015   Low back pain 10/28/2013   Migraine    "a few/year; more in the last few weeks" (01/29/2018)   Mixed hyperlipidemia 10/19/2017   Ocular migraine    Osteoarthritis 10/18/2015   Osteoporosis 10/18/2015   Other chest pain 12/31/2017   Pain syndrome, chronic 10/28/2013   Presence of drug coated stent in LAD coronary artery 02/07/2018   Progressive angina (HCC)  Raynaud's disease    feet   Recurrent major depressive disorder, in remission (HCC) 01/08/2020   Screening for cervical cancer 04/11/2021   Formatting of this note might be different from the original. 04/11/2021: declined further testing, discussed   Screening for colon cancer 04/11/2021   Formatting of this note might be different from the original. 04/11/2021: declined further testing, discussed   TIA (transient ischemic attack) 11/27/2021   Unstable angina (HCC) 01/27/2018   Vaccination declined by patient 04/11/2021   Formatting  of this note might be different from the original. 04/11/2021: all, discussed   Vision disturbance    Visual disturbances 11/28/2021    Past Surgical History:  Procedure Laterality Date   CARDIAC CATHETERIZATION     CORONARY ANGIOPLASTY     CORONARY STENT INTERVENTION N/A 01/29/2018   Procedure: CORONARY STENT INTERVENTION;  Surgeon: Lennette Bihari, MD;  Location: [facility] INVASIVE CV LAB;  Service: Cardiovascular;  Laterality: N/A;   JOINT REPLACEMENT     LEFT HEART CATH AND CORONARY ANGIOGRAPHY N/A 01/29/2018   Procedure: LEFT HEART CATH AND CORONARY ANGIOGRAPHY;  Surgeon: Lennette Bihari, MD;  Location: [facility] INVASIVE CV LAB;  Service: Cardiovascular;  Laterality: N/A;   LEFT HEART CATH AND CORONARY ANGIOGRAPHY N/A 10/08/2020   Procedure: LEFT HEART CATH AND CORONARY ANGIOGRAPHY;  Surgeon: Marykay Lex, MD;  Location: [facility] INVASIVE CV LAB;  Service: Cardiovascular;  Laterality: N/A;   LEFT HEART CATH AND CORS/GRAFTS ANGIOGRAPHY N/A 03/04/2018   Procedure: LEFT HEART CATH AND CORS/GRAFTS ANGIOGRAPHY;  Surgeon: [name], MD;  Location: [facility] INVASIVE CV LAB;  Service: Cardiovascular;  Laterality: N/A;   MASTOIDECTOMY Right ~ 1955   TOTAL KNEE ARTHROPLASTY Bilateral 11/22/2016   Procedure: TOTAL KNEE BILATERAL;  Surgeon: Gean Birchwood, MD;  Location: [facility] OR;  Service: Orthopedics;  Laterality: Bilateral;    Current Medications: Current Meds  Medication Sig   albuterol (VENTOLIN HFA) 108 (90 Base) MCG/ACT inhaler Inhale 2 puffs into the lungs every 6 (six) hours as needed for wheezing or shortness of breath.   Ascorbic Acid (VITAMIN C) 1000 MG tablet Take 1,000 mg by mouth daily at 12 noon.   b complex vitamins capsule Take 1 capsule by mouth daily at 12 noon. Super   Cholecalciferol (VITAMIN D3) 1000 units CAPS Take 1,000 Units by mouth daily.   clopidogrel (PLAVIX) 75 MG tablet Take 1 tablet (75 mg total) by mouth daily.   CYMBALTA 60 MG capsule Take 60 mg by mouth daily.   Docusate Sodium  (DSS) 100 MG CAPS Take 100 mg by mouth as needed for constipation.   gabapentin (NEURONTIN) 100 MG capsule Take 100 mg by mouth daily.   HYDROcodone-acetaminophen (NORCO) 10-325 MG tablet Take 1 tablet by mouth every 6 (six) hours as needed for moderate pain.   hydrOXYzine (ATARAX/VISTARIL) 25 MG tablet Take 25 mg by mouth See admin instructions. Take 1 in the morning, 1 around lunch time, and 2 at night   isosorbide mononitrate (IMDUR) 60 MG 24 hr tablet TAKE 1 TABLET BY MOUTH ONCE DAILY   morphine (MS CONTIN) 30 MG 12 hr tablet Take 30 mg by mouth every 12 (twelve) hours.   Multiple Vitamins-Minerals (PRESERVISION AREDS 2) CAPS Take 1 tablet by mouth daily at 12 noon.   Multiple Vitamins-Minerals (SENIOR MULTIVITAMIN PLUS) TABS Take 1 tablet by mouth daily at 12 noon. With Iron   nitroGLYCERIN (NITROSTAT) 0.4 MG SL tablet Place 0.4 mg under the tongue every 5 (five) minutes as needed for  chest pain.   OVER THE COUNTER MEDICATION Take 4-5 tablets by mouth at bedtime as needed (for sleep). Calms Forte   oxymetazoline (AFRIN) 0.05 % nasal spray Place 1 spray into both nostrils at bedtime.   pantoprazole (PROTONIX) 40 MG tablet Take 1 tablet (40 mg total) by mouth daily.   rosuvastatin (CRESTOR) 20 MG tablet Take 20 mg by mouth daily.   [DISCONTINUED] aspirin EC 81 MG tablet Take 1 tablet (81 mg total) by mouth daily.   [DISCONTINUED] omeprazole (PRILOSEC) 40 MG capsule Take 40 mg by mouth daily.     Allergies:   Aspirin, Lisinopril, Topiramate, Alendronate sodium, Celecoxib, Diphenhydramine hcl, Gabapentin, Nsaids, Pravastatin, and Sumatriptan   Social History   Socioeconomic History   Marital status: Married    Spouse name: Not on file   Number of children: Not on file   Years of education: Not on file   Highest education level: Not on file  Occupational History   Not on file  Tobacco Use   Smoking status: Never   Smokeless tobacco: Never  Vaping Use   Vaping Use: Never used   Substance and Sexual Activity   Alcohol use: No   Drug use: No   Sexual activity: Not Currently  Other Topics Concern   Not on file  Social History Narrative   Not on file   Social Determinants of Health   Financial Resource Strain: Not on file  Food Insecurity: Not on file  Transportation Needs: Not on file  Physical Activity: Not on file  Stress: Not on file  Social Connections: Not on file     Family History: The patient's family history includes Heart attack in her father; Heart disease in her brother; Hyperlipidemia in her father; Tuberculosis in her mother. ROS:   Please see the history of present illness.    All other systems reviewed and are negative.  EKGs/Labs/Other Studies Reviewed:    The following studies were reviewed today:  EKG:  EKG inpatient [facility] 11/27/2021 independently reviewed sinus rhythm normal  Recent Labs: 11/27/2021: ALT 20; BUN 16; Creatinine, Ser 0.70; Hemoglobin 13.9; Platelets 313; Potassium 4.3; Sodium 139; TSH 1.353 11/28/2021: Magnesium 2.3  Recent Lipid Panel    Component Value Date/Time   CHOL 233 (H) 11/27/2021 1526   CHOL 169 03/23/2021 1311   TRIG 286 (H) 11/27/2021 1526   HDL 52 11/27/2021 1526   HDL 48 03/23/2021 1311   CHOLHDL 4.5 11/27/2021 1526   VLDL 57 (H) 11/27/2021 1526   LDLCALC 124 (H) 11/27/2021 1526   LDLCALC 87 03/23/2021 1311    Physical Exam:    VS:  BP 120/76   Pulse 78   Ht 5\' 3"  (1.6 m)   Wt 148 lb (67.1 kg)   SpO2 97%   BMI 26.22 kg/m     Wt Readings from Last 3 Encounters:  02/22/22 148 lb (67.1 kg)  11/28/21 154 lb 5.2 oz (70 kg)  09/20/21 153 lb (69.4 kg)     GEN:  Well nourished, well developed in no acute distress HEENT: Normal NECK: No JVD; No carotid bruits LYMPHATICS: No lymphadenopathy CARDIAC: RRR, no murmurs, rubs, gallops RESPIRATORY:  Clear to auscultation without rales, wheezing or rhonchi  ABDOMEN: Soft, non-tender, non-distended MUSCULOSKELETAL:  No edema; No  deformity  SKIN: Warm and dry NEUROLOGIC:  Alert and oriented x 3 PSYCHIATRIC:  Normal affect    Signed, 11/20/21, MD  02/22/2022 2:28 PM    [facility]

## 2022-03-18 ENCOUNTER — Other Ambulatory Visit: Payer: Self-pay | Admitting: Cardiology

## 2022-04-11 NOTE — Progress Notes (Addendum)
[facility] - Clinic Note  04/19/2022     CHIEF COMPLAINT Patient presents for Retina Follow Up   HISTORY OF PRESENT ILLNESS: Brandy Cervantes is a 76 y.o. female who presents to the clinic today for:   HPI     Retina Follow Up   Patient presents with  Other.  In right eye.  This started 3 months ago.  I, the attending physician,  performed the HPI with the patient and updated documentation appropriately.        Comments   Patient here for 3 months retina follow up for PAMM OD. Patient  states vision doing the same. No eye pain.       Last edited by Rennis Chris, MD on 04/21/2022 12:10 AM.    Pt states vision is doing well, she states letters are still blocked out by the spot in her vision  Referring physician: Delmer Islam, NP-C [address] [place],  [place] [address]  HISTORICAL INFORMATION:   Selected notes from the MEDICAL RECORD NUMBER Referred by Dr. Zenaida Niece for concern of paracentral middle maculopathy OD LEE:  Ocular Hx- PMH-    CURRENT MEDICATIONS: No current outpatient medications on file. (Ophthalmic Drugs)   No current facility-administered medications for this visit. (Ophthalmic Drugs)   Current Outpatient Medications (Other)  Medication Sig   albuterol (VENTOLIN HFA) 108 (90 Base) MCG/ACT inhaler Inhale 2 puffs into the lungs every 6 (six) hours as needed for wheezing or shortness of breath.   Ascorbic Acid (VITAMIN C) 1000 MG tablet Take 1,000 mg by mouth daily at 12 noon.   b complex vitamins capsule Take 1 capsule by mouth daily at 12 noon. Super   Cholecalciferol (VITAMIN D3) 1000 units CAPS Take 1,000 Units by mouth daily.   clopidogrel (PLAVIX) 75 MG tablet Take 1 tablet (75 mg total) by mouth daily.   CYMBALTA 60 MG capsule Take 60 mg by mouth daily.   Docusate Sodium (DSS) 100 MG CAPS Take 100 mg by mouth as needed for constipation.   gabapentin (NEURONTIN) 100 MG capsule Take 100 mg by mouth daily.   HYDROcodone-acetaminophen  (NORCO) 10-325 MG tablet Take 1 tablet by mouth every 6 (six) hours as needed for moderate pain.   hydrOXYzine (ATARAX/VISTARIL) 25 MG tablet Take 25 mg by mouth See admin instructions. Take 1 in the morning, 1 around lunch time, and 2 at night   isosorbide mononitrate (IMDUR) 60 MG 24 hr tablet TAKE 1 TABLET BY MOUTH ONCE DAILY   morphine (MS CONTIN) 30 MG 12 hr tablet Take 30 mg by mouth every 12 (twelve) hours.   Multiple Vitamins-Minerals (PRESERVISION AREDS 2) CAPS Take 1 tablet by mouth daily at 12 noon.   Multiple Vitamins-Minerals (SENIOR MULTIVITAMIN PLUS) TABS Take 1 tablet by mouth daily at 12 noon. With Iron   nitroGLYCERIN (NITROSTAT) 0.4 MG SL tablet Place 0.4 mg under the tongue every 5 (five) minutes as needed for chest pain.   OVER THE COUNTER MEDICATION Take 4-5 tablets by mouth at bedtime as needed (for sleep). Calms Forte   oxymetazoline (AFRIN) 0.05 % nasal spray Place 1 spray into both nostrils at bedtime.   pantoprazole (PROTONIX) 40 MG tablet Take 1 tablet (40 mg total) by mouth daily.   rosuvastatin (CRESTOR) 20 MG tablet Take 20 mg by mouth daily.   No current facility-administered medications for this visit. (Other)   REVIEW OF SYSTEMS: ROS   Positive for: Eyes Last edited by Laddie Aquas,  COA on 04/19/2022  9:02 AM.     ALLERGIES Allergies  Allergen Reactions   Aspirin Nausea Only and Other (See Comments)    Stomach pain / can take 81 mg EC      Lisinopril Cough   Topiramate     depression   Alendronate Sodium Nausea And Vomiting   Celecoxib Nausea Only   Diphenhydramine Hcl Other (See Comments) and Rash   Gabapentin Nausea Only and Other (See Comments)    Stomach pain     Nsaids Nausea Only   Pravastatin Rash   Sumatriptan Rash   PAST MEDICAL HISTORY Past Medical History:  Diagnosis Date   Atrophic flaccid tympanic membrane 10/18/2015   Bilateral primary osteoarthritis of knee 11/17/2016   Bronchiectasis without complication (HCC)  07/30/2019   Formatting of this note might be different from the original. Chest CT 06/20/19   Chest pain 03/03/2018   Chronic pain 10/18/2015   Corns of multiple toes 09/20/2018   Coronary artery disease involving native coronary artery of native heart with angina pectoris (HCC) 02/04/2018   Degenerative disc disease, cervical 10/28/2013   Degenerative disc disease, lumbar 10/28/2013   Depression    Esophageal dysphagia 12/31/2017   Essential hypertension 10/18/2015   Exposure to TB    "my mother died when she was 32 of TB"   Forgetfulness 12/22/2019   GAD (generalized anxiety disorder) 10/18/2015   GERD (gastroesophageal reflux disease) 12/31/2017   History of angina 01/27/2018   History of gout    "I've had it occasionally" (11/23/2016)   Hypotension 07/24/2018   Insomnia 10/18/2015   Low back pain 10/28/2013   Migraine    "a few/year; more in the last few weeks" (01/29/2018)   Mixed hyperlipidemia 10/19/2017   Ocular migraine    Osteoarthritis 10/18/2015   Osteoporosis 10/18/2015   Other chest pain 12/31/2017   Pain syndrome, chronic 10/28/2013   Presence of drug coated stent in LAD coronary artery 02/07/2018   Progressive angina (HCC)    Raynaud's disease    feet   Recurrent major depressive disorder, in remission (HCC) 01/08/2020   Screening for cervical cancer 04/11/2021   Formatting of this note might be different from the original. 04/11/2021: declined further testing, discussed   Screening for colon cancer 04/11/2021   Formatting of this note might be different from the original. 04/11/2021: declined further testing, discussed   TIA (transient ischemic attack) 11/27/2021   Unstable angina (HCC) 01/27/2018   Vaccination declined by patient 04/11/2021   Formatting of this note might be different from the original. 04/11/2021: all, discussed   Vision disturbance    Visual disturbances 11/28/2021   Past Surgical History:  Procedure Laterality Date   CARDIAC CATHETERIZATION      CORONARY ANGIOPLASTY     CORONARY STENT INTERVENTION N/A 01/29/2018   Procedure: CORONARY STENT INTERVENTION;  Surgeon: Lennette Bihari, MD;  Location: [facility] INVASIVE CV LAB;  Service: Cardiovascular;  Laterality: N/A;   JOINT REPLACEMENT     LEFT HEART CATH AND CORONARY ANGIOGRAPHY N/A 01/29/2018   Procedure: LEFT HEART CATH AND CORONARY ANGIOGRAPHY;  Surgeon: Lennette Bihari, MD;  Location: [facility] INVASIVE CV LAB;  Service: Cardiovascular;  Laterality: N/A;   LEFT HEART CATH AND CORONARY ANGIOGRAPHY N/A 10/08/2020   Procedure: LEFT HEART CATH AND CORONARY ANGIOGRAPHY;  Surgeon: Marykay Lex, MD;  Location: [facility] INVASIVE CV LAB;  Service: Cardiovascular;  Laterality: N/A;   LEFT HEART CATH AND CORS/GRAFTS ANGIOGRAPHY N/A 03/04/2018   Procedure: LEFT  HEART CATH AND CORS/GRAFTS ANGIOGRAPHY;  Surgeon: Yvonne KendallEnd, Christopher, MD;  Location: [facility] INVASIVE CV LAB;  Service: Cardiovascular;  Laterality: N/A;   MASTOIDECTOMY Right ~ 1955   TOTAL KNEE ARTHROPLASTY Bilateral 11/22/2016   Procedure: TOTAL KNEE BILATERAL;  Surgeon: Gean Birchwoodowan, Frank, MD;  Location: [facility] OR;  Service: Orthopedics;  Laterality: Bilateral;   FAMILY HISTORY Family History  Problem Relation Age of Onset   Tuberculosis Mother    Heart attack Father    Hyperlipidemia Father    Heart disease Brother    SOCIAL HISTORY Social History   Tobacco Use   Smoking status: Never   Smokeless tobacco: Never  Vaping Use   Vaping Use: Never used  Substance Use Topics   Alcohol use: No   Drug use: No       OPHTHALMIC EXAM:  Base Eye Exam     Visual Acuity (Snellen - Linear)       Right Left   Dist [place] 20/40 -2 20/40 -1   Dist ph [place] 20/20 -2 20/25  Didn't bring glasses        Tonometry (Tonopen, 9:00 AM)       Right Left   Pressure 19 18         Pupils       Dark Light Shape React APD   Right 4 3 Round Brisk None   Left 4 3 Round Brisk None         Visual Fields (Counting fingers)       Left Right    Full Full          Extraocular Movement       Right Left    Full, Ortho Full, Ortho         Neuro/Psych     Oriented x3: Yes   Mood/Affect: Normal         Dilation     Both eyes: 1.0% Mydriacyl, 2.5% Phenylephrine @ 9:00 AM           Slit Lamp and Fundus Exam     Slit Lamp Exam       Right Left   Lids/Lashes Dermatochalasis - upper lid, mild Telangiectasia Dermatochalasis - upper lid, mild Telangiectasia   Conjunctiva/Sclera White and quiet White and quiet   Cornea arcus, 1+ Punctate epithelial erosions, decreased TBUT, tear film debris arcus, 1+ Punctate epithelial erosions, mild tear film debris   Anterior Chamber Deep and quiet Deep and quiet   Iris Round and dilated Round and dilated   Lens 2+ Nuclear sclerosis, 2-3+ Cortical cataract 2+ Nuclear sclerosis, 2-3+ Cortical cataract   Anterior Vitreous mild syneresis, Posterior vitreous detachment mild syneresis, Posterior vitreous detachment         Fundus Exam       Right Left   Disc Pink and Sharp, mild temporal PPA Pink and Sharp, mild PPA   C/D Ratio 0.5 0.3   Macula Flat, Blunted foveal reflex, RPE mottling, No heme or edema, no retinal whitening Flat, Blunted foveal reflex, mild RPE mottling, No heme or edema   Vessels attenuated, mild tortuosity attenuated, mild tortuosity   Periphery Attached, No heme Attached           Refraction     Wearing Rx       Sphere Cylinder Axis Add   Right -0.75 +2.75 159 +1.75   Left -1.25 +2.25 019 +1.75           IMAGING AND PROCEDURES  Imaging and Procedures for 04/19/2022  OCT, Retina - OU - Both Eyes       Right Eye Quality was good. Central Foveal Thickness: 260. Progression has improved. Findings include normal foveal contour, no IRF, no SRF, intraretinal hyper-reflective material (mild IRHM nasal fovea -- very faint and improved from prior).   Left Eye Quality was good. Central Foveal Thickness: 258. Progression has been stable. Findings include normal foveal  contour, no IRF, no SRF.   Notes *Images captured and stored on drive  Diagnosis / Impression:  NFP, no IRF/SRF OU OD: mild IRHM nasal fovea -- very faint and improved from prior  Clinical management:  See below  Abbreviations: NFP - Normal foveal profile. CME - cystoid macular edema. PED - pigment epithelial detachment. IRF - intraretinal fluid. SRF - subretinal fluid. EZ - ellipsoid zone. ERM - epiretinal membrane. ORA - outer retinal atrophy. ORT - outer retinal tubulation. SRHM - subretinal hyper-reflective material. 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 Essential hypertension  I10     3. Hypertensive retinopathy of both eyes  H35.033     4. Posterior vitreous detachment of both eyes  H43.813     5. Combined forms of age-related cataract of both eyes  H25.813      1. Paracentral acute middle maculopathy (PAMM)  - pt reports on Saturday, 5.13.23, was bent over and developed "sparkles in vision" that later progressed to focal paracentral scotoma  - presented to ED on 5.14.23 where stroke work up was negative  - was seen by Dr. Zenaida Niece in hospital and in office on 5.15.23 where concern for PAMM was noted on OCT  - pt referred here for retinal eval -- initial visit 05.23.23  - today, OCT shows mild IRHM nasal fovea -- very faint and improved from prior - FA (05.23.23) shows no leakage or obvious hypofluorescence OU - discussed findings, prognosis - no retinal or ophthalmic interventions indicated or recommended  - recommend optimization of cardiovascular risk factors -- pt reports she has cardiologist (Dr. Dulce Sellar in [place]), but hasn't followed up in a while - pt had sonogram of carotid dopplers - everything was clear per pt - f/u 9 months, DFE, OCT  2,3. Hypertensive retinopathy OU - discussed importance of tight BP control -  monitor  4. PVD OU - Discussed findings and prognosis - No RT or RD on 360 exam - Reviewed s/s of RT/RD - Strict return precautions for any such RT/RD signs/symptoms - monitor  5. Mixed Cataract OU - The symptoms of cataract, surgical options, and treatments and risks were discussed with patient. - discussed diagnosis and progression - not yet visually significant - monitor for now  Ophthalmic Meds Ordered this visit:  No orders of the defined types were placed in this encounter.    Return in about 9 months (around 01/18/2023) for PAMM OD, DFE, OCT.  There are no Patient Instructions on file for this visit.  Explained the diagnoses, plan, and follow up with the patient and they expressed understanding.  Patient expressed understanding of the importance of proper follow up care.   This document serves as a record of services personally performed by Karie Chimera, MD, PhD. It was created on their behalf by De Blanch, an ophthalmic technician. The creation of this record is the provider's dictation and/or activities during  the visit.    Electronically signed by: Orvan Falconer, OA, 04/21/22  12:16 AM  This document serves as a record of services personally performed by Gardiner Sleeper, MD, PhD. It was created on their behalf by San Jetty. Owens Shark, OA an ophthalmic technician. The creation of this record is the provider's dictation and/or activities during the visit.    Electronically signed by: San Jetty. Owens Shark, [place] 10.04.2023 12:16 AM  Gardiner Sleeper, M.D., Ph.D. Diseases & Surgery of the Retina and Vitreous [facility]  I have reviewed the above documentation for accuracy and completeness, and I agree with the above. Gardiner Sleeper, M.D., Ph.D. 04/21/22 12:16 AM   Abbreviations: M myopia (nearsighted); A astigmatism; H hyperopia (farsighted); P presbyopia; Mrx spectacle prescription;  CTL contact lenses; OD right eye; OS left eye; OU both eyes  XT  exotropia; ET esotropia; PEK punctate epithelial keratitis; PEE punctate epithelial erosions; DES dry eye syndrome; MGD meibomian gland dysfunction; ATs artificial tears; PFAT's preservative free artificial tears; [place] nuclear sclerotic cataract; PSC posterior subcapsular cataract; ERM epi-retinal membrane; PVD posterior vitreous detachment; RD retinal detachment; DM diabetes mellitus; DR diabetic retinopathy; NPDR non-proliferative diabetic retinopathy; PDR proliferative diabetic retinopathy; CSME clinically significant macular edema; DME diabetic macular edema; dbh dot blot hemorrhages; CWS cotton wool spot; POAG primary open angle glaucoma; C/D cup-to-disc ratio; HVF humphrey visual field; GVF goldmann visual field; OCT optical coherence tomography; IOP intraocular pressure; BRVO Branch retinal vein occlusion; CRVO central retinal vein occlusion; CRAO central retinal artery occlusion; BRAO branch retinal artery occlusion; RT retinal tear; SB scleral buckle; PPV pars plana vitrectomy; VH Vitreous hemorrhage; PRP panretinal laser photocoagulation; IVK intravitreal kenalog; VMT vitreomacular traction; MH Macular hole;  NVD neovascularization of the disc; NVE neovascularization elsewhere; AREDS age related eye disease study; ARMD age related macular degeneration; POAG primary open angle glaucoma; EBMD epithelial/anterior basement membrane dystrophy; ACIOL anterior chamber intraocular lens; IOL intraocular lens; PCIOL posterior chamber intraocular lens; Phaco/IOL phacoemulsification with intraocular lens placement; [place] photorefractive keratectomy; LASIK laser assisted in situ keratomileusis; HTN hypertension; DM diabetes mellitus; COPD chronic obstructive pulmonary disease

## 2022-04-19 ENCOUNTER — Encounter (INDEPENDENT_AMBULATORY_CARE_PROVIDER_SITE_OTHER): Payer: Self-pay | Admitting: Ophthalmology

## 2022-04-19 ENCOUNTER — Ambulatory Visit (INDEPENDENT_AMBULATORY_CARE_PROVIDER_SITE_OTHER): Payer: Medicare Other | Admitting: Ophthalmology

## 2022-04-19 DIAGNOSIS — H3530 Unspecified macular degeneration: Secondary | ICD-10-CM | POA: Diagnosis not present

## 2022-04-19 DIAGNOSIS — H43813 Vitreous degeneration, bilateral: Secondary | ICD-10-CM | POA: Diagnosis not present

## 2022-04-19 DIAGNOSIS — H35033 Hypertensive retinopathy, bilateral: Secondary | ICD-10-CM

## 2022-04-19 DIAGNOSIS — I10 Essential (primary) hypertension: Secondary | ICD-10-CM

## 2022-04-19 DIAGNOSIS — H25813 Combined forms of age-related cataract, bilateral: Secondary | ICD-10-CM

## 2022-04-21 ENCOUNTER — Encounter (INDEPENDENT_AMBULATORY_CARE_PROVIDER_SITE_OTHER): Payer: Self-pay | Admitting: Ophthalmology

## 2022-08-31 ENCOUNTER — Telehealth: Payer: Self-pay | Admitting: Cardiology

## 2022-08-31 NOTE — Telephone Encounter (Signed)
Patient states that she has been experiencing extreme tiredness and unable to get any sleep. Please advise

## 2022-09-01 NOTE — Telephone Encounter (Signed)
Spoke with pt. She stated that she has has heart issues in the past. She is beginning to experience some fatigue and occasional chest pressure. Sent to front desk for appt.

## 2022-09-08 NOTE — Progress Notes (Unsigned)
Cardiology Office Note:    Date:  09/11/2022   ID:  Brandy Cervantes, DOB Jun 29, 1946, MRN [id number]  PCP:  Merry Lofty, NP-C  Cardiologist:  Shirlee More, MD    Referring MD: Merry Lofty, NP-C    ASSESSMENT:    1. Other fatigue   2. CAD in native artery   3. Essential hypertension   4. Mixed hyperlipidemia    PLAN:    In order of problems listed above:  We reviewed the fatigue often is multifactorial probably more common noncardiac than cardiac I asked her to keep her dose of Neurontin to a minimum she tells me she cannot decrease her opiate because of pain she is not having symptoms to suggest sleep apnea she has had no lab work done recently we will check routine labs including a TSH CBC and electrolytes. Continue current medical treatment she will undergo myocardial perfusion study in the office Blood pressure is target continue current treatment Continue her statin we will check a lipid profile   Next appointment: 6 months   Medication Adjustments/Labs and Tests Ordered: Current medicines are reviewed at length with the patient today.  Concerns regarding medicines are outlined above.  No orders of the defined types were placed in this encounter.  No orders of the defined types were placed in this encounter.   Chief complaint exercise intolerance fatigue chest pain but has not required nitroglycerin   History of Present Illness:    Brandy Cervantes is a 77 y.o. female with a hx of  CAD drug-eluting stent to the LAD 2019 subsequent recurrent angina due to coronary vasospasm hypertension hyperlipidemia.  She had follow-up coronary angiography performed in March 2022 with patent stent to the LAD and mild nonflow flow-limiting stenosis right coronary artery last seen 02/22/2022.  Compliance with diet, lifestyle and medications: Yes  Is a very complex visit-tarts off as fatigue and exercise intolerance.  She takes centrally active medications opiate plus Neurontin  however both her and her husband say that she has shortness of breath with and without activities and has had chest discomfort and very concerned about progressive CAD. Past Medical History:  Diagnosis Date   Atrophic flaccid tympanic membrane 10/18/2015   Bilateral primary osteoarthritis of knee 11/17/2016   Bronchiectasis without complication ([place]) [id number]   Formatting of this note might be different from the original. Chest CT 06/20/19   Chest pain 03/03/2018   Chronic pain 10/18/2015   Corns of multiple toes 09/20/2018   Coronary artery disease involving native coronary artery of native heart with angina pectoris ([place]) 02/04/2018   Degenerative disc disease, cervical 10/28/2013   Degenerative disc disease, lumbar 10/28/2013   Depression    Esophageal dysphagia 12/31/2017   Essential hypertension 10/18/2015   Exposure to TB    "my mother died when she was 44 of TB"   Forgetfulness 12/22/2019   GAD (generalized anxiety disorder) 10/18/2015   GERD (gastroesophageal reflux disease) 12/31/2017   History of angina 01/27/2018   History of gout    "I've had it occasionally" (11/23/2016)   Hypotension 07/24/2018   Insomnia 10/18/2015   Low back pain 10/28/2013   Migraine    "a few/year; more in the last few weeks" (01/29/2018)   Mixed hyperlipidemia 10/19/2017   Ocular migraine    Osteoarthritis 10/18/2015   Osteoporosis 10/18/2015   Other chest pain 12/31/2017   Pain syndrome, chronic 10/28/2013   Presence of drug coated stent in LAD coronary artery 02/07/2018   Progressive angina (HCC)  Raynaud's disease    feet   Recurrent major depressive disorder, in remission ([place]) 01/08/2020   Screening for cervical cancer 04/11/2021   Formatting of this note might be different from the original. 04/11/2021: declined further testing, discussed   Screening for colon cancer 04/11/2021   Formatting of this note might be different from the original. 04/11/2021: declined further testing, discussed    TIA (transient ischemic attack) 11/27/2021   Unstable angina ([place]) 01/27/2018   Vaccination declined by patient 04/11/2021   Formatting of this note might be different from the original. 04/11/2021: all, discussed   Vision disturbance    Visual disturbances 11/28/2021    Past Surgical History:  Procedure Laterality Date   CARDIAC CATHETERIZATION     CORONARY ANGIOPLASTY     CORONARY STENT INTERVENTION N/A 01/29/2018   Procedure: CORONARY STENT INTERVENTION;  Surgeon: Cervantes Sine, MD;  Location: [facility];  Service: Cardiovascular;  Laterality: N/A;   JOINT REPLACEMENT     LEFT HEART CATH AND CORONARY ANGIOGRAPHY N/A 01/29/2018   Procedure: LEFT HEART CATH AND CORONARY ANGIOGRAPHY;  Surgeon: Cervantes Sine, MD;  Location: [facility];  Service: Cardiovascular;  Laterality: N/A;   LEFT HEART CATH AND CORONARY ANGIOGRAPHY N/A 10/08/2020   Procedure: LEFT HEART CATH AND CORONARY ANGIOGRAPHY;  Surgeon: Leonie Man, MD;  Location: [place];  Service: Cardiovascular;  Laterality: N/A;   LEFT HEART CATH AND CORS/GRAFTS ANGIOGRAPHY N/A 03/04/2018   Procedure: LEFT HEART CATH AND CORS/GRAFTS ANGIOGRAPHY;  Surgeon: Nelva Bush, MD;  Location: [facility];  Service: Cardiovascular;  Laterality: N/A;   MASTOIDECTOMY Right ~ [place] Bilateral 11/22/2016   Procedure: TOTAL KNEE BILATERAL;  Surgeon: Frederik Pear, MD;  Location: [facility];  Service: Orthopedics;  Laterality: Bilateral;    Current Medications: Current Meds  Medication Sig   albuterol (VENTOLIN HFA) 108 (90 Base) MCG/ACT inhaler Inhale 2 puffs into the lungs every 6 (six) hours as needed for wheezing or shortness of breath.   Ascorbic Acid (VITAMIN C) 1000 MG tablet Take 1,000 mg by mouth daily at 12 noon.   b complex vitamins capsule Take 1 capsule by mouth daily at 12 noon. Super   Cholecalciferol (VITAMIN D3) 1000 units CAPS Take 1,000 Units by mouth daily.   clopidogrel  (PLAVIX) 75 MG tablet Take 1 tablet (75 mg total) by mouth daily.   CYMBALTA 60 MG capsule Take 60 mg by mouth daily.   Docusate Sodium (DSS) 100 MG CAPS Take 100 mg by mouth as needed for constipation.   gabapentin (NEURONTIN) 100 MG capsule Take 100 mg by mouth daily.   HYDROcodone-acetaminophen (NORCO) 10-325 MG tablet Take 1 tablet by mouth every 6 (six) hours as needed for moderate pain.   hydrOXYzine (ATARAX/VISTARIL) 25 MG tablet Take 25 mg by mouth See admin instructions. Take 1 in the morning, 1 around lunch time, and 2 at night   isosorbide mononitrate (IMDUR) 60 MG 24 hr tablet TAKE 1 TABLET BY MOUTH ONCE DAILY   morphine (MS CONTIN) 30 MG 12 hr tablet Take 30 mg by mouth every 12 (twelve) hours.   Multiple Vitamins-Minerals (PRESERVISION AREDS 2) CAPS Take 1 tablet by mouth daily at 12 noon.   Multiple Vitamins-Minerals (SENIOR MULTIVITAMIN PLUS) TABS Take 1 tablet by mouth daily at 12 noon. With Iron   nitroGLYCERIN (NITROSTAT) 0.4 MG SL tablet Place 0.4 mg under the tongue every 5 (five) minutes as needed for  chest pain.   OVER THE COUNTER MEDICATION Take 4-5 tablets by mouth at bedtime as needed (for sleep). Calms Forte   oxymetazoline (AFRIN) 0.05 % nasal spray Place 1 spray into both nostrils at bedtime.   rosuvastatin (CRESTOR) 20 MG tablet Take 20 mg by mouth daily.     Allergies:   Aspirin, Lisinopril, Topiramate, Alendronate sodium, Celecoxib, Diphenhydramine hcl, Gabapentin, Nsaids, Pravastatin, and Sumatriptan   Social History   Socioeconomic History   Marital status: Married    Spouse name: Not on file   Number of children: Not on file   Years of education: Not on file   Highest education level: Not on file  Occupational History   Not on file  Tobacco Use   Smoking status: Never   Smokeless tobacco: Never  Vaping Use   Vaping Use: Never used  Substance and Sexual Activity   Alcohol use: No   Drug use: No   Sexual activity: Not Currently  Other Topics  Concern   Not on file  Social History Narrative   Not on file   Social Determinants of Health   Financial Resource Strain: Not on file  Food Insecurity: Not on file  Transportation Needs: Not on file  Physical Activity: Not on file  Stress: Not on file  Social Connections: Not on file     Family History: The patient's family history includes Heart attack in her father; Heart disease in her brother; Hyperlipidemia in her father; Tuberculosis in her mother. ROS:   Please see the history of present illness.    All other systems reviewed and are negative.  EKGs/Labs/Other Studies Reviewed:    The following studies were reviewed today:  EKG:  EKG ordered today and personally reviewed.  The ekg ordered today demonstrates sinus rhythm and is normal  Recent Labs: 11/27/2021: ALT 20; BUN 16; Creatinine, Ser 0.70; Hemoglobin 13.9; Platelets 313; Potassium 4.3; Sodium 139; TSH 1.353 11/28/2021: Magnesium 2.3  Recent Lipid Panel    Component Value Date/Time   CHOL 233 (H) 11/27/2021 1526   CHOL 169 03/23/2021 1311   TRIG 286 (H) 11/27/2021 1526   HDL 52 11/27/2021 1526   HDL 48 03/23/2021 1311   CHOLHDL 4.5 11/27/2021 1526   VLDL 57 (H) 11/27/2021 1526   LDLCALC 124 (H) 11/27/2021 1526   LDLCALC 87 03/23/2021 1311    Physical Exam:    VS:  BP 138/84 (BP Location: Right Arm, Patient Position: Sitting, Cuff Size: Normal)   Pulse 76   Ht '5\' 3"'$  (1.6 m)   Wt 151 lb 6.4 oz (68.7 kg)   SpO2 94%   BMI 26.82 kg/m     Wt Readings from Last 3 Encounters:  09/11/22 151 lb 6.4 oz (68.7 kg)  02/22/22 148 lb (67.1 kg)  11/28/21 154 lb 5.2 oz (70 kg)     GEN:  Well nourished, well developed in no acute distress HEENT: Normal NECK: No JVD; No carotid bruits LYMPHATICS: No lymphadenopathy CARDIAC: RRR, no murmurs, rubs, gallops RESPIRATORY:  Clear to auscultation without rales, wheezing or rhonchi  ABDOMEN: Soft, non-tender, non-distended MUSCULOSKELETAL:  No edema; No deformity   SKIN: Warm and dry NEUROLOGIC:  Alert and oriented x 3 PSYCHIATRIC:  Normal affect    Signed, Shirlee More, MD  09/11/2022 2:24 PM    [place]

## 2022-09-11 ENCOUNTER — Encounter: Payer: Self-pay | Admitting: Cardiology

## 2022-09-11 ENCOUNTER — Ambulatory Visit: Payer: Medicare Other | Attending: Cardiology | Admitting: Cardiology

## 2022-09-11 VITALS — BP 138/84 | HR 76 | Ht 63.0 in | Wt 151.4 lb

## 2022-09-11 DIAGNOSIS — E782 Mixed hyperlipidemia: Secondary | ICD-10-CM

## 2022-09-11 DIAGNOSIS — R5383 Other fatigue: Secondary | ICD-10-CM | POA: Diagnosis not present

## 2022-09-11 DIAGNOSIS — I2510 Atherosclerotic heart disease of native coronary artery without angina pectoris: Secondary | ICD-10-CM

## 2022-09-11 DIAGNOSIS — I10 Essential (primary) hypertension: Secondary | ICD-10-CM

## 2022-09-11 NOTE — Addendum Note (Signed)
Addended by: Edwyna Shell I on: 09/11/2022 02:37 PM   Modules accepted: Orders

## 2022-09-11 NOTE — Addendum Note (Signed)
Addended by: Edwyna Shell I on: 09/11/2022 02:35 PM   Modules accepted: Orders

## 2022-09-11 NOTE — Patient Instructions (Signed)
Medication Instructions:  Your physician recommends that you continue on your current medications as directed. Please refer to the Current Medication list given to you today.  *If you need a refill on your cardiac medications before your next appointment, please call your pharmacy*   Lab Work: Your physician recommends that you return for lab work in:   Labs today: CBC, CMP, TSH, Lipids  If you have labs (blood work) drawn today and your tests are completely normal, you will receive your results only by: [place] (if you have MyChart) OR A paper copy in the mail If you have any lab test that is abnormal or we need to change your treatment, we will call you to review the results.   Testing/Procedures:   [facility] [address] [place], [place] [address] Phone:  [phone]    Please arrive 15 minutes prior to your appointment time for registration and insurance purposes.  The test will take approximately 3 to 4 hours to complete; you may bring reading material.  If someone comes with you to your appointment, they will need to remain in the main lobby due to limited space in the testing area. **If you are pregnant or breastfeeding, please notify the nuclear lab prior to your appointment**  How to prepare for your Myocardial Perfusion Test: Do not eat or drink 3 hours prior to your test, except you may have water. Do not consume products containing caffeine (regular or decaffeinated) 12 hours prior to your test. (ex: coffee, chocolate, sodas, tea). Do bring a list of your current medications with you.  If not listed below, you may take your medications as normal. Do wear comfortable clothes (no dresses or overalls) and walking shoes, tennis shoes preferred (No heels or open toe shoes are allowed). Do NOT wear cologne, perfume, aftershave, or lotions (deodorant is allowed). If these instructions are not followed, your test will have to be  rescheduled.  Please report to [address] for your test.  If you have questions or concerns about your appointment, you can call the [facility] at [phone].  If you cannot keep your appointment, please provide 24 hours notification to the Nuclear Lab, to avoid a possible $50 charge to your account.    Follow-Up: At [facility], you and your health needs are our priority.  As part of our continuing mission to provide you with exceptional heart care, we have created designated Provider Care Teams.  These Care Teams include your primary Cardiologist (physician) and Advanced Practice Providers (APPs -  Physician Assistants and Nurse Practitioners) who all work together to provide you with the care you need, when you need it.  We recommend signing up for the patient portal called "MyChart".  Sign up information is provided on this After Visit Summary.  MyChart is used to connect with patients for Virtual Visits (Telemedicine).  Patients are able to view lab/test results, encounter notes, upcoming appointments, etc.  Non-urgent messages can be sent to your provider as well.   To learn more about what you can do with MyChart, go to [email or web address].    Your next appointment:   6 month(s)  Provider:   Shirlee More, MD    Other Instructions None

## 2022-09-12 ENCOUNTER — Telehealth (HOSPITAL_COMMUNITY): Payer: Self-pay | Admitting: *Deleted

## 2022-09-12 LAB — COMPREHENSIVE METABOLIC PANEL
ALT: 17 IU/L (ref 0–32)
AST: 29 IU/L (ref 0–40)
Albumin/Globulin Ratio: 1.9 (ref 1.2–2.2)
Albumin: 4 g/dL (ref 3.8–4.8)
Alkaline Phosphatase: 111 IU/L (ref 44–121)
BUN/Creatinine Ratio: 25 (ref 12–28)
BUN: 18 mg/dL (ref 8–27)
Bilirubin Total: 0.3 mg/dL (ref 0.0–1.2)
CO2: 26 mmol/L (ref 20–29)
Calcium: 9 mg/dL (ref 8.7–10.3)
Chloride: 101 mmol/L (ref 96–106)
Creatinine, Ser: 0.73 mg/dL (ref 0.57–1.00)
Globulin, Total: 2.1 g/dL (ref 1.5–4.5)
Glucose: 80 mg/dL (ref 70–99)
Potassium: 4.4 mmol/L (ref 3.5–5.2)
Sodium: 141 mmol/L (ref 134–144)
Total Protein: 6.1 g/dL (ref 6.0–8.5)
eGFR: 85 mL/min/{1.73_m2} (ref >59)

## 2022-09-12 LAB — LIPID PANEL
Chol/HDL Ratio: 4.7 ratio — ABNORMAL HIGH (ref 0.0–4.4)
Cholesterol, Total: 236 mg/dL — ABNORMAL HIGH (ref 100–199)
HDL: 50 mg/dL (ref >39)
LDL Chol Calc (NIH): 152 mg/dL — ABNORMAL HIGH (ref 0–99)
Triglycerides: 190 mg/dL — ABNORMAL HIGH (ref 0–149)
VLDL Cholesterol Cal: 34 mg/dL (ref 5–40)

## 2022-09-12 LAB — CBC
Hematocrit: 40.6 % (ref 34.0–46.6)
Hemoglobin: 13.5 g/dL (ref 11.1–15.9)
MCH: 30 pg (ref 26.6–33.0)
MCHC: 33.3 g/dL (ref 31.5–35.7)
MCV: 90 fL (ref 79–97)
Platelets: 313 10*3/uL (ref 150–450)
RBC: 4.5 x10E6/uL (ref 3.77–5.28)
RDW: 12.5 % (ref 11.7–15.4)
WBC: 8.4 10*3/uL (ref 3.4–10.8)

## 2022-09-12 LAB — TSH: TSH: 2.99 u[IU]/mL (ref 0.450–4.500)

## 2022-09-12 NOTE — Telephone Encounter (Signed)
Left message on voicemail per DPR in reference to upcoming appointment scheduled on  09/13/22 with detailed instructions given per Myocardial Perfusion Study Information Sheet for the test. LM to arrive 15 minutes early, and that it is imperative to arrive on time for appointment to keep from having the test rescheduled. If you need to cancel or reschedule your appointment, please call the office within 24 hours of your appointment. Failure to do so may result in a cancellation of your appointment, and a $50 no show fee. Phone number given for call back for any questions. Kirstie Peri

## 2022-09-13 ENCOUNTER — Other Ambulatory Visit: Payer: Self-pay

## 2022-09-13 ENCOUNTER — Ambulatory Visit: Payer: Medicare Other | Attending: Cardiology

## 2022-09-13 ENCOUNTER — Telehealth: Payer: Self-pay | Admitting: Cardiology

## 2022-09-13 DIAGNOSIS — R5383 Other fatigue: Secondary | ICD-10-CM

## 2022-09-13 DIAGNOSIS — I2510 Atherosclerotic heart disease of native coronary artery without angina pectoris: Secondary | ICD-10-CM | POA: Diagnosis not present

## 2022-09-13 DIAGNOSIS — I10 Essential (primary) hypertension: Secondary | ICD-10-CM | POA: Diagnosis not present

## 2022-09-13 DIAGNOSIS — E782 Mixed hyperlipidemia: Secondary | ICD-10-CM | POA: Diagnosis not present

## 2022-09-13 MED ORDER — ISOSORBIDE MONONITRATE ER 60 MG PO TB24: 60.0000 mg | ORAL_TABLET | Freq: Every day | ORAL | 3 refills | Status: DC

## 2022-09-13 MED ORDER — TECHNETIUM TC 99M TETROFOSMIN IV KIT
10.6000 | PACK | Freq: Once | INTRAVENOUS | Status: AC | PRN
  Administered 2022-09-13: 10.6 via INTRAVENOUS

## 2022-09-13 MED ORDER — REGADENOSON 0.4 MG/5ML IV SOLN
0.4000 mg | Freq: Once | INTRAVENOUS | Status: AC
  Administered 2022-09-13: 0.4 mg via INTRAVENOUS

## 2022-09-13 MED ORDER — TECHNETIUM TC 99M TETROFOSMIN IV KIT
32.7000 | PACK | Freq: Once | INTRAVENOUS | Status: AC | PRN
  Administered 2022-09-13: 32.7 via INTRAVENOUS

## 2022-09-13 MED ORDER — BEMPEDOIC ACID 180 MG PO TABS: 180.0000 mg | ORAL_TABLET | Freq: Every day | ORAL | 3 refills | Status: DC

## 2022-09-13 NOTE — Telephone Encounter (Signed)
Patient informed of results.  

## 2022-09-13 NOTE — Telephone Encounter (Signed)
LVM to call regarding message.

## 2022-09-13 NOTE — Telephone Encounter (Signed)
Patient was returning call for results. Please advise  

## 2022-09-14 ENCOUNTER — Telehealth: Payer: Self-pay | Admitting: Cardiology

## 2022-09-14 ENCOUNTER — Telehealth: Payer: Self-pay

## 2022-09-14 LAB — MYOCARDIAL PERFUSION IMAGING
LV dias vol: 58 mL (ref 46–106)
LV sys vol: 19 mL
Nuc Stress EF: 67 %
Peak HR: 96 {beats}/min
Rest HR: 69 {beats}/min
Rest Nuclear Isotope Dose: 10.6 mCi
SDS: 6
SRS: 1
SSS: 7
Stress Nuclear Isotope Dose: 32.7 mCi
TID: 0.79

## 2022-09-14 NOTE — Telephone Encounter (Signed)
Spoke with pt about Brandy Cervantes results- per Dr. Joya Gaskins note. Pt verbalized understanding and had no further questions. Routed to PCP.

## 2022-09-14 NOTE — Telephone Encounter (Signed)
Patient returned RN's call regarding results. 

## 2022-09-18 NOTE — Addendum Note (Signed)
Addended by: Shirlee More on: 09/18/2022 03:52 PM   Modules accepted: Orders

## 2022-09-19 ENCOUNTER — Telehealth: Payer: Self-pay

## 2022-09-19 NOTE — Telephone Encounter (Signed)
Prior authorization submitted with CMM, outcome pending  Brandy Cervantes (Key: [id number]) PA Case ID #: [id number] Rx #: [id number] Need Help? Call us at [phone] Status Sent to Plan today Drug Nexletol '180MG'$  tablets

## 2022-09-19 NOTE — Telephone Encounter (Signed)
Approved today Request Reference Number: [id number]. NEXLETOL TAB '180MG'$  is approved through 07/17/2023. Your patient may now fill this prescription and it will be covered. Authorization Expiration Date: 07/17/2023  Patient informed of approval

## 2022-11-15 IMAGING — MR MR HEAD W/O CM
12 of 13 series · 44 of 48 positions shown · non-contrast
Comparison: Head MRI 01/27/2020

CLINICAL DATA: Transient ischemic attack (TIA).  Amaurosis fugax?.

EXAM:
MRI HEAD WITHOUT CONTRAST
TECHNIQUE: Multiplanar, multiecho pulse sequences of the brain and surrounding
structures were obtained without intravenous contrast.

[Series 5: DWI · axial · 3.0mm · 0.88mm/px · z∈[-102,+45]mm · 8 of 100 slices shown (1 of 4)]
[im 1/100]
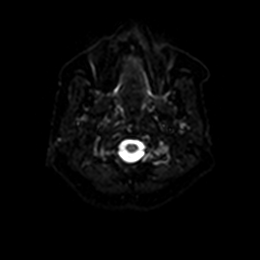
[im 15/100]
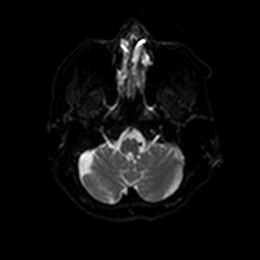
[im 29/100]
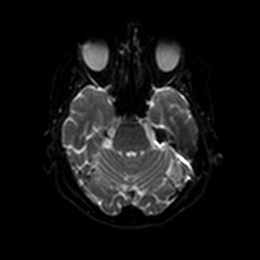
[im 43/100]
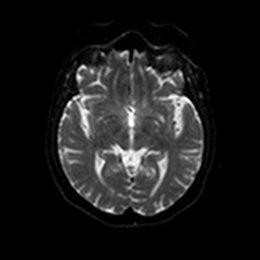
[im 57/100]
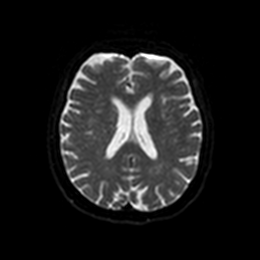
[im 71/100]
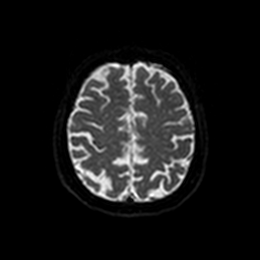
[im 85/100]
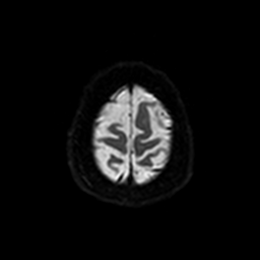
[im 100/100]
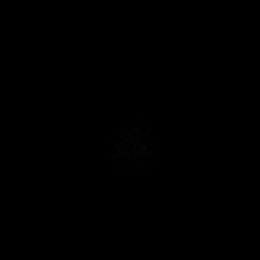

[Series 6: DWI · axial · 3.0mm · 0.88mm/px · z∈[-102,+45]mm · 4 of 50 slices shown (2 of 4)]
[im 1/50]
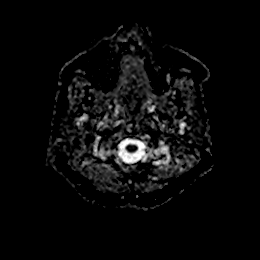
[im 17/50]
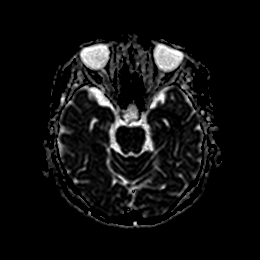
[im 33/50]
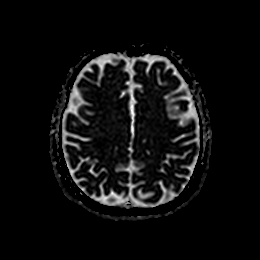
[im 50/50]
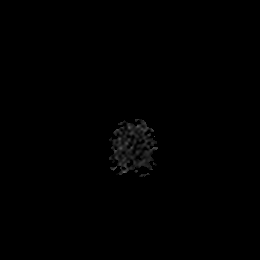

[Series 7: DWI · coronal · 4.0mm · 0.88mm/px · 5 of 66 slices shown (3 of 4)]
[im 1/66]
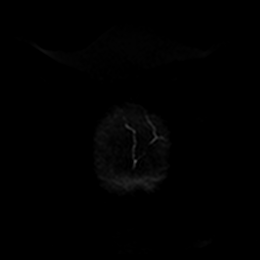
[im 17/66]
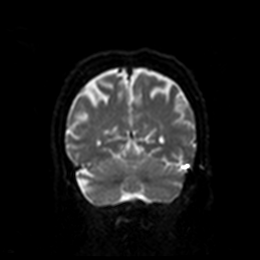
[im 33/66]
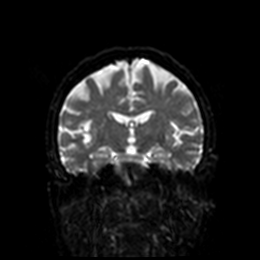
[im 49/66]
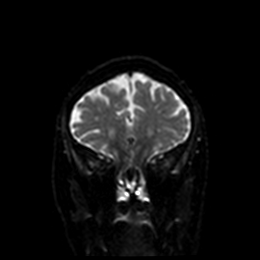
[im 66/66]
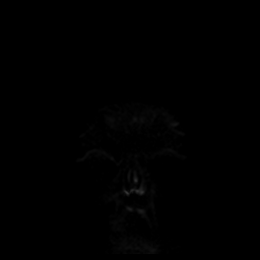

[Series 8: DWI · coronal · 4.0mm · 0.88mm/px · 3 of 32 slices shown (4 of 4)]
[im 1/32]
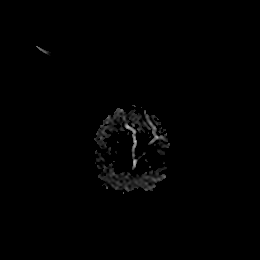
[im 16/32]
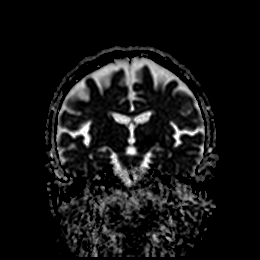
[im 32/32]
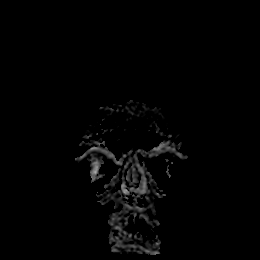

[Series 9: T1 · sagittal · 5.0mm · 0.75mm/px · 2 of 23 slices shown]
[im 1/23]
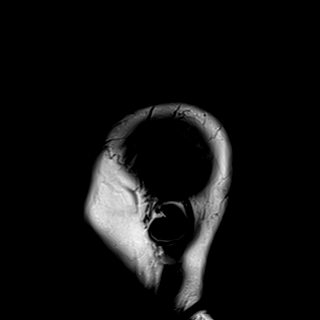
[im 23/23]
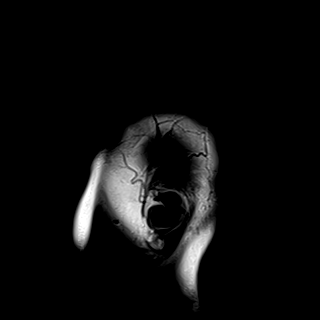

[Series 10: T2 · axial · 5.0mm · 0.72mm/px · z∈[-103,+47]mm · 2 of 26 slices shown (1 of 2)]
[im 1/26]
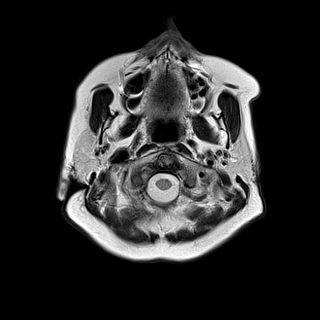
[im 26/26]
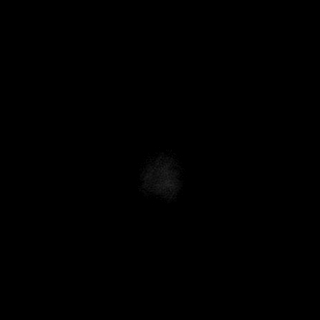

[Series 12: mag_images · axial · 3.0mm · 0.90mm/px · z∈[-104,+49]mm · 4 of 52 slices shown]
[im 1/52]
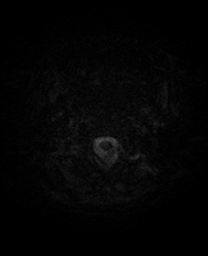
[im 18/52]
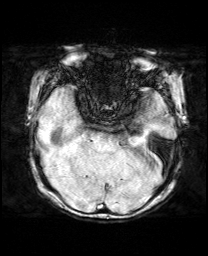
[im 35/52]
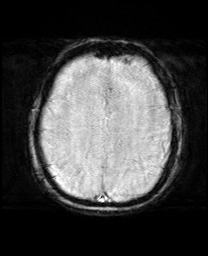
[im 52/52]
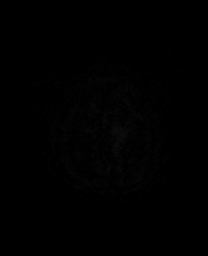

[Series 13: pha_images · axial · 3.0mm · 0.90mm/px · z∈[-104,+46]mm · 4 of 50 slices shown]
[im 1/50]
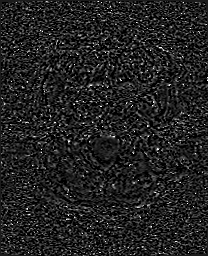
[im 17/50]
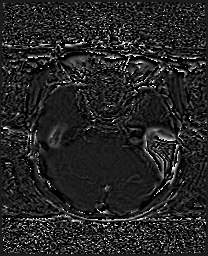
[im 33/50]
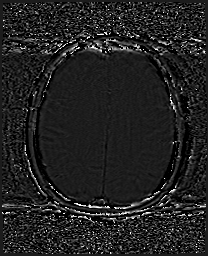
[im 50/50]
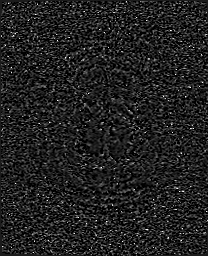

[Series 14: swi_images · axial · 3.0mm · 0.90mm/px · z∈[-104,+49]mm · 4 of 52 slices shown]
[im 1/52]
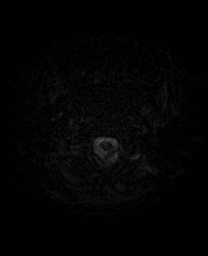
[im 18/52]
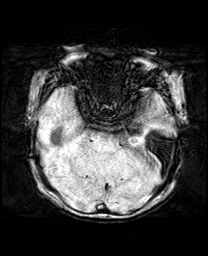
[im 35/52]
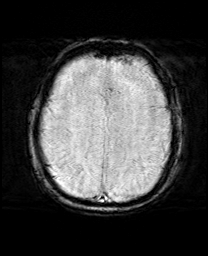
[im 52/52]
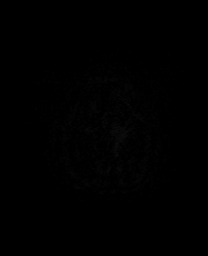

[Series 15: mip_images(sw) · axial · 24.0mm · 0.90mm/px · z∈[-93,+38]mm · 4 of 45 slices shown]
[im 1/45]
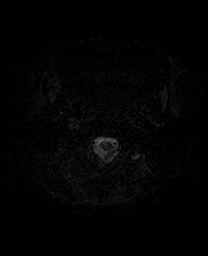
[im 15/45]
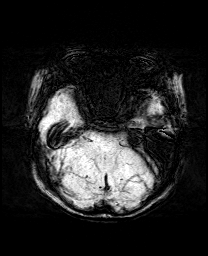
[im 30/45]
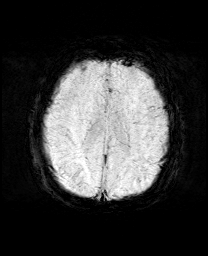
[im 45/45]
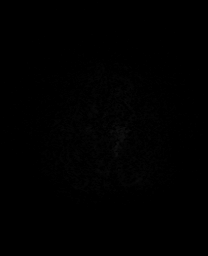

[Series 18: FLAIR · axial · 5.0mm · 0.90mm/px · z∈[-103,+47]mm · 2 of 26 slices shown]
[im 1/26]
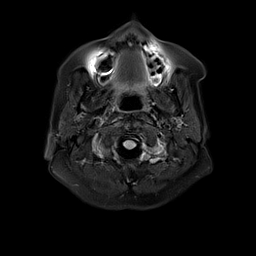
[im 26/26]
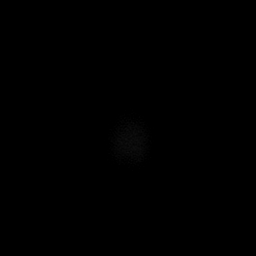

[Series 19: T2 · coronal · 5.0mm · 0.72mm/px · 2 of 26 slices shown (2 of 2)]
[im 1/26]
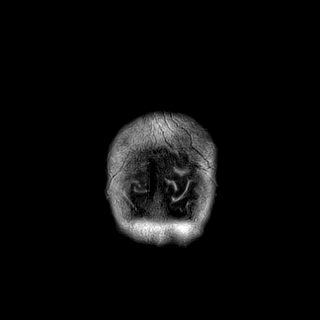
[im 26/26]
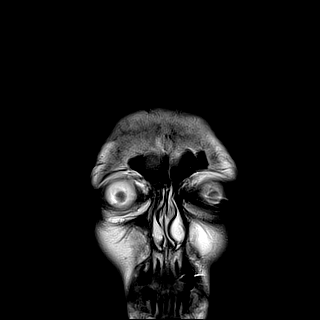

[44 of 48 positions shown; findings below may reference images not displayed]

FINDINGS: Brain: There is no evidence of an acute infarct, intracranial
hemorrhage, mass, midline shift, or extra-axial fluid collection. T2
hyperintensities in the cerebral white matter bilaterally are
similar to the prior MRI and are nonspecific but compatible with
minimal chronic small vessel ischemic disease. There is mild
cerebral and cerebellar atrophy. A partially empty sella is
unchanged.

Vascular: Major intracranial vascular flow voids are preserved.

Skull and upper cervical spine: Unremarkable bone marrow signal.

Sinuses/Orbits: Unremarkable orbits. Paranasal sinuses and mastoid
air cells are clear.

Other: None.
IMPRESSION: 1. No acute intracranial abnormality.
2. Minimal chronic small vessel ischemic disease.

## 2023-01-17 ENCOUNTER — Encounter (INDEPENDENT_AMBULATORY_CARE_PROVIDER_SITE_OTHER): Payer: Medicare Other | Admitting: Ophthalmology

## 2023-01-17 ENCOUNTER — Encounter (INDEPENDENT_AMBULATORY_CARE_PROVIDER_SITE_OTHER): Payer: Self-pay

## 2023-01-17 DIAGNOSIS — H35033 Hypertensive retinopathy, bilateral: Secondary | ICD-10-CM

## 2023-01-17 DIAGNOSIS — H25813 Combined forms of age-related cataract, bilateral: Secondary | ICD-10-CM

## 2023-01-17 DIAGNOSIS — H3530 Unspecified macular degeneration: Secondary | ICD-10-CM

## 2023-01-17 DIAGNOSIS — H43813 Vitreous degeneration, bilateral: Secondary | ICD-10-CM

## 2023-01-17 DIAGNOSIS — I10 Essential (primary) hypertension: Secondary | ICD-10-CM

## 2023-02-15 ENCOUNTER — Other Ambulatory Visit: Payer: Self-pay | Admitting: Cardiology

## 2023-03-05 ENCOUNTER — Other Ambulatory Visit: Payer: Self-pay | Admitting: Cardiology

## 2023-04-04 ENCOUNTER — Encounter: Payer: Self-pay | Admitting: Cardiology

## 2023-04-04 ENCOUNTER — Ambulatory Visit: Payer: Medicare Other | Attending: Cardiology | Admitting: Cardiology

## 2023-04-04 VITALS — BP 122/80 | HR 93 | Ht 62.0 in | Wt 146.0 lb

## 2023-04-04 DIAGNOSIS — E782 Mixed hyperlipidemia: Secondary | ICD-10-CM

## 2023-04-04 DIAGNOSIS — I2510 Atherosclerotic heart disease of native coronary artery without angina pectoris: Secondary | ICD-10-CM

## 2023-04-04 DIAGNOSIS — I10 Essential (primary) hypertension: Secondary | ICD-10-CM | POA: Diagnosis not present

## 2023-04-04 MED ORDER — BEMPEDOIC ACID 180 MG PO TABS: 180.0000 mg | ORAL_TABLET | Freq: Every day | ORAL | 3 refills | Status: DC

## 2023-04-04 MED ORDER — NEXLETOL 180 MG PO TABS: 1.0000 | ORAL_TABLET | Freq: Every day | ORAL | Status: DC

## 2023-04-04 NOTE — Patient Instructions (Signed)
Medication Instructions:   Nexletol 180mg  1 tablet daily   Lab Work: Your physician recommends that you return for lab work in: 2 months You need to have labs done when you are fasting.  You can come Monday through Friday 8:30 am to 12:00 pm and 1:15 to 4:30. You do not need to make an appointment as the order has already been placed. The labs you are going to have done are Lpa, CMP Lipids.    Testing/Procedures: None Ordered   Follow-Up: At [facility], you and your health needs are our priority.  As part of our continuing mission to provide you with exceptional heart care, we have created designated Provider Care Teams.  These Care Teams include your primary Cardiologist (physician) and Advanced Practice Providers (APPs -  Physician Assistants and Nurse Practitioners) who all work together to provide you with the care you need, when you need it.  We recommend signing up for the patient portal called "MyChart".  Sign up information is provided on this After Visit Summary.  MyChart is used to connect with patients for Virtual Visits (Telemedicine).  Patients are able to view lab/test results, encounter notes, upcoming appointments, etc.  Non-urgent messages can be sent to your provider as well.   To learn more about what you can do with MyChart, go to [email or web address].    Your next appointment:   6 month(s)  The format for your next appointment:   In Person  Provider:   Norman Herrlich, MD    Other Instructions NA

## 2023-04-04 NOTE — Addendum Note (Signed)
Addended by: Baldo Ash D on: 04/04/2023 05:29 PM   Modules accepted: Orders

## 2023-04-04 NOTE — Progress Notes (Signed)
Cardiology Office Note:    Date:  04/04/2023   ID:  Brandy Cervantes, DOB 1946-06-01, MRN [id number]  PCP:  Delmer Islam, NP-C  Cardiologist:  Norman Herrlich, MD    Referring MD: Delmer Islam, NP-C    ASSESSMENT:    1. CAD in native artery   2. Mixed hyperlipidemia   3. Essential hypertension    PLAN:    In order of problems listed above:  Stable CAD continue her long-term clopidogrel therapy along with antianginals isosorbide and initiate lipid-lowering therapy bempedoic acid follow-up lipid profile 2 months and LP(a).  Recent myocardial perfusion study reassuring and normal Stable BP at target   Next appointment: 6 months   Medication Adjustments/Labs and Tests Ordered: Current medicines are reviewed at length with the patient today.  Concerns regarding medicines are outlined above.  No orders of the defined types were placed in this encounter.  No orders of the defined types were placed in this encounter.    History of Present Illness:    Brandy Cervantes is a 77 y.o. 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  She had follow-up coronary angiography performed in March 2022 with patent stent to the LAD and mild nonflow flow-limiting stenosis right coronary artery last seen 09/11/2022. Minimal mild cardial perfusion study reported 09/13/2022 normal perfusion normal function.  Compliance with diet, lifestyle and medications: No does not appear as if she ever started taking bempedoic acid and after reviewing with her her lipid profile that shows severe hyperlipidemia elevation of cholesterol total 234 LDL 151 non-HDL cholesterol 186 she agrees to start lipid-lowering therapy Has multiple stressors in her life He is not having angina shortness of breath palpitation or syncope Past Medical History:  Diagnosis Date   Atrophic flaccid tympanic membrane 10/18/2015   Bilateral  primary osteoarthritis of knee 11/17/2016   Bronchiectasis without complication (HCC) 07/30/2019   Formatting of this note might be different from the original. Chest CT 06/20/19   Chest pain 03/03/2018   Chronic pain 10/18/2015   Corns of multiple toes 09/20/2018   Coronary artery disease involving native coronary artery of native heart with angina pectoris (HCC) 02/04/2018   Degenerative disc disease, cervical 10/28/2013   Degenerative disc disease, lumbar 10/28/2013   Depression    Esophageal dysphagia 12/31/2017   Essential hypertension 10/18/2015   Exposure to TB    "my mother died when she was 32 of TB"   Forgetfulness 12/22/2019   GAD (generalized anxiety disorder) 10/18/2015   GERD (gastroesophageal reflux disease) 12/31/2017   History of angina 01/27/2018   History of gout    "I've had it occasionally" (11/23/2016)   Hypotension 07/24/2018   Insomnia 10/18/2015   Low back pain 10/28/2013   Migraine    "a few/year; more in the last few weeks" (01/29/2018)   Mixed hyperlipidemia 10/19/2017   Ocular migraine    Osteoarthritis 10/18/2015   Osteoporosis 10/18/2015   Other chest pain 12/31/2017   Pain syndrome, chronic 10/28/2013   Presence of drug coated stent in LAD coronary artery 02/07/2018   Progressive angina (HCC)    Raynaud's disease    feet   Recurrent major depressive disorder, in remission (HCC) 01/08/2020   Screening for cervical cancer 04/11/2021   Formatting of this note might be different from the original. 04/11/2021: declined further testing, discussed   Screening for colon cancer 04/11/2021   Formatting of this note might be  different from the original. 04/11/2021: declined further testing, discussed   TIA (transient ischemic attack) 11/27/2021   Unstable angina (HCC) 01/27/2018   Vaccination declined by patient 04/11/2021   Formatting of this note might be different from the original. 04/11/2021: all, discussed   Vision disturbance    Visual disturbances 11/28/2021     Current Medications: Current Meds  Medication Sig   albuterol (VENTOLIN HFA) 108 (90 Base) MCG/ACT inhaler Inhale 2 puffs into the lungs every 6 (six) hours as needed for wheezing or shortness of breath.   Ascorbic Acid (VITAMIN C) 1000 MG tablet Take 1,000 mg by mouth daily at 12 noon.   b complex vitamins capsule Take 1 capsule by mouth daily at 12 noon. Super   Bempedoic Acid 180 MG TABS Take 1 tablet (180 mg total) by mouth daily.   Cholecalciferol (VITAMIN D3) 1000 units CAPS Take 1,000 Units by mouth daily.   clopidogrel (PLAVIX) 75 MG tablet Take 1 tablet (75 mg total) by mouth daily.   CYMBALTA 60 MG capsule Take 60 mg by mouth daily.   Docusate Sodium (DSS) 100 MG CAPS Take 100 mg by mouth as needed for constipation.   gabapentin (NEURONTIN) 100 MG capsule Take 100 mg by mouth daily.   HYDROcodone-acetaminophen (NORCO) 10-325 MG tablet Take 1 tablet by mouth every 6 (six) hours as needed for moderate pain.   hydrOXYzine (ATARAX/VISTARIL) 25 MG tablet Take 25 mg by mouth See admin instructions. Take 1 in the morning, 1 around lunch time, and 2 at night   isosorbide mononitrate (IMDUR) 60 MG 24 hr tablet Take 1 tablet (60 mg total) by mouth daily.   linaclotide (LINZESS) 72 MCG capsule Take 72 mcg by mouth daily before breakfast.   morphine (MS CONTIN) 30 MG 12 hr tablet Take 30 mg by mouth every 12 (twelve) hours.   Multiple Vitamins-Minerals (PRESERVISION AREDS 2) CAPS Take 1 tablet by mouth daily at 12 noon.   Multiple Vitamins-Minerals (SENIOR MULTIVITAMIN PLUS) TABS Take 1 tablet by mouth daily at 12 noon. With Iron   nitroGLYCERIN (NITROSTAT) 0.4 MG SL tablet Place 1 tablet (0.4 mg total) under the tongue every 5 (five) minutes as needed for chest pain.   OVER THE COUNTER MEDICATION Take 4-5 tablets by mouth at bedtime as needed (for sleep). Calms Forte   oxymetazoline (AFRIN) 0.05 % nasal spray Place 1 spray into both nostrils at bedtime.   pantoprazole (PROTONIX) 40 MG  tablet Take 1 tablet (40 mg total) by mouth daily.      EKGs/Labs/Other Studies Reviewed:    The following studies were reviewed today:  Cardiac Studies & Procedures   CARDIAC CATHETERIZATION  CARDIAC CATHETERIZATION 10/08/2020  Narrative  The left ventricular systolic function is normal. The left ventricular ejection fraction is 55-65% by visual estimate.  LV end diastolic pressure is normal.  There is no aortic valve stenosis.  Prox LAD stent was widely patent.  Mid LAD lesion is 20% stenosed.  Prox RCA lesion is 20% stenosed.  Mid RCA lesion is 55% stenosed ->  SUMMARY  Widely patent LAD stent  Mild progression of disease in the mid RCA now more like 50-55% as opposed to 30% on last cath -> not flow-limiting.  Would not cause resting chest pain.  Normal LVEF and EDP.   RECOMMENDATIONS  Optimize medical management.  Consider prolonged coronary spasm, although there is no evidence to suggest spasm on cath.     Bryan Lemma, MD  Findings Coronary Findings Diagnostic  Dominance: Right  Left Anterior Descending Non-stenotic Prox LAD lesion was previously treated. The lesion is located at the major branch. Mid LAD lesion is 20% stenosed. The lesion is eccentric.  First Diagonal Branch Vessel is small in size.  Right Coronary Artery Prox RCA lesion is 20% stenosed. The lesion is focal and eccentric. Mid RCA lesion is 55% stenosed. The lesion is focal and eccentric. This lesion was previously described as being somewhat responsive to nitroglycerin.  Intervention  No interventions have been documented.   CARDIAC CATHETERIZATION  CARDIAC CATHETERIZATION 03/04/2018  Narrative Conclusions: 1. Mild non-obstructive coronary artery disease.  An element of vasospasm is suspected given improvement in some lesions with intracoronary nitroglycerin. 2. Widely patent proximal LAD stent, unchanged in appearance from 01/29/18. 3. Low LVEDP with normal  LVEF.  Recommendations: 1. Increase isosorbide mononitrate to 60 mg daily.  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 2. Complete at least 12 months of DAPT from time of PCI last month. 3. Could consider discharge home this afternoon if the patient is able to ambulate without further chest pain.  Recommend uninterrupted dual antiplatelet therapy with Aspirin 81mg  daily and Ticagrelor 90mg  twice daily for a minimum of 12 months (ACS - Class I recommendation).  [name], MD [facility] Pager: [phone]  Findings Coronary Findings Diagnostic  Dominance: Right  Left Anterior Descending Previously placed Prox LAD stent (unknown type) is widely patent. Mid LAD lesion is 20% stenosed. The lesion is eccentric.  First Diagonal Branch Vessel is small in size.  Right Coronary Artery Prox RCA lesion is 20% stenosed. The lesion is focal and eccentric. Mid RCA lesion is 30% stenosed. Lesion became more apparent following administration of intracoronary nitroglycerin with overall vessel size increasing with nitroglycerin.  Residual stenosis may reflect continued spams versus fixed stenosis.  Intervention  No interventions have been documented.   STRESS TESTS  MYOCARDIAL PERFUSION IMAGING 09/13/2022  Narrative   The study is normal. The study is low risk.   Left ventricular function is normal. Nuclear stress EF: 67 %. The left ventricular ejection fraction is hyperdynamic (>65%). End diastolic cavity size is normal.   Prior study not available for comparison.   ECHOCARDIOGRAM  ECHOCARDIOGRAM COMPLETE 02/13/2019  Narrative ECHOCARDIOGRAM REPORT    Patient Name:   BRAYLON GILLE Date of Exam: 02/13/2019 Medical Rec #:  [id number]     Height:       63.5 in Accession #:    [id number]    Weight:       141.8 lb Date of Birth:  21-Apr-1946     BSA:          1.68 m Patient Age:    73 years      BP:           114/78 mmHg Patient Gender: F              HR:           74 bpm. Exam Location:  [facility]   Procedure: 2D Echo  Indications:    Dyspnea 786.09 / R06.00  History:        Patient has no prior history of Echocardiogram examinations. CAD Pacemaker Signs/Symptoms: Chest Pain Risk Factors: Dyslipidemia.  Sonographer:    Louie Boston Referring Phys: [phone] Porsche Noguchi J Maryana Pittmon  IMPRESSIONS   1. The left ventricle has normal systolic function, with an ejection fraction of 55-60%. The cavity size was normal. There is concentric left ventricular hypertrophy. Left ventricular diastolic  Doppler parameters are consistent with impaired relaxation. No evidence of left ventricular regional wall motion abnormalities. 2. The right ventricle has normal systolic function. The cavity was normal. There is no increase in right ventricular wall thickness. 3. The mitral valve is degenerative. There is mild mitral annular calcification present. No evidence of mitral valve stenosis. 4. The aortic valve is tricuspid. No stenosis of the aortic valve. 5. The aorta is normal in size and structure. 6. The aortic root and ascending aorta are normal in size and structure.  FINDINGS Left Ventricle: The left ventricle has normal systolic function, with an ejection fraction of 55-60%. The cavity size was normal. There is concentric left ventricular hypertrophy. Left ventricular diastolic Doppler parameters are consistent with impaired relaxation. Normal left ventricular filling pressures No evidence of left ventricular regional wall motion abnormalities..  Right Ventricle: The right ventricle has normal systolic function. The cavity was normal. There is no increase in right ventricular wall thickness.  Left Atrium: Left atrial size was normal in size.  Right Atrium: Right atrial size was normal in size. Right atrial pressure is estimated at 3 mmHg.  Interatrial Septum: No atrial level shunt detected by color flow Doppler.  Pericardium: There is no evidence of  pericardial effusion.  Mitral Valve: The mitral valve is degenerative in appearance. There is mild mitral annular calcification present. Mitral valve regurgitation is not visualized by color flow Doppler. No evidence of mitral valve stenosis.  Tricuspid Valve: The tricuspid valve is normal in structure. Tricuspid valve regurgitation is mild by color flow Doppler.  Aortic Valve: The aortic valve is tricuspid Aortic valve regurgitation was not visualized by color flow Doppler. There is No stenosis of the aortic valve.  Pulmonic Valve: The pulmonic valve was normal in structure. Pulmonic valve regurgitation is not visualized by color flow Doppler. No evidence of pulmonic stenosis.  Aorta: The aortic root and ascending aorta are normal in size and structure. The aorta is normal in size and structure.  Venous: The inferior vena cava measures 1.50 cm, is normal in size with greater than 50% respiratory variability.   +--------------+--------++ LEFT VENTRICLE         +----------------+---------++ +--------------+--------++ Diastology                PLAX 2D                +----------------+---------++ +--------------+--------++ LV e' lateral:  6.74 cm/s LVIDd:        4.60 cm  +----------------+---------++ +--------------+--------++ LV E/e' lateral:8.1       LVIDs:        2.60 cm  +----------------+---------++ +--------------+--------++ LV e' medial:   4.24 cm/s LV PW:        1.10 cm  +----------------+---------++ +--------------+--------++ LV E/e' medial: 12.9      LV IVS:       1.10 cm  +----------------+---------++ +--------------+--------++ LVOT diam:    1.90 cm  +--------------+--------++ LV SV:        73 ml    +--------------+--------++ LV SV Index:  42.57    +--------------+--------++ LVOT Area:    2.84 cm +--------------+--------++                         +--------------+--------++  +---------------+---------++ RIGHT VENTRICLE          +---------------+---------++ RV S prime:    9.25 cm/s +---------------+---------++ TAPSE (M-mode):1.3 cm    +---------------+---------++  +---------------+-------++-----------++ LEFT ATRIUM  Index       +---------------+-------++-----------++ LA diam:       3.50 cm2.08 cm/m  +---------------+-------++-----------++ LA Vol (A2C):  50.0 ml29.75 ml/m +---------------+-------++-----------++ LA Vol (A4C):  39.6 ml23.56 ml/m +---------------+-------++-----------++ LA Biplane Vol:44.4 ml26.42 ml/m +---------------+-------++-----------++ +------------+---------++-----------++ RIGHT ATRIUM         Index       +------------+---------++-----------++ RA Area:    12.50 cm            +------------+---------++-----------++ RA Volume:  26.40 ml 15.71 ml/m +------------+---------++-----------++ +------------+-----------++ AORTIC VALVE            +------------+-----------++ LVOT Vmax:  97.50 cm/s  +------------+-----------++ LVOT Vmean: 67.500 cm/s +------------+-----------++ LVOT VTI:   0.206 m     +------------+-----------++  +-------------+-------++ AORTA                +-------------+-------++ Ao Root diam:3.10 cm +-------------+-------++ Ao Asc diam: 3.10 cm +-------------+-------++  +--------------+----------++ +---------------+-----------++ MITRAL VALVE             TRICUSPID VALVE            +--------------+----------++ +---------------+-----------++ MV Area (PHT):2.66 cm   TR Peak grad:  19.7 mmHg   +--------------+----------++ +---------------+-----------++ MV PHT:       82.65 msec TR Vmax:       225.00 cm/s +--------------+----------++ +---------------+-----------++ MV Decel Time:285 msec   +--------------+----------++  +--------------+-------+ +--------------+----------++ SHUNTS                MV E velocity:54.80 cm/s +--------------+-------+ +--------------+----------++ Systemic VTI: 0.21 m  MV A velocity:80.50 cm/s +--------------+-------+ +--------------+----------++ Systemic Diam:1.90 cm MV E/A ratio: 0.68       +--------------+-------+ +--------------+----------++  +---------+-------+ IVC              +---------+-------+ IVC diam:1.50 cm +---------+-------+   Norman Herrlich MD Electronically signed by Norman Herrlich MD Signature Date/Time: 02/13/2019/4:17:05 PM    Final                 Recent Labs: 09/11/2022: ALT 17; BUN 18; Creatinine, Ser 0.73; Hemoglobin 13.5; Platelets 313; Potassium 4.4; Sodium 141; TSH 2.990  Recent Lipid Panel    Component Value Date/Time   CHOL 236 (H) 09/11/2022 1443   TRIG 190 (H) 09/11/2022 1443   HDL 50 09/11/2022 1443   CHOLHDL 4.7 (H) 09/11/2022 1443   CHOLHDL 4.5 11/27/2021 1526   VLDL 57 (H) 11/27/2021 1526   LDLCALC 152 (H) 09/11/2022 1443    Physical Exam:    VS:  BP 122/80 (BP Location: Right Arm, Patient Position: Sitting, Cuff Size: Normal)   Pulse 93   Ht 5\' 2"  (1.575 m)   Wt 146 lb (66.2 kg)   SpO2 96%   BMI 26.70 kg/m     Wt Readings from Last 3 Encounters:  04/04/23 146 lb (66.2 kg)  09/13/22 151 lb (68.5 kg)  09/11/22 151 lb 6.4 oz (68.7 kg)     GEN:  Well nourished, well developed in no acute distress HEENT: Normal NECK: No JVD; No carotid bruits LYMPHATICS: No lymphadenopathy CARDIAC: RRR, no murmurs, rubs, gallops RESPIRATORY:  Clear to auscultation without rales, wheezing or rhonchi  ABDOMEN: Soft, non-tender, non-distended MUSCULOSKELETAL:  No edema; No deformity  SKIN: Warm and dry NEUROLOGIC:  Alert and oriented x 3 PSYCHIATRIC:  Normal affect    Signed, Norman Herrlich, MD  04/04/2023 4:37 PM    [facility]

## 2023-07-05 ENCOUNTER — Other Ambulatory Visit: Payer: Self-pay | Admitting: Cardiology

## 2023-07-20 ENCOUNTER — Other Ambulatory Visit (HOSPITAL_COMMUNITY): Payer: Self-pay

## 2023-07-20 ENCOUNTER — Telehealth: Payer: Self-pay | Admitting: Pharmacy Technician

## 2023-07-20 NOTE — Telephone Encounter (Signed)
 Nexletol faxed received from [employer].  Previous PA renewed 07/18/23- 07/16/24.  Fax attached in media

## 2023-08-13 ENCOUNTER — Telehealth: Payer: Self-pay | Admitting: Cardiology

## 2023-08-13 ENCOUNTER — Encounter (HOSPITAL_BASED_OUTPATIENT_CLINIC_OR_DEPARTMENT_OTHER): Payer: Self-pay | Admitting: Emergency Medicine

## 2023-08-13 ENCOUNTER — Emergency Department (HOSPITAL_BASED_OUTPATIENT_CLINIC_OR_DEPARTMENT_OTHER): Payer: Medicare Other

## 2023-08-13 ENCOUNTER — Other Ambulatory Visit: Payer: Self-pay

## 2023-08-13 ENCOUNTER — Emergency Department (HOSPITAL_BASED_OUTPATIENT_CLINIC_OR_DEPARTMENT_OTHER)
Admission: EM | Admit: 2023-08-13 | Discharge: 2023-08-13 | Discharge status: Against Medical Advice | Payer: Medicare Other | Attending: Emergency Medicine | Admitting: Emergency Medicine

## 2023-08-13 DIAGNOSIS — Z5321 Procedure and treatment not carried out due to patient leaving prior to being seen by health care provider: Secondary | ICD-10-CM | POA: Insufficient documentation

## 2023-08-13 DIAGNOSIS — R079 Chest pain, unspecified: Secondary | ICD-10-CM | POA: Diagnosis present

## 2023-08-13 DIAGNOSIS — R0609 Other forms of dyspnea: Secondary | ICD-10-CM | POA: Diagnosis not present

## 2023-08-13 LAB — CBC
HCT: 42.9 % (ref 36.0–46.0)
Hemoglobin: 14.5 g/dL (ref 12.0–15.0)
MCH: 30.7 pg (ref 26.0–34.0)
MCHC: 33.8 g/dL (ref 30.0–36.0)
MCV: 90.7 fL (ref 80.0–100.0)
Platelets: 354 10*3/uL (ref 150–400)
RBC: 4.73 MIL/uL (ref 3.87–5.11)
RDW: 12.7 % (ref 11.5–15.5)
WBC: 8.6 10*3/uL (ref 4.0–10.5)
nRBC: 0 % (ref 0.0–0.2)

## 2023-08-13 LAB — BASIC METABOLIC PANEL
Anion gap: 9 (ref 5–15)
BUN: 15 mg/dL (ref 8–23)
CO2: 24 mmol/L (ref 22–32)
Calcium: 9.2 mg/dL (ref 8.9–10.3)
Chloride: 106 mmol/L (ref 98–111)
Creatinine, Ser: 0.55 mg/dL (ref 0.44–1.00)
GFR, Estimated: >60 mL/min (ref >60)
Glucose, Bld: 127 mg/dL — ABNORMAL HIGH (ref 70–99)
Potassium: 4 mmol/L (ref 3.5–5.1)
Sodium: 139 mmol/L (ref 135–145)

## 2023-08-13 LAB — TROPONIN I (HIGH SENSITIVITY): Troponin I (High Sensitivity): 5 ng/L (ref <18)

## 2023-08-13 NOTE — ED Triage Notes (Addendum)
Pt POV steady gait-   Reports chest pain x 3 days, having to use home SL nitroglycerin x 3 days (once/day). Pain resolves after. Chest pain this AM upon waking.  Pt encouraged to be evaluated in ED by PCP. Denies CP, ShOB.   Pt reports dyspnea on exertion x2 weeks.

## 2023-08-13 NOTE — Telephone Encounter (Signed)
   Pt c/o of Chest Pain: STAT if active CP, including tightness, pressure, jaw pain, radiating pain to shoulder/upper arm/back, CP unrelieved by Nitro. Symptoms reported of SOB, nausea, vomiting, sweating.  1. Are you having CP right now? no    2. Are you experiencing any other symptoms (ex. SOB, nausea, vomiting, sweating)? No, but states she does get tired very quickly.    3. Is your CP continuous or coming and going? Comes and goes   4. Have you taken Nitroglycerin? Yes, and the nitroglycerin takes the pain away.    5. How long have you been experiencing CP? For about a week    6. If NO CP at time of call then end call with telling Pt to call back or call 911 if Chest pain returns prior to return call from triage team.

## 2023-08-13 NOTE — Telephone Encounter (Signed)
Called patient and she reported that she had been having chest pain 1 time a month for the past few months and it was able to be relieved with 1 nitroglycerin. Over the past few days she has been getting chest pain under her left breast area which is relieved with 1 nitroglycerin each day. She also has been feeling very tired. She reports no other symptoms at this time. Spoke with Dr. Dulce Sellar regarding the patient's sym,ptoms and he recommended that she go to the ER to be evaluated. Patient verbalized understanding and stated that she would go to the ER. Patient had no further questions at this time.

## 2023-08-14 NOTE — Telephone Encounter (Signed)
Patient called to report to RN Gerlene Burdock that she went to the [facility] ED and they did EKG, blood and vitals tests.  Patient stated she did not wait to be seen but results should be available for review.

## 2023-08-15 NOTE — Telephone Encounter (Signed)
Called patient and informed her of Dr. Hulen Shouts recommendation below:  "Fortunately the blood test for heart attack normal as was the EKG.  We should give her an office appointment in the next week."  Patient verbalized understanding and had no further questions at this time. An appointment was scheduled with the patient to see Wallis Bamberg, NP on 08/20/23 at 3:10 pm per Dr. Dulce Sellar.

## 2023-08-19 ENCOUNTER — Other Ambulatory Visit: Payer: Self-pay | Admitting: Cardiology

## 2023-08-20 ENCOUNTER — Encounter: Payer: Self-pay | Admitting: Cardiology

## 2023-08-20 ENCOUNTER — Ambulatory Visit: Payer: Medicare Other | Attending: Cardiology | Admitting: Cardiology

## 2023-08-20 VITALS — BP 150/90 | HR 78 | Ht 62.0 in | Wt 144.0 lb

## 2023-08-20 DIAGNOSIS — I10 Essential (primary) hypertension: Secondary | ICD-10-CM | POA: Diagnosis not present

## 2023-08-20 DIAGNOSIS — E782 Mixed hyperlipidemia: Secondary | ICD-10-CM | POA: Diagnosis not present

## 2023-08-20 DIAGNOSIS — R0609 Other forms of dyspnea: Secondary | ICD-10-CM

## 2023-08-20 DIAGNOSIS — I2510 Atherosclerotic heart disease of native coronary artery without angina pectoris: Secondary | ICD-10-CM

## 2023-08-20 DIAGNOSIS — Z789 Other specified health status: Secondary | ICD-10-CM

## 2023-08-20 MED ORDER — ISOSORBIDE MONONITRATE ER 120 MG PO TB24: 120.0000 mg | ORAL_TABLET | Freq: Every day | ORAL | 3 refills | Status: AC

## 2023-08-20 NOTE — Patient Instructions (Addendum)
Medication Instructions:    Increase Imdur to 120 mg once a day.  *If you need a refill on your cardiac medications before your next appointment, please call your pharmacy*   Lab Work: None Ordered If you have labs (blood work) drawn today and your tests are completely normal, you will receive your results only by: MyChart Message (if you have MyChart) OR A paper copy in the mail If you have any lab test that is abnormal or we need to change your treatment, we will call you to review the results.   Testing/Procedures: None Ordered   Follow-Up: At [facility], you and your health needs are our priority.  As part of our continuing mission to provide you with exceptional heart care, we have created designated Provider Care Teams.  These Care Teams include your primary Cardiologist (physician) and Advanced Practice Providers (APPs -  Physician Assistants and Nurse Practitioners) who all work together to provide you with the care you need, when you need it.  We recommend signing up for the patient portal called "MyChart".  Sign up information is provided on this After Visit Summary.  MyChart is used to connect with patients for Virtual Visits (Telemedicine).  Patients are able to view lab/test results, encounter notes, upcoming appointments, etc.  Non-urgent messages can be sent to your provider as well.   To learn more about what you can do with MyChart, go to [email or web address].    Your next appointment:   6 month with DR. Munley

## 2023-08-20 NOTE — Progress Notes (Signed)
Cardiology Office Note:  .   Date:  08/20/2023  ID:  Hannie Shoe, DOB 1945/10/11, MRN [id number] PCP: Delmer Islam, NP-C  [facility] Providers Cardiologist:  Norman Herrlich, MD    History of Present Illness: .   Chirstina Haan is a 78 y.o. female with a past medical history of CAD s/p DES pLAD 2019, hypertension, TIA, migraines, bronchiectasis, GERD, dyslipidemia.  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 Dulce Sellar on 04/04/2023, she was stable from a cardiac perspective and advised to follow-up in 6 months.  She contacted our office actually just schedule appointment but she advised them she was having episodes of chest pain, we advised her to present to the emergency department.  Evaluated in the emergency department on 08/13/2023 for chest pain.  Troponins were negative.  Chest x-ray was normal.  She presents today for follow-up of her CAD after recent ED visit as outlined above.  When she recounts her episodes of chest pain, she states that she typically takes 1 nitroglycerin per month however leading up to her ED visit she had to take a nitroglycerin 3 nights in a row.  Her pain was not typical for angina, occurred at rest, however her nitroglycerin did relieve the pain.  She states she was able to haul wood in her house as her husband has not been able to do so recently.  She also walks her dogs for exercise. She denies chest pain, palpitations, dyspnea, pnd, orthopnea, n, v, dizziness, syncope, edema, weight gain, or early satiety.    ROS: Review of Systems  Cardiovascular:  Positive for chest pain.  Musculoskeletal:  Positive for back pain.  All other systems reviewed and are negative.     Studies Reviewed: .        Cardiac Studies & Procedures   CARDIAC CATHETERIZATION  CARDIAC CATHETERIZATION 10/08/2020  Narrative  The left ventricular systolic function is normal. The left ventricular ejection fraction is 55-65% by visual estimate.  LV end diastolic pressure is normal.  There is no aortic valve stenosis.  Prox LAD stent was widely patent.  Mid LAD lesion is 20% stenosed.  Prox RCA lesion is 20% stenosed.  Mid RCA lesion is 55% stenosed ->  SUMMARY  Widely patent LAD stent  Mild progression of disease in the mid RCA now more like 50-55% as opposed to 30% on last cath -> not flow-limiting.  Would not cause resting chest pain.  Normal LVEF and EDP.   RECOMMENDATIONS  Optimize medical management.  Consider prolonged coronary spasm, although there is no evidence to suggest spasm on cath.     Bryan Lemma, MD  Findings Coronary Findings Diagnostic  Dominance: Right  Left Anterior Descending Non-stenotic Prox LAD lesion was previously treated. The lesion is located at the major branch. Mid LAD lesion is 20% stenosed. The lesion is eccentric.  First Diagonal Branch Vessel is small in size.  Right Coronary Artery Prox RCA lesion is 20% stenosed. The lesion is focal and eccentric. Mid RCA lesion is 55% stenosed. The lesion is focal and eccentric. This lesion  was previously described as being somewhat responsive to nitroglycerin.  Intervention  No interventions have been documented.   CARDIAC CATHETERIZATION  CARDIAC CATHETERIZATION 03/04/2018  Narrative Conclusions: 1. Mild non-obstructive coronary artery disease.  An element of vasospasm is suspected given improvement in some lesions with intracoronary nitroglycerin. 2. Widely patent proximal LAD stent, unchanged in  appearance from 01/29/18. 3. Low LVEDP with normal LVEF.  Recommendations: 1. Increase isosorbide mononitrate to 60 mg daily.  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 2. Complete at least 12 months of DAPT from time of PCI last month. 3. Could consider discharge home this afternoon if the patient is able to ambulate without further chest pain.  Recommend uninterrupted dual antiplatelet therapy with Aspirin 81mg  daily and Ticagrelor 90mg  twice daily for a minimum of 12 months (ACS - Class I recommendation).  [name], MD [facility] Pager: [phone]  Findings Coronary Findings Diagnostic  Dominance: Right  Left Anterior Descending Previously placed Prox LAD stent (unknown type) is widely patent. Mid LAD lesion is 20% stenosed. The lesion is eccentric.  First Diagonal Branch Vessel is small in size.  Right Coronary Artery Prox RCA lesion is 20% stenosed. The lesion is focal and eccentric. Mid RCA lesion is 30% stenosed. Lesion became more apparent following administration of intracoronary nitroglycerin with overall vessel size increasing with nitroglycerin.  Residual stenosis may reflect continued spams versus fixed stenosis.  Intervention  No interventions have been documented.   STRESS TESTS  MYOCARDIAL PERFUSION IMAGING 09/13/2022  Narrative   The study is normal. The study is low risk.   Left ventricular function is normal. Nuclear stress EF: 67 %. The left ventricular ejection fraction is hyperdynamic (>65%). End diastolic cavity size is normal.   Prior study not available for comparison.  ECHOCARDIOGRAM  ECHOCARDIOGRAM COMPLETE 02/13/2019  Narrative ECHOCARDIOGRAM REPORT    Patient Name:   DEAUNNA OLARTE Date of Exam: 02/13/2019 Medical Rec #:  [id number]     Height:       63.5 in Accession #:    [id number]    Weight:       141.8 lb Date of Birth:  15-Jan-1946     BSA:          1.68 m Patient Age:    73 years      BP:            114/78 mmHg Patient Gender: F             HR:           74 bpm. Exam Location:  [facility]   Procedure: 2D Echo  Indications:    Dyspnea 786.09 / R06.00  History:        Patient has no prior history of Echocardiogram examinations. CAD Pacemaker Signs/Symptoms: Chest Pain Risk Factors: Dyslipidemia.  Sonographer:    Louie Boston Referring Phys: [phone] BRIAN J MUNLEY  IMPRESSIONS   1. The left ventricle has normal systolic function, with an ejection fraction of 55-60%. The cavity size was normal. There is concentric left ventricular hypertrophy. Left ventricular diastolic Doppler parameters are consistent with impaired relaxation. No evidence of left ventricular regional wall motion abnormalities. 2. The right ventricle has normal systolic function. The cavity was normal. There is no increase in right ventricular wall thickness. 3. The mitral valve is degenerative. There is mild mitral annular calcification present. No evidence of mitral valve stenosis. 4. The aortic valve is tricuspid. No stenosis of the aortic valve. 5. The aorta  is normal in size and structure. 6. The aortic root and ascending aorta are normal in size and structure.  FINDINGS Left Ventricle: The left ventricle has normal systolic function, with an ejection fraction of 55-60%. The cavity size was normal. There is concentric left ventricular hypertrophy. Left ventricular diastolic Doppler parameters are consistent with impaired relaxation. Normal left ventricular filling pressures No evidence of left ventricular regional wall motion abnormalities..  Right Ventricle: The right ventricle has normal systolic function. The cavity was normal. There is no increase in right ventricular wall thickness.  Left Atrium: Left atrial size was normal in size.  Right Atrium: Right atrial size was normal in size. Right atrial pressure is estimated at 3 mmHg.  Interatrial Septum: No atrial level shunt detected by color flow  Doppler.  Pericardium: There is no evidence of pericardial effusion.  Mitral Valve: The mitral valve is degenerative in appearance. There is mild mitral annular calcification present. Mitral valve regurgitation is not visualized by color flow Doppler. No evidence of mitral valve stenosis.  Tricuspid Valve: The tricuspid valve is normal in structure. Tricuspid valve regurgitation is mild by color flow Doppler.  Aortic Valve: The aortic valve is tricuspid Aortic valve regurgitation was not visualized by color flow Doppler. There is No stenosis of the aortic valve.  Pulmonic Valve: The pulmonic valve was normal in structure. Pulmonic valve regurgitation is not visualized by color flow Doppler. No evidence of pulmonic stenosis.  Aorta: The aortic root and ascending aorta are normal in size and structure. The aorta is normal in size and structure.  Venous: The inferior vena cava measures 1.50 cm, is normal in size with greater than 50% respiratory variability.   +--------------+--------++ LEFT VENTRICLE         +----------------+---------++ +--------------+--------++ Diastology                PLAX 2D                +----------------+---------++ +--------------+--------++ LV e' lateral:  6.74 cm/s LVIDd:        4.60 cm  +----------------+---------++ +--------------+--------++ LV E/e' lateral:8.1       LVIDs:        2.60 cm  +----------------+---------++ +--------------+--------++ LV e' medial:   4.24 cm/s LV PW:        1.10 cm  +----------------+---------++ +--------------+--------++ LV E/e' medial: 12.9      LV IVS:       1.10 cm  +----------------+---------++ +--------------+--------++ LVOT diam:    1.90 cm  +--------------+--------++ LV SV:        73 ml    +--------------+--------++ LV SV Index:  42.57    +--------------+--------++ LVOT Area:    2.84 cm +--------------+--------++                         +--------------+--------++  +---------------+---------++ RIGHT VENTRICLE          +---------------+---------++ RV S prime:    9.25 cm/s +---------------+---------++ TAPSE (M-mode):1.3 cm    +---------------+---------++  +---------------+-------++-----------++ LEFT ATRIUM           Index       +---------------+-------++-----------++ LA diam:       3.50 cm2.08 cm/m  +---------------+-------++-----------++ LA Vol (A2C):  50.0 ml29.75 ml/m +---------------+-------++-----------++ LA Vol (A4C):  39.6 ml23.56 ml/m +---------------+-------++-----------++ LA Biplane Vol:44.4 ml26.42 ml/m +---------------+-------++-----------++ +------------+---------++-----------++ RIGHT ATRIUM         Index       +------------+---------++-----------++ RA Area:    12.50  cm            +------------+---------++-----------++ RA Volume:  26.40 ml 15.71 ml/m +------------+---------++-----------++ +------------+-----------++ AORTIC VALVE            +------------+-----------++ LVOT Vmax:  97.50 cm/s  +------------+-----------++ LVOT Vmean: 67.500 cm/s +------------+-----------++ LVOT VTI:   0.206 m     +------------+-----------++  +-------------+-------++ AORTA                +-------------+-------++ Ao Root diam:3.10 cm +-------------+-------++ Ao Asc diam: 3.10 cm +-------------+-------++  +--------------+----------++ +---------------+-----------++ MITRAL VALVE             TRICUSPID VALVE            +--------------+----------++ +---------------+-----------++ MV Area (PHT):2.66 cm   TR Peak grad:  19.7 mmHg   +--------------+----------++ +---------------+-----------++ MV PHT:       82.65 msec TR Vmax:       225.00 cm/s +--------------+----------++ +---------------+-----------++ MV Decel Time:285 msec   +--------------+----------++  +--------------+-------+ +--------------+----------++ SHUNTS                MV E velocity:54.80 cm/s +--------------+-------+ +--------------+----------++ Systemic VTI: 0.21 m  MV A velocity:80.50 cm/s +--------------+-------+ +--------------+----------++ Systemic Diam:1.90 cm MV E/A ratio: 0.68       +--------------+-------+ +--------------+----------++  +---------+-------+ IVC              +---------+-------+ IVC diam:1.50 cm +---------+-------+   Norman Herrlich MD Electronically signed by Norman Herrlich MD Signature Date/Time: 02/13/2019/4:17:05 PM    Final             Risk Assessment/Calculations:     HYPERTENSION CONTROL Vitals:   08/20/23 1457 08/20/23 1608  BP: (!) 160/90 (!) 150/90    The patient's blood pressure is elevated above target today.  In order to address the patient's elevated BP: Blood pressure will be monitored at home to determine if medication changes need to be made.          Physical Exam:   VS:  BP (!) 150/90   Pulse 78   Ht 5\' 2"  (1.575 m)   Wt 144 lb (65.3 kg)   SpO2 96%   BMI 26.34 kg/m    Wt Readings from Last 3 Encounters:  08/20/23 144 lb (65.3 kg)  08/13/23 138 lb (62.6 kg)  04/04/23 146 lb (66.2 kg)    GEN: Well nourished, well developed in no acute distress NECK: No JVD; No carotid bruits CARDIAC: RRR, no murmurs, rubs, gallops RESPIRATORY:  Clear to auscultation without rales, wheezing or rhonchi  ABDOMEN: Soft, non-tender, non-distended EXTREMITIES:  No edema; No deformity   ASSESSMENT AND PLAN: .   CAD-DES to pLAD in 2019 >> ischemic evaluation in 2024 was negative.  Her episodes of chest pain are not consistent with her prior episodes of angina, however were relieved with nitroglycerin.  We discussed at length that she was able to haul wood and walk her dogs without episodes of chest pain which is reassuring.  She is confused how the nitroglycerin would relieve her symptoms of is not related to  her heart.  We discussed increasing her isosorbide and she was agreeable to this.  Will increase her isosorbide to 120 mg daily.  Continue Plavix 75 mg daily, continue nitroglycerin as needed, continue Crestor-as she is only able to tolerate this twice a week.  Hypertension-blood pressure is elevated today at 150/90, she states is typically well-controlled and looking back at her records it appears it typically is.  Will have her keep a  blood pressure log for 2 weeks before we make any changes to her medication regimen.  DOE-this has been going on for some time, she was apparently evaluated by her PCP and sent to pulmonology--is the indication that she has bronchiectasis however she states she was never told she had any lung disorders.  Her DOE could be an anginal equivalent however it sounds to be more consistent with lung disease.  I did offer an ischemic evaluation and a repeat echocardiogram however she is recently started with a new insurance plan and she really does not want any further testing at this time.  Dyslipidemia with statin intolerance-her LDL is not well-controlled at 151 on 04/01/2023, it appears she is only able to take Crestor twice weekly.  It appears she was on bempedoic acid and Nexletol however she is no longer taking either of these.  He to consider LPA and possibly referral to lipid clinic at her next OV.       Dispo: Increase Imdur to 120 mg daily, blood pressure log for 2 weeks, she will notify our office if she continues to be bothered by DOE after she figures out what is going on with her insurance company.  Follow-up in 6 months.  Signed, Flossie Dibble, NP

## 2023-09-20 ENCOUNTER — Other Ambulatory Visit (HOSPITAL_BASED_OUTPATIENT_CLINIC_OR_DEPARTMENT_OTHER): Payer: Self-pay | Admitting: Physical Medicine and Rehabilitation

## 2023-09-20 DIAGNOSIS — R519 Headache, unspecified: Secondary | ICD-10-CM

## 2023-09-27 ENCOUNTER — Ambulatory Visit (HOSPITAL_BASED_OUTPATIENT_CLINIC_OR_DEPARTMENT_OTHER)
Admission: RE | Admit: 2023-09-27 | Discharge: 2023-09-27 | Disposition: A | Discharge status: Home / Self Care | Source: Ambulatory Visit | Attending: Physical Medicine and Rehabilitation | Admitting: Physical Medicine and Rehabilitation

## 2023-09-27 DIAGNOSIS — R519 Headache, unspecified: Secondary | ICD-10-CM

## 2023-09-27 MED ORDER — GADOBUTROL 1 MMOL/ML IV SOLN
6.5000 mL | Freq: Once | INTRAVENOUS | Status: AC | PRN
Start: 2023-09-27 — End: 2023-09-27
  Administered 2023-09-27: 6.5 mL via INTRAVENOUS

## 2023-10-01 ENCOUNTER — Ambulatory Visit: Payer: Medicare Other | Admitting: Cardiology

## 2023-11-18 ENCOUNTER — Other Ambulatory Visit: Payer: Self-pay | Admitting: Cardiology

## 2024-05-08 ENCOUNTER — Other Ambulatory Visit: Payer: Self-pay | Admitting: Cardiology

## 2024-06-16 ENCOUNTER — Other Ambulatory Visit: Payer: Self-pay

## 2024-06-16 ENCOUNTER — Telehealth: Payer: Self-pay | Admitting: Cardiology

## 2024-06-16 MED ORDER — NITROGLYCERIN 0.4 MG SL SUBL: 0.4000 mg | SUBLINGUAL_TABLET | SUBLINGUAL | 1 refills | Status: AC | PRN

## 2024-06-16 NOTE — Telephone Encounter (Signed)
   Pt c/o of Chest Pain: STAT if active CP, including tightness, pressure, jaw pain, radiating pain to shoulder/upper arm/back, CP unrelieved by Nitro. Symptoms reported of SOB, nausea, vomiting, sweating.  1. Are you having CP right now? No     2. Are you experiencing any other symptoms (ex. SOB, nausea, vomiting, sweating)? No    3. Is your CP continuous or coming and going? Coming and going    4. Have you taken Nitroglycerin ? Pt took 3 yesterday 06/15/24   5. How long have you been experiencing CP? Yesterday     6. If NO CP at time of call then end call with telling Pt to call back or call 911 if Chest pain returns prior to return call from triage team.   Pt husband called to request refill on pts nitroglycerin . He stated she was having chest pain yesterday and had to take 3 nitroglycerin . Please advise.

## 2024-06-16 NOTE — Telephone Encounter (Signed)
 Spoke with patient of Dr. Monetta with known CAD. She had chest pain yesterday, strange feelings in her chest - pain under left ribcage. She woke up with this. She took NTGx1 with partial relief and then took a second dose 5 minutes later. Chest pain returned 1 hour later. She reports similar feelings occurred throughout the day. She took a total of 6-7 NTG yesterday.   She did not feel the need to go to ED yesterday, said she will have to go from something and figured it may be her heart.   NTG refilled today. [facility] is out of stock, should be in tomorrow. Advised if they do not have it tomorrow, call back for us  to send elsewhere.   Dr. Monetta has clinic 12/5 -- unsure if she can be added on. She prefers [place] location only.   Routed to MD/RN

## 2024-06-18 NOTE — Telephone Encounter (Signed)
 Called and spoke with patient. Patient states that Sunday morning, she had a sharp pain that felt like it started inside her left ribcage and radiated up and out her left breast, once that subsided every few hours she had a burning sensation all across her chest and up into her neck. She took nitroglycerin  and felt that did relieve the pain some, but the sensation came back every hour or so. States she has not taken any nitroglycerin  yesterday or today, but would like to schedule an appointment with Dr. Monetta. States she was sent a reminder in September to make an appointment, but was unable to do so.  Alan, RN

## 2024-06-20 NOTE — Telephone Encounter (Signed)
 Called the patient and she reported that she was not having her cardiac symptoms like she was having on 06/16/24. She stated that it was just that one day and she hasn't had any symptoms since that day. Reinforced with the patient that if the symptoms return she can always go to the ER. Patient verbalized understanding and had no further questions at this time.

## 2024-06-24 NOTE — Progress Notes (Signed)
 If she was seen, not treated and all symptoms resolved, she doesn't need an ED follow up.    Would you call and tell her?  If she wants to be seen for any other reason, I will have the front office try to get her something.    Thanks

## 2024-07-02 NOTE — Progress Notes (Unsigned)
 Cardiology Office Note:    Date:  07/03/2024   ID:  Brandy Cervantes, DOB 1946/01/16, MRN [id number]  PCP:  Dia Vina HERO, NP-C  Cardiologist:  Redell Leiter, MD    Referring MD: Dia Vina HERO, NP-C    ASSESSMENT:    1. CAD in native artery   2. Coronary artery vasospasm   3. Mixed hyperlipidemia   4. Statin intolerance   5. Essential hypertension    PLAN:    In order of problems listed above:  From a cardiology perspective she is doing well she is aspirin  intolerant will continue clopidogrel  oral mononitrate with her previous coronary artery spasm and intensity statin Lipids are at target continue rosuvastatin  there is a notation she is statin intolerant but she is tolerating it quite well BP at target presently not taking any hypertensive agents   Next appointment: 1 year   Medication Adjustments/Labs and Tests Ordered: Current medicines are reviewed at length with the patient today.  Concerns regarding medicines are outlined above.  Orders Placed This Encounter  Procedures   EKG 12-Lead   No orders of the defined types were placed in this encounter.    History of Present Illness:    Brandy Cervantes is a 78 y.o. female with a hx of CAD drug-eluting stent to the LAD 2019 subsequent recurrent angina due to coronary vasospasm hypertension hyperlipidemia and statin intolerance.  She had follow-up coronary angiography performed in March 2022 with patent stent to the LAD and mild nonflow flow-limiting stenosis right coronary artery last seen 04/04/2023.   Compliance with diet, lifestyle and medications: Yes  She had a recent noncardiac ED visit at [facility] a few days before she had brief momentary sharp left chest discomfort no other respiratory symptoms but she had a low-grade fever She is not having angina edema shortness of breath palpitation or syncope Recent lipid profile LDL 68 cholesterol 148 non-HDL 106  Past Medical History:  Diagnosis Date   Atrophic flaccid  tympanic membrane 10/18/2015   Bilateral primary osteoarthritis of knee 11/17/2016   Bronchiectasis without complication (HCC) 07/30/2019   Formatting of this note might be different from the original. Chest CT 06/20/19   Chest pain 03/03/2018   Chronic pain 10/18/2015   Corns of multiple toes 09/20/2018   Coronary artery disease involving native coronary artery of native heart with angina pectoris 02/04/2018   Degenerative disc disease, cervical 10/28/2013   Degenerative disc disease, lumbar 10/28/2013   Depression    Esophageal dysphagia 12/31/2017   Essential hypertension 10/18/2015   Exposure to TB    my mother died when she was 32 of TB   Forgetfulness 12/22/2019   GAD (generalized anxiety disorder) 10/18/2015   GERD (gastroesophageal reflux disease) 12/31/2017   History of angina 01/27/2018   History of gout    I've had it occasionally (11/23/2016)   Hypotension 07/24/2018   Insomnia 10/18/2015   Low back pain 10/28/2013   Migraine    a few/year; more in the last few weeks (01/29/2018)   Mixed hyperlipidemia 10/19/2017   Ocular migraine    Osteoarthritis 10/18/2015   Osteoporosis 10/18/2015   Other chest pain 12/31/2017   Pain syndrome, chronic 10/28/2013   Presence of drug coated stent in LAD coronary artery 02/07/2018   Progressive angina (HCC)    Raynaud's disease    feet   Recurrent major depressive disorder, in remission 01/08/2020   Screening for cervical cancer 04/11/2021   Formatting of this note might be different from the original.  04/11/2021: declined further testing, discussed   Screening for colon cancer 04/11/2021   Formatting of this note might be different from the original. 04/11/2021: declined further testing, discussed   TIA (transient ischemic attack) 11/27/2021   Unstable angina (HCC) 01/27/2018   Vaccination declined by patient 04/11/2021   Formatting of this note might be different from the original. 04/11/2021: all, discussed   Vision disturbance     Visual disturbances 11/28/2021    Current Medications: Active Medications[1]    EKGs/Labs/Other Studies Reviewed:    The following studies were reviewed today:  Cardiac Studies & Procedures   ______________________________________________________________________________________________ CARDIAC CATHETERIZATION  CARDIAC CATHETERIZATION 10/08/2020  Conclusion  The left ventricular systolic function is normal. The left ventricular ejection fraction is 55-65% by visual estimate.  LV end diastolic pressure is normal.  There is no aortic valve stenosis.  Prox LAD stent was widely patent.  Mid LAD lesion is 20% stenosed.  Prox RCA lesion is 20% stenosed.  Mid RCA lesion is 55% stenosed ->  SUMMARY  Widely patent LAD stent  Mild progression of disease in the mid RCA now more like 50-55% as opposed to 30% on last cath -> not flow-limiting.  Would not cause resting chest pain.  Normal LVEF and EDP.   RECOMMENDATIONS  Optimize medical management.  Consider prolonged coronary spasm, although there is no evidence to suggest spasm on cath.     Alm Clay, MD  Findings Coronary Findings Diagnostic  Dominance: Right  Left Anterior Descending Non-stenotic Prox LAD lesion was previously treated. The lesion is located at the major branch. Mid LAD lesion is 20% stenosed. The lesion is eccentric.  First Diagonal Branch Vessel is small in size.  Right Coronary Artery Prox RCA lesion is 20% stenosed. The lesion is focal and eccentric. Mid RCA lesion is 55% stenosed. The lesion is focal and eccentric. This lesion was previously described as being somewhat responsive to nitroglycerin .  Intervention  No interventions have been documented.   CARDIAC CATHETERIZATION  CARDIAC CATHETERIZATION 03/04/2018  Conclusion Conclusions: 1. Mild non-obstructive coronary artery disease.  An element of vasospasm is suspected given improvement in some lesions with intracoronary  nitroglycerin . 2. Widely patent proximal LAD stent, unchanged in appearance from 01/29/18. 3. Low LVEDP with normal LVEF.  Recommendations: 1. Increase isosorbide  mononitrate to 60 mg daily.  Continue amlodipine  10 mg daily, given suspected coronary vasospasm. 2. Complete at least 12 months of DAPT from time of PCI last month. 3. Could consider discharge home this afternoon if the patient is able to ambulate without further chest pain.  Recommend uninterrupted dual antiplatelet therapy with Aspirin  81mg  daily and Ticagrelor  90mg  twice daily for a minimum of 12 months (ACS - Class I recommendation).  Lonni Hanson, MD [facility] Pager: [phone]  Findings Coronary Findings Diagnostic  Dominance: Right  Left Anterior Descending Previously placed Prox LAD stent (unknown type) is widely patent. Mid LAD lesion is 20% stenosed. The lesion is eccentric.  First Diagonal Branch Vessel is small in size.  Right Coronary Artery Prox RCA lesion is 20% stenosed. The lesion is focal and eccentric. Mid RCA lesion is 30% stenosed. Lesion became more apparent following administration of intracoronary nitroglycerin  with overall vessel size increasing with nitroglycerin .  Residual stenosis may reflect continued spams versus fixed stenosis.  Intervention  No interventions have been documented.   STRESS TESTS  MYOCARDIAL PERFUSION IMAGING 09/13/2022  Interpretation Summary   The study is normal. The study is low risk.   Left ventricular function  is normal. Nuclear stress EF: 67 %. The left ventricular ejection fraction is hyperdynamic (>65%). End diastolic cavity size is normal.   Prior study not available for comparison.   ECHOCARDIOGRAM  ECHOCARDIOGRAM COMPLETE 02/13/2019  Narrative ECHOCARDIOGRAM REPORT    Patient Name:   Brandy Cervantes Date of Exam: 02/13/2019 Medical Rec #:  [id number]     Height:       63.5 in Accession #:    [id number]    Weight:       141.8 lb Date of  Birth:  Nov 04, 1945     BSA:          1.68 m Patient Age:    73 years      BP:           114/78 mmHg Patient Gender: F             HR:           74 bpm. Exam Location:  [place]   Procedure: 2D Echo  Indications:    Dyspnea 786.09 / R06.00  History:        Patient has no prior history of Echocardiogram examinations. CAD Pacemaker Signs/Symptoms: Chest Pain Risk Factors: Dyslipidemia.  Sonographer:    Lynwood Silvas Referring Phys: [phone] Aqueelah Cotrell J Autry Prust  IMPRESSIONS   1. The left ventricle has normal systolic function, with an ejection fraction of 55-60%. The cavity size was normal. There is concentric left ventricular hypertrophy. Left ventricular diastolic Doppler parameters are consistent with impaired relaxation. No evidence of left ventricular regional wall motion abnormalities. 2. The right ventricle has normal systolic function. The cavity was normal. There is no increase in right ventricular wall thickness. 3. The mitral valve is degenerative. There is mild mitral annular calcification present. No evidence of mitral valve stenosis. 4. The aortic valve is tricuspid. No stenosis of the aortic valve. 5. The aorta is normal in size and structure. 6. The aortic root and ascending aorta are normal in size and structure.  FINDINGS Left Ventricle: The left ventricle has normal systolic function, with an ejection fraction of 55-60%. The cavity size was normal. There is concentric left ventricular hypertrophy. Left ventricular diastolic Doppler parameters are consistent with impaired relaxation. Normal left ventricular filling pressures No evidence of left ventricular regional wall motion abnormalities..  Right Ventricle: The right ventricle has normal systolic function. The cavity was normal. There is no increase in right ventricular wall thickness.  Left Atrium: Left atrial size was normal in size.  Right Atrium: Right atrial size was normal in size. Right atrial pressure is estimated at  3 mmHg.  Interatrial Septum: No atrial level shunt detected by color flow Doppler.  Pericardium: There is no evidence of pericardial effusion.  Mitral Valve: The mitral valve is degenerative in appearance. There is mild mitral annular calcification present. Mitral valve regurgitation is not visualized by color flow Doppler. No evidence of mitral valve stenosis.  Tricuspid Valve: The tricuspid valve is normal in structure. Tricuspid valve regurgitation is mild by color flow Doppler.  Aortic Valve: The aortic valve is tricuspid Aortic valve regurgitation was not visualized by color flow Doppler. There is No stenosis of the aortic valve.  Pulmonic Valve: The pulmonic valve was normal in structure. Pulmonic valve regurgitation is not visualized by color flow Doppler. No evidence of pulmonic stenosis.  Aorta: The aortic root and ascending aorta are normal in size and structure. The aorta is normal in size and structure.  Venous: The inferior vena cava measures 1.50  cm, is normal in size with greater than 50% respiratory variability.   +--------------+--------++ LEFT VENTRICLE         +----------------+---------++ +--------------+--------++ Diastology                PLAX 2D                +----------------+---------++ +--------------+--------++ LV e' lateral:  6.74 cm/s LVIDd:        4.60 cm  +----------------+---------++ +--------------+--------++ LV E/e' lateral:8.1       LVIDs:        2.60 cm  +----------------+---------++ +--------------+--------++ LV e' medial:   4.24 cm/s LV PW:        1.10 cm  +----------------+---------++ +--------------+--------++ LV E/e' medial: 12.9      LV IVS:       1.10 cm  +----------------+---------++ +--------------+--------++ LVOT diam:    1.90 cm  +--------------+--------++ LV SV:        73 ml    +--------------+--------++ LV SV Index:  42.57    +--------------+--------++ LVOT Area:    2.84  cm +--------------+--------++                        +--------------+--------++  +---------------+---------++ RIGHT VENTRICLE          +---------------+---------++ RV S prime:    9.25 cm/s +---------------+---------++ TAPSE (M-mode):1.3 cm    +---------------+---------++  +---------------+-------++-----------++ LEFT ATRIUM           Index       +---------------+-------++-----------++ LA diam:       3.50 cm2.08 cm/m  +---------------+-------++-----------++ LA Vol (A2C):  50.0 ml29.75 ml/m +---------------+-------++-----------++ LA Vol (A4C):  39.6 ml23.56 ml/m +---------------+-------++-----------++ LA Biplane Vol:44.4 ml26.42 ml/m +---------------+-------++-----------++ +------------+---------++-----------++ RIGHT ATRIUM         Index       +------------+---------++-----------++ RA Area:    12.50 cm            +------------+---------++-----------++ RA Volume:  26.40 ml 15.71 ml/m +------------+---------++-----------++ +------------+-----------++ AORTIC VALVE            +------------+-----------++ LVOT Vmax:  97.50 cm/s  +------------+-----------++ LVOT Vmean: 67.500 cm/s +------------+-----------++ LVOT VTI:   0.206 m     +------------+-----------++  +-------------+-------++ AORTA                +-------------+-------++ Ao Root diam:3.10 cm +-------------+-------++ Ao Asc diam: 3.10 cm +-------------+-------++  +--------------+----------++ +---------------+-----------++ MITRAL VALVE             TRICUSPID VALVE            +--------------+----------++ +---------------+-----------++ MV Area (PHT):2.66 cm   TR Peak grad:  19.7 mmHg   +--------------+----------++ +---------------+-----------++ MV PHT:       82.65 msec TR Vmax:       225.00 cm/s +--------------+----------++ +---------------+-----------++ MV Decel Time:285 msec    +--------------+----------++ +--------------+-------+ +--------------+----------++ SHUNTS                MV E velocity:54.80 cm/s +--------------+-------+ +--------------+----------++ Systemic VTI: 0.21 m  MV A velocity:80.50 cm/s +--------------+-------+ +--------------+----------++ Systemic Diam:1.90 cm MV E/A ratio: 0.68       +--------------+-------+ +--------------+----------++  +---------+-------+ IVC              +---------+-------+ IVC diam:1.50 cm +---------+-------+   Redell Leiter MD Electronically signed by Redell Leiter MD Signature Date/Time: 02/13/2019/4:17:05 PM    Final          ______________________________________________________________________________________________      EKG Interpretation Date/Time:  Thursday  July 03 2024 08:05:07 EST Ventricular Rate:  91 PR Interval:  120 QRS Duration:  86 QT Interval:  396 QTC Calculation: 487 R Axis:   49  Text Interpretation: Normal sinus rhythm Nonspecific ST abnormality When compared with ECG of 13-Aug-2023 18:44, No significant change since last tracing Confirmed by Monetta Rogue ([id number]) on 07/03/2024 8:10:32 AM    Recent Labs: 08/13/2023: BUN 15; Creatinine, Ser 0.55; Hemoglobin 14.5; Platelets 354; Potassium 4.0; Sodium 139  Recent Lipid Panel    Component Value Date/Time   CHOL 236 (H) 09/11/2022 1443   TRIG 190 (H) 09/11/2022 1443   HDL 50 09/11/2022 1443   CHOLHDL 4.7 (H) 09/11/2022 1443   CHOLHDL 4.5 11/27/2021 1526   VLDL 57 (H) 11/27/2021 1526   LDLCALC 152 (H) 09/11/2022 1443    Physical Exam:    VS:  BP 118/84   Pulse 91   Ht 5' 2 (1.575 m)   Wt 130 lb 12.8 oz (59.3 kg)   SpO2 96%   BMI 23.92 kg/m     Wt Readings from Last 3 Encounters:  07/03/24 130 lb 12.8 oz (59.3 kg)  08/20/23 144 lb (65.3 kg)  08/13/23 138 lb (62.6 kg)     GEN:  Well nourished, well developed in no acute distress HEENT: Normal NECK: No JVD; No carotid  bruits LYMPHATICS: No lymphadenopathy CARDIAC: RRR, no murmurs, rubs, gallops RESPIRATORY:  Clear to auscultation without rales, wheezing or rhonchi  ABDOMEN: Soft, non-tender, non-distended MUSCULOSKELETAL:  No edema; No deformity  SKIN: Warm and dry NEUROLOGIC:  Alert and oriented x 3 PSYCHIATRIC:  Normal affect    Signed, Rogue Monetta, MD  07/03/2024 8:26 AM    [facility]      [1]  Current Meds  Medication Sig   hydrOXYzine  (VISTARIL ) 25 MG capsule Take 25 mg by mouth 4 (four) times daily as needed.   NARCAN 4 MG/0.1ML LIQD nasal spray kit Place 1 spray into the nose.   NEXLETOL  180 MG TABS Take 1 tablet by mouth daily.   PARoxetine  (PAXIL ) 20 MG tablet Take 20 mg by mouth every morning.

## 2024-07-03 ENCOUNTER — Ambulatory Visit: Payer: Self-pay | Attending: Cardiology | Admitting: Cardiology

## 2024-07-03 ENCOUNTER — Encounter: Payer: Self-pay | Admitting: Cardiology

## 2024-07-03 VITALS — BP 118/84 | HR 91 | Ht 62.0 in | Wt 130.8 lb

## 2024-07-03 DIAGNOSIS — I10 Essential (primary) hypertension: Secondary | ICD-10-CM

## 2024-07-03 DIAGNOSIS — I25111 Atherosclerotic heart disease of native coronary artery with angina pectoris with documented spasm: Secondary | ICD-10-CM

## 2024-07-03 DIAGNOSIS — I2510 Atherosclerotic heart disease of native coronary artery without angina pectoris: Secondary | ICD-10-CM

## 2024-07-03 DIAGNOSIS — E782 Mixed hyperlipidemia: Secondary | ICD-10-CM | POA: Diagnosis not present

## 2024-07-03 DIAGNOSIS — Z789 Other specified health status: Secondary | ICD-10-CM

## 2024-07-03 DIAGNOSIS — I201 Angina pectoris with documented spasm: Secondary | ICD-10-CM

## 2024-07-03 NOTE — Patient Instructions (Signed)
